# Patient Record
Sex: Male | Born: 1990 | Race: Black or African American | Hispanic: No | Marital: Single | Smoking: Current every day smoker
Health system: Southern US, Community
[De-identification: ages and names within clinical notes are randomized; demographics above are authoritative.]

## PROBLEM LIST (undated history)

## (undated) DIAGNOSIS — F32A Depression, unspecified: Secondary | ICD-10-CM

## (undated) DIAGNOSIS — F319 Bipolar disorder, unspecified: Secondary | ICD-10-CM

## (undated) DIAGNOSIS — J45909 Unspecified asthma, uncomplicated: Secondary | ICD-10-CM

## (undated) DIAGNOSIS — J189 Pneumonia, unspecified organism: Secondary | ICD-10-CM

## (undated) DIAGNOSIS — F329 Major depressive disorder, single episode, unspecified: Secondary | ICD-10-CM

## (undated) HISTORY — PX: NO PAST SURGERIES: SHX2092

---
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
## 2010-08-07 LAB — URINALYSIS, ROUTINE W REFLEX MICROSCOPIC
Bilirubin Urine: NEGATIVE
Hgb urine dipstick: NEGATIVE
Ketones, ur: NEGATIVE mg/dL
Protein, ur: 100 mg/dL — AB
Urobilinogen, UA: 1 mg/dL (ref 0.0–1.0)

## 2010-08-07 LAB — RPR: RPR Ser Ql: NONREACTIVE

## 2011-02-02 LAB — RAPID STREP SCREEN (MED CTR MEBANE ONLY): Streptococcus, Group A Screen (Direct): NEGATIVE

## 2014-01-12 ENCOUNTER — Emergency Department (HOSPITAL_COMMUNITY): Payer: Self-pay

## 2014-01-12 ENCOUNTER — Emergency Department (HOSPITAL_COMMUNITY)
Admission: EM | Admit: 2014-01-12 | Discharge: 2014-01-12 | Disposition: A | Discharge status: Other Acute Inpatient Hospital | Payer: Self-pay

## 2014-01-12 ENCOUNTER — Emergency Department (HOSPITAL_COMMUNITY)
Admission: EM | Admit: 2014-01-12 | Discharge: 2014-01-14 | Disposition: A | Discharge status: Other Acute Inpatient Hospital | Payer: Federal, State, Local not specified - Other | Attending: Emergency Medicine | Admitting: Emergency Medicine

## 2014-01-12 ENCOUNTER — Encounter (HOSPITAL_COMMUNITY): Payer: Self-pay | Admitting: Emergency Medicine

## 2014-01-12 DIAGNOSIS — Z880 Allergy status to penicillin: Secondary | ICD-10-CM | POA: Insufficient documentation

## 2014-01-12 DIAGNOSIS — F1910 Other psychoactive substance abuse, uncomplicated: Secondary | ICD-10-CM | POA: Insufficient documentation

## 2014-01-12 DIAGNOSIS — F17200 Nicotine dependence, unspecified, uncomplicated: Secondary | ICD-10-CM | POA: Insufficient documentation

## 2014-01-12 DIAGNOSIS — Z008 Encounter for other general examination: Secondary | ICD-10-CM | POA: Insufficient documentation

## 2014-01-12 DIAGNOSIS — IMO0002 Reserved for concepts with insufficient information to code with codable children: Secondary | ICD-10-CM | POA: Insufficient documentation

## 2014-01-12 DIAGNOSIS — T07XXXA Unspecified multiple injuries, initial encounter: Secondary | ICD-10-CM

## 2014-01-12 DIAGNOSIS — R45851 Suicidal ideations: Secondary | ICD-10-CM | POA: Insufficient documentation

## 2014-01-12 DIAGNOSIS — J45909 Unspecified asthma, uncomplicated: Secondary | ICD-10-CM | POA: Insufficient documentation

## 2014-01-12 HISTORY — DX: Unspecified asthma, uncomplicated: J45.909

## 2014-01-12 LAB — COMPREHENSIVE METABOLIC PANEL
ALBUMIN: 4.4 g/dL (ref 3.5–5.2)
ALK PHOS: 44 U/L (ref 39–117)
ALT: 24 U/L (ref 0–53)
ANION GAP: 16 — AB (ref 5–15)
AST: 36 U/L (ref 0–37)
BILIRUBIN TOTAL: 1.2 mg/dL (ref 0.3–1.2)
BUN: 13 mg/dL (ref 6–23)
CO2: 22 mEq/L (ref 19–32)
CREATININE: 1 mg/dL (ref 0.50–1.35)
Calcium: 9 mg/dL (ref 8.4–10.5)
Chloride: 99 mEq/L (ref 96–112)
GFR calc non Af Amer: >90 mL/min (ref >90)
GLUCOSE: 92 mg/dL (ref 70–99)
POTASSIUM: 3.7 meq/L (ref 3.7–5.3)
Sodium: 137 mEq/L (ref 137–147)
TOTAL PROTEIN: 7.8 g/dL (ref 6.0–8.3)

## 2014-01-12 LAB — CBC
HEMATOCRIT: 42 % (ref 39.0–52.0)
HEMOGLOBIN: 14.9 g/dL (ref 13.0–17.0)
MCH: 32.3 pg (ref 26.0–34.0)
MCHC: 35.5 g/dL (ref 30.0–36.0)
MCV: 90.9 fL (ref 78.0–100.0)
Platelets: 272 10*3/uL (ref 150–400)
RBC: 4.62 MIL/uL (ref 4.22–5.81)
RDW: 13.8 % (ref 11.5–15.5)
WBC: 14.5 10*3/uL — ABNORMAL HIGH (ref 4.0–10.5)

## 2014-01-12 LAB — ETHANOL

## 2014-01-12 LAB — RAPID URINE DRUG SCREEN, HOSP PERFORMED
Amphetamines: NOT DETECTED
BARBITURATES: NOT DETECTED
Benzodiazepines: NOT DETECTED
COCAINE: POSITIVE — AB
Opiates: NOT DETECTED
TETRAHYDROCANNABINOL: POSITIVE — AB

## 2014-01-12 LAB — ACETAMINOPHEN LEVEL: Acetaminophen (Tylenol), Serum: <15 ug/mL (ref 10–30)

## 2014-01-12 LAB — SALICYLATE LEVEL: Salicylate Lvl: <2 mg/dL — ABNORMAL LOW (ref 2.8–20.0)

## 2014-01-12 MED ORDER — HYDROCODONE-ACETAMINOPHEN 5-325 MG PO TABS
1.0000 | ORAL_TABLET | Freq: Once | ORAL | Status: AC
  Administered 2014-01-12: 1 via ORAL
  Filled 2014-01-12: qty 1

## 2014-01-12 MED ORDER — LORAZEPAM 1 MG PO TABS
1.0000 mg | ORAL_TABLET | Freq: Three times a day (TID) | ORAL | Status: DC | PRN
Start: 2014-01-12 — End: 2014-01-14

## 2014-01-12 MED ORDER — TETANUS-DIPHTH-ACELL PERTUSSIS 5-2.5-18.5 LF-MCG/0.5 IM SUSP
0.5000 mL | Freq: Once | INTRAMUSCULAR | Status: AC
  Administered 2014-01-12: 0.5 mL via INTRAMUSCULAR
  Filled 2014-01-12: qty 0.5

## 2014-01-12 MED ORDER — ACETAMINOPHEN 325 MG PO TABS
650.0000 mg | ORAL_TABLET | ORAL | Status: DC | PRN
  Administered 2014-01-12: 650 mg via ORAL
  Filled 2014-01-12: qty 2

## 2014-01-12 MED ORDER — NICOTINE 21 MG/24HR TD PT24
21.0000 mg | MEDICATED_PATCH | Freq: Every day | TRANSDERMAL | Status: DC
  Administered 2014-01-12 – 2014-01-14 (×3): 21 mg via TRANSDERMAL
  Filled 2014-01-12 (×3): qty 1

## 2014-01-12 MED ORDER — TETRACAINE HCL 0.5 % OP SOLN
2.0000 [drp] | Freq: Once | OPHTHALMIC | Status: AC
Start: 2014-01-12 — End: 2014-01-12
  Administered 2014-01-12: 2 [drp] via OPHTHALMIC
  Filled 2014-01-12: qty 2

## 2014-01-12 MED ORDER — ALUM & MAG HYDROXIDE-SIMETH 200-200-20 MG/5ML PO SUSP: 30.0000 mL | ORAL | Status: DC | PRN

## 2014-01-12 MED ORDER — ONDANSETRON HCL 4 MG PO TABS: 4.0000 mg | ORAL_TABLET | Freq: Three times a day (TID) | ORAL | Status: DC | PRN

## 2014-01-12 MED ORDER — ZOLPIDEM TARTRATE 5 MG PO TABS
5.0000 mg | ORAL_TABLET | Freq: Every evening | ORAL | Status: DC | PRN
  Administered 2014-01-12: 5 mg via ORAL
  Filled 2014-01-12: qty 1

## 2014-01-12 MED ORDER — IBUPROFEN 400 MG PO TABS
600.0000 mg | ORAL_TABLET | Freq: Three times a day (TID) | ORAL | Status: DC | PRN
  Administered 2014-01-13 – 2014-01-14 (×3): 600 mg via ORAL
  Filled 2014-01-12 (×6): qty 1

## 2014-01-12 MED ORDER — FLUORESCEIN SODIUM 1 MG OP STRP
1.0000 | ORAL_STRIP | Freq: Once | OPHTHALMIC | Status: AC
  Administered 2014-01-12: 1 via OPHTHALMIC
  Filled 2014-01-12: qty 1

## 2014-01-12 NOTE — ED Notes (Signed)
Pt in paper scrubs, wanded by security at triage and called for a sitter.
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
## 2014-01-12 NOTE — ED Provider Notes (Signed)
Medical screening examination/treatment/procedure(s) were performed by non-physician practitioner and as supervising physician I was immediately available for consultation/collaboration.   EKG Interpretation None        Brit Carbonell, MD 01/12/14 2334 

## 2014-01-13 NOTE — BH Assessment (Signed)
Assessment completed. Inpatient treatment is recommended. Fayrene Helper, PA-C has been notified of the recommendation.

## 2014-01-13 NOTE — BH Assessment (Signed)
Referrals faxed over for possible admission [facility]   The Following facilities were at Capacity:   [facility]- Per Tretha Sciara- Left Message   [facility]- Per Selena Batten   [facility]- Per Marlou [facility]- Per [facility]- Per Missy   Sharyne Richters- Per [facility]- Per [facility]- Per [facility]- Per Fredric Dine- Per Mary[facility]   [facility]- Per [facility]- Per Camie Patience-  Per [facility]- Per Sherron Monday- Per [facility]- Per Erasmo Score- Per Sherald Barge- Per Amy   [place]- [facility]- No Answer   [place]- Per [employer]
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
## 2014-01-13 NOTE — BH Assessment (Addendum)
This writer attempted F/U at the following facilities:   [facility]-left message on crisis line for someone to call TTS back regarding the status of referral faxed prior.   [facility]-Per Joni Reining pt does not meet criteria as pt is currently endorsing SI,HI, and AVH as noted in TTS assessment. [facility] is a SA treatment facility and do not accept patients whom are SI,HI, or endorsing AVH.  Patient is being reviewed by [facility] at [facility] for possible admission.  Currently no inpatient bed placement available.  LCSW will follow up.     Glorious Peach, MS, LCASA  Assessment Counselor   Mordecai Rasmussen, [place], MSW

## 2014-01-14 ENCOUNTER — Inpatient Hospital Stay (HOSPITAL_COMMUNITY)
Admission: EM | Admit: 2014-01-14 | Discharge: 2014-01-21 | DRG: 885 | Disposition: A | Discharge status: Home / Self Care | Payer: Federal, State, Local not specified - Other | Source: Intra-hospital | Attending: Psychiatry | Admitting: Psychiatry

## 2014-01-14 DIAGNOSIS — F329 Major depressive disorder, single episode, unspecified: Secondary | ICD-10-CM | POA: Diagnosis present

## 2014-01-14 DIAGNOSIS — Z23 Encounter for immunization: Secondary | ICD-10-CM

## 2014-01-14 DIAGNOSIS — R45851 Suicidal ideations: Secondary | ICD-10-CM

## 2014-01-14 DIAGNOSIS — F411 Generalized anxiety disorder: Secondary | ICD-10-CM | POA: Diagnosis present

## 2014-01-14 DIAGNOSIS — G4700 Insomnia, unspecified: Secondary | ICD-10-CM | POA: Diagnosis present

## 2014-01-14 DIAGNOSIS — IMO0002 Reserved for concepts with insufficient information to code with codable children: Secondary | ICD-10-CM | POA: Diagnosis present

## 2014-01-14 DIAGNOSIS — F1410 Cocaine abuse, uncomplicated: Secondary | ICD-10-CM | POA: Diagnosis present

## 2014-01-14 DIAGNOSIS — J45909 Unspecified asthma, uncomplicated: Secondary | ICD-10-CM | POA: Diagnosis present

## 2014-01-14 DIAGNOSIS — F3162 Bipolar disorder, current episode mixed, moderate: Principal | ICD-10-CM | POA: Diagnosis present

## 2014-01-14 DIAGNOSIS — R45850 Homicidal ideations: Secondary | ICD-10-CM

## 2014-01-14 DIAGNOSIS — F1210 Cannabis abuse, uncomplicated: Secondary | ICD-10-CM | POA: Diagnosis present

## 2014-01-14 DIAGNOSIS — F1910 Other psychoactive substance abuse, uncomplicated: Secondary | ICD-10-CM

## 2014-01-14 DIAGNOSIS — F1994 Other psychoactive substance use, unspecified with psychoactive substance-induced mood disorder: Secondary | ICD-10-CM

## 2014-01-14 DIAGNOSIS — F17200 Nicotine dependence, unspecified, uncomplicated: Secondary | ICD-10-CM | POA: Diagnosis present

## 2014-01-14 DIAGNOSIS — F332 Major depressive disorder, recurrent severe without psychotic features: Secondary | ICD-10-CM

## 2014-01-14 NOTE — ED Provider Notes (Signed)
Accepted by Dr. Elna Breslow at [facility]  Vida Roller, MD 01/14/14 2012
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
## 2014-01-14 NOTE — ED Notes (Signed)
PSYCH EVAL IN PROGRESS

## 2014-01-15 ENCOUNTER — Encounter (HOSPITAL_COMMUNITY): Payer: Self-pay

## 2014-01-15 DIAGNOSIS — R45850 Homicidal ideations: Secondary | ICD-10-CM

## 2014-01-15 DIAGNOSIS — F329 Major depressive disorder, single episode, unspecified: Secondary | ICD-10-CM | POA: Diagnosis present

## 2014-01-15 DIAGNOSIS — R45851 Suicidal ideations: Secondary | ICD-10-CM

## 2014-01-15 MED ORDER — BENZTROPINE MESYLATE 0.5 MG PO TABS
0.5000 mg | ORAL_TABLET | Freq: Two times a day (BID) | ORAL | Status: DC
  Administered 2014-01-15 – 2014-01-17 (×4): 0.5 mg via ORAL
  Filled 2014-01-15 (×8): qty 1

## 2014-01-15 MED ORDER — ALBUTEROL SULFATE HFA 108 (90 BASE) MCG/ACT IN AERS: 2.0000 | INHALATION_SPRAY | RESPIRATORY_TRACT | Status: DC | PRN

## 2014-01-15 MED ORDER — DIVALPROEX SODIUM ER 250 MG PO TB24
750.0000 mg | ORAL_TABLET | Freq: Every evening | ORAL | Status: DC
  Administered 2014-01-15: 750 mg via ORAL
  Filled 2014-01-15 (×2): qty 3

## 2014-01-15 MED ORDER — PNEUMOCOCCAL VAC POLYVALENT 25 MCG/0.5ML IJ INJ
0.5000 mL | INJECTION | INTRAMUSCULAR | Status: AC
  Administered 2014-01-16: 0.5 mL via INTRAMUSCULAR

## 2014-01-15 MED ORDER — PRAZOSIN HCL 1 MG PO CAPS
1.0000 mg | ORAL_CAPSULE | Freq: Every day | ORAL | Status: DC
  Administered 2014-01-16 – 2014-01-20 (×5): 1 mg via ORAL
  Filled 2014-01-15 (×7): qty 1

## 2014-01-15 MED ORDER — HALOPERIDOL 5 MG PO TABS
5.0000 mg | ORAL_TABLET | Freq: Two times a day (BID) | ORAL | Status: DC
  Administered 2014-01-15 – 2014-01-16 (×2): 5 mg via ORAL
  Filled 2014-01-15 (×4): qty 1

## 2014-01-15 MED ORDER — MAGNESIUM HYDROXIDE 400 MG/5ML PO SUSP: 30.0000 mL | Freq: Every day | ORAL | Status: DC | PRN

## 2014-01-15 MED ORDER — ALUM & MAG HYDROXIDE-SIMETH 200-200-20 MG/5ML PO SUSP: 30.0000 mL | ORAL | Status: DC | PRN

## 2014-01-15 MED ORDER — BACITRACIN-NEOMYCIN-POLYMYXIN 400-5-5000 EX OINT
TOPICAL_OINTMENT | Freq: Two times a day (BID) | CUTANEOUS | Status: DC
  Administered 2014-01-15: 15 via TOPICAL
  Administered 2014-01-15: 1 via TOPICAL
  Administered 2014-01-16: 15 via TOPICAL
  Administered 2014-01-17 – 2014-01-20 (×4): via TOPICAL
  Filled 2014-01-15 (×15): qty 1

## 2014-01-15 MED ORDER — IBUPROFEN 200 MG PO TABS
600.0000 mg | ORAL_TABLET | Freq: Four times a day (QID) | ORAL | Status: DC | PRN
  Administered 2014-01-16 – 2014-01-20 (×4): 600 mg via ORAL
  Filled 2014-01-15 (×4): qty 3

## 2014-01-15 MED ORDER — TRAZODONE HCL 50 MG PO TABS
50.0000 mg | ORAL_TABLET | Freq: Every evening | ORAL | Status: DC | PRN
  Administered 2014-01-15 – 2014-01-20 (×10): 50 mg via ORAL
  Filled 2014-01-15 (×16): qty 1

## 2014-01-15 MED ORDER — TRIAMCINOLONE ACETONIDE 0.1 % MT PSTE
PASTE | Freq: Three times a day (TID) | OROMUCOSAL | Status: DC
  Administered 2014-01-15 – 2014-01-18 (×6): via OROMUCOSAL
  Filled 2014-01-15: qty 5

## 2014-01-15 MED ORDER — ACETAMINOPHEN 325 MG PO TABS
650.0000 mg | ORAL_TABLET | Freq: Four times a day (QID) | ORAL | Status: DC | PRN
  Administered 2014-01-15 – 2014-01-20 (×4): 650 mg via ORAL
  Filled 2014-01-15 (×4): qty 2

## 2014-01-15 MED ORDER — TRAZODONE HCL 50 MG PO TABS
50.0000 mg | ORAL_TABLET | Freq: Every evening | ORAL | Status: DC | PRN
Start: 2014-01-15 — End: 2014-01-15

## 2014-01-15 MED ORDER — HYDROXYZINE HCL 25 MG PO TABS
25.0000 mg | ORAL_TABLET | Freq: Four times a day (QID) | ORAL | Status: DC | PRN
Start: 2014-01-15 — End: 2014-01-21
  Administered 2014-01-15 – 2014-01-20 (×5): 25 mg via ORAL
  Filled 2014-01-15 (×5): qty 1

## 2014-01-15 NOTE — Progress Notes (Signed)
Did not attend group 
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
## 2014-01-15 NOTE — [facility] Group Notes (Signed)
[facility] LCSW Group Therapy  01/15/2014 1:56 PM  Type of Therapy:  Group Therapy  Participation Level:  Active  Participation Quality:  Appropriate  Affect:  Depressed  Cognitive:  Alert  Insight:  Limited  Engagement in Therapy: Limited  Modes of Intervention:  Socialization, Support, Education  Summary of Progress/Problems:Mental Health Association (MHA) speaker came to talk about his personal journey with substance abuse and mental illness. Group members were challenged to process ways by which to relate to the speaker. MHA speaker provided handouts and educational information pertaining to groups and services offered by the [facility]. Pt appeared to be engaged during the presentation. He discussed musical instruments with the presenter.     Hyatt,Candace 01/15/2014, 1:56 PM

## 2014-01-15 NOTE — Tx Team (Addendum)
Initial Interdisciplinary Treatment Plan   PATIENT STRESSORS: Marital or family conflict Substance abuse Financial Problems, legal issues   PROBLEM LIST: Problem List/Patient Goals Date to be addressed Date deferred Reason deferred Estimated date of resolution  Risk for suicide 01/15/14     [facility] 01/15/14                                                DISCHARGE CRITERIA:  Medical problems require only outpatient monitoring  PRELIMINARY DISCHARGE PLAN: Attend aftercare/continuing care group  PATIENT/FAMIILY INVOLVEMENT: This treatment plan has been presented to and reviewed with the patient, Raymond Church.  The patient and family have been given the opportunity to ask questions and make suggestions.  Jacques Navy A 01/15/2014, 5:10 AM
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
## 2014-01-15 NOTE — Progress Notes (Signed)
D:  Per pt self inventory pt reports sleeping fair, appetite good, energy level high, ability to pay attention good, rates depression at a 4 out of 10 and hopelessness at an 8 out of 10 denies SI, endorses HI and AH--MD aware and started pt on Risperdal/Cogentin, Pt set a goal for himself today to "tell myself to keep pushing"     A:  Emotional support provided, Encouraged pt to continue with treatment plan and attend all group activities, q15 min checks maintained for safety.  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

## 2014-01-15 NOTE — BHH Suicide Risk Assessment (Signed)
   Nursing information obtained from:    Demographic factors:    Current Mental Status:    Loss Factors:    Historical Factors:    Risk Reduction Factors:    Total Time spent with patient: 20 minutes  CLINICAL FACTORS:   Depression:   Comorbid alcohol abuse/dependence Hopelessness Impulsivity Insomnia Alcohol/Substance Abuse/Dependencies Currently Psychotic  Psychiatric Specialty Exam: Physical Exam  Nursing note and vitals reviewed. Constitutional: He is oriented to person, place, and time. He appears well-developed and well-nourished.  HENT:  Head: Normocephalic.  Eyes: Conjunctivae are normal. Pupils are equal, round, and reactive to light.  Neck: Normal range of motion.  Cardiovascular: Normal rate.   Respiratory: Effort normal.  GI: Soft.  Musculoskeletal: Normal range of motion.  Neurological: He is alert and oriented to person, place, and time.  Skin: Skin is warm.  Psychiatric: His speech is normal. His mood appears anxious. His affect is angry and labile. He is actively hallucinating. Thought content is paranoid. Cognition and memory are normal. He expresses impulsivity. He exhibits a depressed mood. He expresses homicidal and suicidal ideation.    Review of Systems  Constitutional: Negative.   HENT: Negative.   Eyes: Negative.   Respiratory: Negative.   Cardiovascular: Negative.   Gastrointestinal: Negative.   Genitourinary: Negative.   Musculoskeletal: Negative.   Skin: Negative.   Neurological: Negative.   Psychiatric/Behavioral: Positive for depression, suicidal ideas, hallucinations and substance abuse. The patient is nervous/anxious and has insomnia.     Blood pressure 110/68, pulse 95, temperature 97.6 F (36.4 C), temperature source Oral, resp. rate 20, height 5' 6.75" (1.695 m), weight 61.689 kg (136 lb).Body mass index is 21.47 kg/(m^2).   For mental status examination -please see H&P    COGNITIVE FEATURES THAT CONTRIBUTE TO RISK:   Closed-mindedness Polarized thinking Thought constriction (tunnel vision)    SUICIDE RISK:   Moderate:  Frequent suicidal ideation with limited intensity, and duration, some specificity in terms of plans, no associated intent, good self-control, limited dysphoria/symptomatology, some risk factors present, and identifiable protective factors, including available and accessible social support.  PLAN OF CARE:  I certify that inpatient services furnished can reasonably be expected to improve the patient's condition.  Elyan Vanwieren 01/15/2014, 1:10 PM

## 2014-01-15 NOTE — BHH Group Notes (Signed)
[facility] LCSW Aftercare Discharge Planning Group Note   01/15/2014 12:48 PM  Participation Quality:  Attended, but no interaction as he was called out to see Dr prior to sharing.  Was not disruptive.    Daryel Gerald B
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
## 2014-01-16 DIAGNOSIS — F3160 Bipolar disorder, current episode mixed, unspecified: Secondary | ICD-10-CM

## 2014-01-16 MED ORDER — HALOPERIDOL 5 MG PO TABS
10.0000 mg | ORAL_TABLET | Freq: Two times a day (BID) | ORAL | Status: DC
  Administered 2014-01-16 – 2014-01-21 (×9): 10 mg via ORAL
  Filled 2014-01-16 (×12): qty 2

## 2014-01-16 MED ORDER — DIVALPROEX SODIUM ER 500 MG PO TB24
1000.0000 mg | ORAL_TABLET | Freq: Every evening | ORAL | Status: DC
  Administered 2014-01-16 – 2014-01-20 (×5): 1000 mg via ORAL
  Filled 2014-01-16 (×6): qty 2

## 2014-01-16 MED ORDER — NICOTINE 21 MG/24HR TD PT24
21.0000 mg | MEDICATED_PATCH | Freq: Every day | TRANSDERMAL | Status: DC
  Administered 2014-01-16 – 2014-01-20 (×5): 21 mg via TRANSDERMAL
  Filled 2014-01-16 (×7): qty 1

## 2014-01-16 NOTE — Plan of Care (Signed)
Problem: Alteration in mood; excessive anxiety as evidenced by: Goal: STG-Pt will report an absence of self-harm thoughts/actions (Patient will report an absence of self-harm thoughts or actions)  Outcome: Progressing Pt slept well through the night and denies thoughts of self harm.

## 2014-01-16 NOTE — Progress Notes (Signed)
Patient ID: Raymond Church, male   DOB: [birth year], 23 y.o.   MRN: [id number] D: Patient sleeping since beginning of shift. Pt did not come for medication. Writer try to wake pt but refuse to wake up. Respiration regular and unlabored A: 15 mins checks for safety. R: Patient  is safe.

## 2014-01-16 NOTE — Consult Note (Signed)
Case discussed, and agree with plan 

## 2014-01-16 NOTE — BHH Group Notes (Signed)
Adult Psychoeducational Group Note  Date:  01/16/2014 Time:  11:03 AM  Group Topic/Focus:  Self Care:   The focus of this group is to help patients understand the importance of self-care in order to improve or restore emotional, physical, spiritual, interpersonal, and financial health.  Participation Level:  Did Not Attend  Alfonse Spruce 01/16/2014, 11:03 AM

## 2014-01-16 NOTE — Progress Notes (Signed)
D: Patient endorses passive SI and AH but contracts for safety.  He appears anxious but is flat and minimally interactive this afternoon.  Pt. Is pleasant during med pass but forwards little.  He is otherwise pleasant and cooperative with staff and others on the unit.  A: Patient given emotional support from RN. Patient encouraged to come to staff with concerns and/or questions. Patient's medication routine continued. Patient's orders and plan of care reviewed.   R: Patient remains appropriate and cooperative. Will continue to monitor patient q15 minutes for safety.

## 2014-01-16 NOTE — Progress Notes (Signed)
Adult Psychoeducational Group Note  Date:  01/16/2014 Time:  10:33 PM  Group Topic/Focus:  Wrap-Up Group:   The focus of this group is to help patients review their daily goal of treatment and discuss progress on daily workbooks.  Participation Level:  Did Not Attend  Participation Quality:  Drowsy  Affect:  Flat  Cognitive:  Confused  Insight: None  Engagement in Group:  None  Modes of Intervention:  None  Additional Comments:  Pt was not a group  Lakelyn Straus R 01/16/2014, 10:33 PM
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
## 2014-01-17 MED ORDER — DIPHENHYDRAMINE HCL 50 MG/ML IJ SOLN
50.0000 mg | Freq: Once | INTRAMUSCULAR | Status: AC
  Administered 2014-01-17: 50 mg via INTRAMUSCULAR
  Filled 2014-01-17: qty 1

## 2014-01-17 MED ORDER — BENZTROPINE MESYLATE 2 MG PO TABS
2.0000 mg | ORAL_TABLET | Freq: Two times a day (BID) | ORAL | Status: DC
  Administered 2014-01-17 – 2014-01-21 (×7): 2 mg via ORAL
  Filled 2014-01-17 (×9): qty 1

## 2014-01-17 MED ORDER — BENZTROPINE MESYLATE 1 MG PO TABS
ORAL_TABLET | ORAL | Status: AC
  Filled 2014-01-17: qty 2

## 2014-01-17 MED ORDER — DIPHENHYDRAMINE HCL 50 MG/ML IJ SOLN
INTRAMUSCULAR | Status: AC
Start: 2014-01-17 — End: 2014-01-17
  Administered 2014-01-17: 50 mg via INTRAMUSCULAR
  Filled 2014-01-17: qty 1
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
## 2014-01-17 NOTE — [facility] Group Notes (Signed)
[facility] LCSW Group Therapy  01/17/2014 2:14 PM  Type of Therapy:  Group Therapy  Participation Level: Invited.  Did Not Attend :  Raymond Church,Raymond Church 01/17/2014, 2:14 PM

## 2014-01-17 NOTE — [facility] Group Notes (Signed)
[facility] LCSW Aftercare Discharge Planning Group Note   01/17/2014 10:15 AM  Participation Quality:  Invited  Chose to not attend "I feel like I am going to fall whenever I get up."    [place], Raymond Church

## 2014-01-17 NOTE — Progress Notes (Signed)
Patient ID: Raymond Church, male   DOB: 1990-12-17, 23 y.o.   MRN: [id number] D. Patient presents with depressed mood, affect blunted. Raymond Church reports that he ''feels better. '' He denies any current AH/VH but remains depressed, isolative and withdrawn on the unit. He has been in his bed throughout most of shift thus far, only leaving room to come to medication window. He did report feeling dizzy with low bp this am - he was encouraged to drink fluids and gatorade provided. He refused to complete self inventory today. A. Medications given as ordered. Support and encouragement provided. Discussed above information with Dr. Elna Breslow. R. Patient is safe on the unit. He denies any acute concerns at this time and appears in no acute distress. Will continue to monitor q 15 minutes for safety at this time.

## 2014-01-17 NOTE — Progress Notes (Signed)
Patient ID: Raymond Church, male   DOB: [birth year], 23 y.o.   MRN: [id number] Pt presented to medication window with very stiff jaw - unable to open mouth with altered speech stating '' my jaw is so tight in my muscles, I can't open my mouth. whats wrong with me.'' Pt with suspected EPS reaction . Notified Dr. Jannifer Franklin of above information and RN assessment/patient reports. Orders received for benadryl  IM once, and increase cogentin to 2 mg BID. Orders also received to delay scheduled dose of haldol and depakote at this time to move to bedtime due to suspected EPS. Patient accepted injection, educated to notify staff if no relief. Will continue to monitor q 15 minutes for safety.

## 2014-01-17 NOTE — Progress Notes (Signed)
Patient ID: Raymond Church, male   DOB: June 09, 1990, 23 y.o.   MRN: [id number] D: Pt denies SI/HI/AVH. Pt stated he feels tired and does not want to be bothered. Pt mood and affect appeared depressed and flat. Pt sleeping most of the evening but came out for snack and medication.  Cooperative with assessment. No acute distressed noted at this time.   A: Met with pt 1:1. Medications administered as prescribed. Writer encouraged pt to discuss feelings. Pt encouraged to come to staff with any question or concerns.   R: Patient remains safe. He is complaint with medications and denies any adverse reaction. Continue current POC.

## 2014-01-17 NOTE — Plan of Care (Signed)
Problem: Diagnosis: Increased Risk For Suicide Attempt Goal: STG-Patient Will Report Suicidal Feelings to Staff Outcome: Progressing Pt denies suicidal ideation
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
## 2014-01-17 NOTE — Progress Notes (Signed)
[facility] Group Notes:  (Nursing/MHT/Case Management/Adjunct)  Date:  01/17/2014  Time:  9:34 PM  Type of Therapy:  Group Therapy  Participation Level:  Active  Participation Quality:  Appropriate and Sharing  Affect:  Appropriate  Cognitive:  Appropriate  Insight:  Appropriate  Engagement in Group:  Engaged  Modes of Intervention:  Socialization and Support  Summary of Progress/Problems: Pt. Stated play musical instruments (durms) and interacting with pets.  Pt. Stated his support system is his family children for relapse prevention.  Sondra Come 01/17/2014, 9:34 PM

## 2014-01-18 DIAGNOSIS — J45909 Unspecified asthma, uncomplicated: Secondary | ICD-10-CM

## 2014-01-18 DIAGNOSIS — F1410 Cocaine abuse, uncomplicated: Secondary | ICD-10-CM

## 2014-01-18 DIAGNOSIS — F17200 Nicotine dependence, unspecified, uncomplicated: Secondary | ICD-10-CM

## 2014-01-18 DIAGNOSIS — F3162 Bipolar disorder, current episode mixed, moderate: Principal | ICD-10-CM

## 2014-01-18 DIAGNOSIS — F1210 Cannabis abuse, uncomplicated: Secondary | ICD-10-CM

## 2014-01-18 NOTE — Progress Notes (Signed)
Patient ID: Raymond Church, male   DOB: Jul 10, 1990, 23 y.o.   MRN: [id number] [facility] MD Progress Note  01/18/2014 3:38 PM KOICHI PLATTE  MRN:  [id number] Subjective: Patient states  Raymond Church reports, "I'm feeling fine. When can I get out of here"  Objective: Patient seen and chart reviewed. Raymond Church says he is feeling well. Says his discharge plan is to maintain mood stability by staying focus. He denies SI/HI today. He is compliant on medications. Denies side effects. Plays the piano.  Per staff patient has been attending groups.   Diagnosis:   DSM5: Primary psychiatric diagnosis:  Bipolar disorder ,Type I , current episode ,with  Mixed features,moderate   Secondary psychiatric diagnosis:  Stimulant (cocaine) use disorder  Marijuana use disorder  Tobacco use disorder   Non psychiatric diagnosis:  Asthma  Total Time spent with patient: 30 minutes   ADL's:  Impaired  Sleep: Fair  Appetite:  Fair  Psychiatric Specialty Exam: Physical Exam  Constitutional: He is oriented to person, place, and time. He appears well-developed and well-nourished.  HENT:  Head: Normocephalic and atraumatic.  Eyes: Conjunctivae are normal. Pupils are equal, round, and reactive to light.  Neck: Normal range of motion. Neck supple.  Cardiovascular: Normal rate and regular rhythm.   Respiratory: Effort normal.  GI: Soft.  Musculoskeletal: Normal range of motion.  Neurological: He is alert and oriented to person, place, and time.  Skin: Skin is warm.  Psychiatric: His speech is normal. His mood appears anxious. Cognition and memory are normal. He expresses impulsivity. He exhibits a depressed mood. He expresses no homicidal and no suicidal ideation.    Review of Systems  Constitutional: Negative.   HENT: Negative.   Eyes: Negative.   Respiratory: Negative.   Cardiovascular: Negative.   Gastrointestinal: Negative.   Genitourinary: Negative.   Musculoskeletal: Positive for myalgias (improving).   Skin: Negative.   Neurological: Negative.   Psychiatric/Behavioral: Positive for depression and substance abuse. Negative for suicidal ideas. The patient is nervous/anxious.     Blood pressure 103/54, pulse 110, temperature 97.6 F (36.4 C), temperature source Oral, resp. rate 18, height 5' 6.75" (1.695 m), weight 61.689 kg (136 lb), SpO2 100.00%.Body mass index is 21.47 kg/(m^2).  General Appearance: Fairly Groomed  Patent attorney::  Fair  Speech:  Normal Rate  Volume:  Normal  Mood:  Anxious, Depressed and Hopeless  Affect:  Labile  Thought Process:  Goal Directed  Orientation:  Full (Time, Place, and Person)  Thought Content:  Hallucinations: Auditory  Suicidal Thoughts:  No  Homicidal Thoughts:  No  Memory:  Immediate;   Good Recent;   Good Remote;   Good  Judgement:  Impaired  Insight:  Lacking  Psychomotor Activity:  Normal  Concentration:  Good  Recall:  Fair  Fund of Knowledge:Fair  Language: Fair  Akathisia:  No    AIMS (if indicated):   0  Assets:  Communication Skills Desire for Improvement  Sleep:  Number of Hours: 6.75   Musculoskeletal: Strength & Muscle Tone: within normal limits Gait & Station: normal Patient leans: [name]  Current Medications: Current Facility-Administered Medications  Medication Dose Route Frequency Provider Last Rate Last Dose  . acetaminophen (TYLENOL) tablet 650 mg  650 mg Oral Q6H PRN Kerry Hough, PA-C   650 mg at 01/15/14 0127  . albuterol (PROVENTIL HFA;VENTOLIN HFA) 108 (90 BASE) MCG/ACT inhaler 2 puff  2 puff Inhalation Q4H PRN Jomarie Longs, MD      . alum & mag  hydroxide-simeth (MAALOX/MYLANTA) 200-200-20 MG/5ML suspension 30 mL  30 mL Oral Q4H PRN Kerry Hough, PA-C      . benztropine (COGENTIN) tablet 2 mg  2 mg Oral BID Mojeed Akintayo   2 mg at 01/18/14 1332  . divalproex (DEPAKOTE ER) 24 hr tablet 1,000 mg  1,000 mg Oral QPM Saramma Eappen, MD   1,000 mg at 01/17/14 2216  . haloperidol (HALDOL) tablet 10 mg  10 mg  Oral BID Jomarie Longs, MD   10 mg at 01/18/14 1332  . hydrOXYzine (ATARAX/VISTARIL) tablet 25 mg  25 mg Oral Q6H PRN Kerry Hough, PA-C   25 mg at 01/15/14 1653  . ibuprofen (ADVIL,MOTRIN) tablet 600 mg  600 mg Oral Q6H PRN Kerry Hough, PA-C   600 mg at 01/18/14 0029  . magnesium hydroxide (MILK OF MAGNESIA) suspension 30 mL  30 mL Oral Daily PRN Kerry Hough, PA-C      . neomycin-bacitracin-polymyxin (NEOSPORIN) ointment   Topical BID Kerry Hough, PA-C      . nicotine (NICODERM CQ - dosed in mg/24 hours) patch 21 mg  21 mg Transdermal Daily Saramma Eappen, MD   21 mg at 01/18/14 1331  . prazosin (MINIPRESS) capsule 1 mg  1 mg Oral QHS Jomarie Longs, MD   1 mg at 01/17/14 2216  . traZODone (DESYREL) tablet 50 mg  50 mg Oral QHS,MR X 1 Spencer E Simon, PA-C   50 mg at 01/18/14 0030  . triamcinolone (KENALOG) 0.1 % paste   Mouth/Throat TID Sanjuana Kava, NP        Lab Results: No results found for this or any previous visit (from the past 48 hour(s)).  Physical Findings: AIMS: Facial and Oral Movements Muscles of Facial Expression: None, normal Lips and Perioral Area: None, normal Jaw: None, normal Tongue: None, normal,Extremity Movements Upper (arms, wrists, hands, fingers): None, normal Lower (legs, knees, ankles, toes): None, normal, Trunk Movements Neck, shoulders, hips: None, normal, Overall Severity Severity of abnormal movements (highest score from questions above): None, normal Incapacitation due to abnormal movements: None, normal Patient's awareness of abnormal movements (rate only patient's report): No Awareness, Dental Status Current problems with teeth and/or dentures?: Yes (cavities) Does patient usually wear dentures?: No  CIWA:  CIWA-Ar Total: 0 COWS:  COWS Total Score: 1  Treatment Plan Summary: Daily contact with patient to assess and evaluate symptoms and progress in treatment Medication management  Assessment and Plan: Continue inpatient treatment  and stabilization.  Continue current treatment pan in place. Discharge plan in progress.  Medical Decision Making Problem Points:  Established problem, stable/improving (1) and Review of last therapy session (1) Data Points:  Order Aims Assessment (2) Review of medication regiment & side effects (2)  I certify that inpatient services furnished can reasonably be expected to improve the patient's condition.   Sanjuana Kava, PMHNP 01/18/2014, 3:38 PM  I agreed with the findings, treatment and disposition plan of this patient. Kathryne Sharper, MD

## 2014-01-18 NOTE — [facility] Group Notes (Signed)
[facility] Group Notes:  (Nursing/MHT/Case Management/Adjunct)  Date:  01/18/2014  Time:  11:54 AM  Type of Therapy:  Psychoeducational Skills-Patient Self Inventory  Participation Level:  Did Not Attend  Buford Dresser 01/18/2014, 11:54 AM

## 2014-01-18 NOTE — [facility] Group Notes (Signed)
[facility] Group Notes: (Clinical Social Work)   01/18/2014      Type of Therapy:  Group Therapy   Participation Level:  Did Not Attend - refused MHT request   Ambrose Mantle, LCSW 01/18/2014, 1:21 PM

## 2014-01-18 NOTE — Progress Notes (Signed)
Patient ID: Raymond Church, male   DOB: 09-24-1990, 23 y.o.   MRN: [id number]   D: Pt has been very flat and depressed on the unit today. Pt was in the bed most of the morning, and refused to take morning medications. Pt did wake up at lunch and requested his medications, all medications were given without any problems. Pt also has a abrasion to right elbow, new dressing applied. Pt reported that he felt much better, however appeared to have some involuntary movements. Pt reported being negative SI/HI, no AH/VH noted. A: 15 min checks continued for patient safety. R: Pt safety maintained.

## 2014-01-18 NOTE — Progress Notes (Signed)
Patient ID: Raymond Church, male   DOB: 1991-04-08, 23 y.o.   MRN: [id number]  D: Pt informed the writer that he's ready to see his children. Stated he has 3 kids and the oldest is 8.  Stated he feels better today than previous days, due to his medication. However, pt stated his meds are making him feel "ansy".   A:  Support and encouragement was offered. 15 min checks continued for safety.  R: Pt remains safe.

## 2014-01-18 NOTE — [facility] Group Notes (Signed)
[facility] Group Notes:  (Nursing/MHT/Case Management/Adjunct)  Date:  01/18/2014  Time:  11:54 AM  Type of Therapy:  Psychoeducational Skills- Healthy Coping Skills  Participation Level:  Did Not Attend  Raymond Church 01/18/2014, 11:54 AM

## 2014-01-18 NOTE — Progress Notes (Signed)
Patient ID: Raymond Church, male   DOB: January 22, 1991, 23 y.o.   MRN: [id number] D: Pt denies SI/HI/AVH. Pt in dayroom playing the piano. Pt mood and affect is brighter and patient is interacting well with peers. Pt reports relieve from benadryl given earlier for suspected EPS.  Cooperative with assessment. No acute distressed noted at this time.   A: Met with pt 1:1. Medications administered as prescribed. Writer encouraged pt to discuss feelings. Pt encouraged to come to staff with any question or concerns.   R: Patient remains safe. He is complaint with medications and denies any adverse reaction. Continue current POC.

## 2014-01-18 NOTE — Progress Notes (Signed)
[facility] Group Notes:  (Nursing/MHT/Case Management/Adjunct)  Date:  01/18/2014  Time:  9:07 PM  Type of Therapy:  Psychoeducational Skills  Participation Level:  Active  Participation Quality:  Appropriate  Affect:  Appropriate  Cognitive:  Appropriate  Insight:  Good  Engagement in Group:  Engaged  Modes of Intervention:  Education  Summary of Progress/Problems: The patient described his day as having been "up and down". The patient verbalized that his medication is causing him to fidget quite a bit. He states that he is unable to sit still. He also mentioned that he enjoys talking to others and that it helps to get through his days. As a theme for the day, his coping skills are as follows: think positively, and talk to his peers.   Hazle Coca S 01/18/2014, 9:07 PM

## 2014-01-19 NOTE — Progress Notes (Signed)
Patient ID: Raymond Church, male   DOB: [birth year], 23 y.o.   MRN: [id number] D)  Was awake with c/o coughing, requested something for cough and to help with sleep.  Was given vistaril and cepacol losenge.  Also requested dsg to be changed on rt elbow after shower.  Old dsg removed, neosporin and telfa dsg to rt elbow for abrasion.  No other c/o's voiced, went back to bed. A)  Will continue to monitor for safety, continue POC R)  Safety maintained.

## 2014-01-19 NOTE — [facility] Group Notes (Signed)
[facility] Group Notes:  (Nursing/MHT/Case Management/Adjunct)  Date:  01/19/2014  Time:  11:30 AM  Type of Therapy:  Psychoeducational Skills- Patient Self Inventory Group  Participation Level:  Did Not Attend   Type of Therapy:  Psychoeducational Skills-Healthy Support Systems  Participation Level:  Did Not Attend   Raymond Church 01/19/2014, 11:30 AM 

## 2014-01-19 NOTE — [facility] Group Notes (Signed)
[facility] Group Notes: (Clinical Social Work)   01/19/2014      Type of Therapy:  Group Therapy   Participation Level:  Did Not Attend - declined MHT request to attend group   Ambrose Mantle, LCSW 01/19/2014, 1:11 PM

## 2014-01-19 NOTE — Progress Notes (Signed)
Patient ID: Raymond Church, male   DOB: [birth year], 23 y.o.   MRN: [id number] Patient ID: Raymond Church, male   DOB: [birth year], 23 y.o.   MRN: [id number] [facility] MD Progress Note  01/19/2014 3:51 PM Raymond Church  MRN:  [id number] Subjective: Patient states  Orva reports, "I'm okay for the most part. I'm just ready to go home"  Objective: Patient seen and chart reviewed. Trayson says he is feeling well. Says his discharge plan is to maintain mood stability by staying focus. He denies SI/HI today. He is compliant with his medications. Denies side effects. Plays the piano.  Per staff patient has been attending groups.   Diagnosis:   DSM5: Primary psychiatric diagnosis:  Bipolar disorder ,Type I , current episode ,with  Mixed features,moderate   Secondary psychiatric diagnosis:  Stimulant (cocaine) use disorder  Marijuana use disorder  Tobacco use disorder   Non psychiatric diagnosis:  Asthma  Total Time spent with patient: 30 minutes   ADL's:  Impaired  Sleep: Fair  Appetite:  Fair  Psychiatric Specialty Exam: Physical Exam  Constitutional: He is oriented to person, place, and time. He appears well-developed and well-nourished.  HENT:  Head: Normocephalic and atraumatic.  Eyes: Conjunctivae are normal. Pupils are equal, round, and reactive to light.  Neck: Normal range of motion. Neck supple.  Cardiovascular: Normal rate and regular rhythm.   Respiratory: Effort normal.  GI: Soft.  Musculoskeletal: Normal range of motion.  Neurological: He is alert and oriented to person, place, and time.  Skin: Skin is warm.  Psychiatric: His speech is normal. His mood appears anxious. Cognition and memory are normal. He expresses impulsivity. He exhibits a depressed mood. He expresses no homicidal and no suicidal ideation.    Review of Systems  Constitutional: Negative.   HENT: Negative.   Eyes: Negative.   Respiratory: Negative.   Cardiovascular: Negative.   Gastrointestinal:  Negative.   Genitourinary: Negative.   Musculoskeletal: Positive for myalgias (improving).  Skin: Negative.   Neurological: Negative.   Psychiatric/Behavioral: Positive for depression and substance abuse. Negative for suicidal ideas. The patient is nervous/anxious.     Blood pressure 112/70, pulse 63, temperature 98.1 F (36.7 C), temperature source Oral, resp. rate 16, height 5' 6.75" (1.695 m), weight 61.689 kg (136 lb), SpO2 100.00%.Body mass index is 21.47 kg/(m^2).  General Appearance: Fairly Groomed  Patent attorney::  Fair  Speech:  Normal Rate  Volume:  Normal  Mood:  Stable  Affect:  Flat  Thought Process:  Goal Directed  Orientation:  Full (Time, Place, and Person)  Thought Content:  Rumination  Suicidal Thoughts:  No  Homicidal Thoughts:  No  Memory:  Immediate;   Good Recent;   Good Remote;   Good  Judgement:  Impaired  Insight:  Lacking  Psychomotor Activity:  Normal  Concentration:  Good  Recall:  Fair  Fund of Knowledge:Fair  Language: Fair  Akathisia:  No    AIMS (if indicated):   0  Assets:  Communication Skills Desire for Improvement  Sleep:  Number of Hours: 5.25   Musculoskeletal: Strength & Muscle Tone: within normal limits Gait & Station: normal Patient leans: [name]  Current Medications: Current Facility-Administered Medications  Medication Dose Route Frequency Provider Last Rate Last Dose  . acetaminophen (TYLENOL) tablet 650 mg  650 mg Oral Q6H PRN Kerry Hough, PA-C   650 mg at 01/15/14 0127  . albuterol (PROVENTIL HFA;VENTOLIN HFA) 108 (90 BASE) MCG/ACT inhaler 2 puff  2 puff  Inhalation Q4H PRN Jomarie Longs, MD      . alum & mag hydroxide-simeth (MAALOX/MYLANTA) 200-200-20 MG/5ML suspension 30 mL  30 mL Oral Q4H PRN Kerry Hough, PA-C      . benztropine (COGENTIN) tablet 2 mg  2 mg Oral BID Mojeed Akintayo   2 mg at 01/19/14 0807  . divalproex (DEPAKOTE ER) 24 hr tablet 1,000 mg  1,000 mg Oral QPM Saramma Eappen, MD   1,000 mg at 01/18/14  1811  . haloperidol (HALDOL) tablet 10 mg  10 mg Oral BID Jomarie Longs, MD   10 mg at 01/19/14 0807  . hydrOXYzine (ATARAX/VISTARIL) tablet 25 mg  25 mg Oral Q6H PRN Kerry Hough, PA-C   25 mg at 01/19/14 0002  . ibuprofen (ADVIL,MOTRIN) tablet 600 mg  600 mg Oral Q6H PRN Kerry Hough, PA-C   600 mg at 01/18/14 0029  . magnesium hydroxide (MILK OF MAGNESIA) suspension 30 mL  30 mL Oral Daily PRN Kerry Hough, PA-C      . neomycin-bacitracin-polymyxin (NEOSPORIN) ointment   Topical BID Kerry Hough, PA-C      . nicotine (NICODERM CQ - dosed in mg/24 hours) patch 21 mg  21 mg Transdermal Daily Saramma Eappen, MD   21 mg at 01/19/14 0806  . prazosin (MINIPRESS) capsule 1 mg  1 mg Oral QHS Jomarie Longs, MD   1 mg at 01/18/14 2120  . traZODone (DESYREL) tablet 50 mg  50 mg Oral QHS,MR X 1 Kerry Hough, PA-C   50 mg at 01/18/14 2201  . triamcinolone (KENALOG) 0.1 % paste   Mouth/Throat TID Sanjuana Kava, NP        Lab Results: No results found for this or any previous visit (from the past 48 hour(s)).  Physical Findings: AIMS: Facial and Oral Movements Muscles of Facial Expression: None, normal Lips and Perioral Area: None, normal Jaw: None, normal Tongue: None, normal,Extremity Movements Upper (arms, wrists, hands, fingers): None, normal Lower (legs, knees, ankles, toes): None, normal, Trunk Movements Neck, shoulders, hips: None, normal, Overall Severity Severity of abnormal movements (highest score from questions above): None, normal Incapacitation due to abnormal movements: None, normal Patient's awareness of abnormal movements (rate only patient's report): No Awareness, Dental Status Current problems with teeth and/or dentures?: Yes (cavities) Does patient usually wear dentures?: No  CIWA:  CIWA-Ar Total: 0 COWS:  COWS Total Score: 1  Treatment Plan Summary: Daily contact with patient to assess and evaluate symptoms and progress in treatment Medication  management  Assessment and Plan: Continue inpatient treatment and stabilization.  Continue current treatment plan in place. Discharge plan in progress.  Medical Decision Making Problem Points:  Established problem, stable/improving (1) and Review of last therapy session (1) Data Points:  Order Aims Assessment (2) Review of medication regiment & side effects (2)  I certify that inpatient services furnished can reasonably be expected to improve the patient's condition.   Armandina Stammer I, PMHNP 01/19/2014, 3:51 PM.   I agreed with the findings, treatment and disposition plan of this patient. Kathryne Sharper, MD

## 2014-01-19 NOTE — Progress Notes (Signed)
[facility] Group Notes:  (Nursing/MHT/Case Management/Adjunct)  Date:  01/19/2014  Time:  10:29 PM  Type of Therapy:  Psychoeducational Skills  Participation Level:  Active  Participation Quality:  Attentive  Affect:  Depressed  Cognitive:  Lacking  Insight:  Lacking  Engagement in Group:  Developing/Improving  Modes of Intervention:  Education  Summary of Progress/Problems: The patient shared in group that he had a great day and is looking forward to getting discharged. Very few additional details were provided. The patient did mention that his sister is very supportive of him (theme of the day) and that he would not be in the hospital seeking treatment had it not been for her. As a theme for the day, his support system will be made up of his girl friends, sister, god sister, and grandmother.   Hazle Coca S 01/19/2014, 10:29 PM

## 2014-01-19 NOTE — Progress Notes (Signed)
Patient ID: Raymond Church, male   DOB: 1991-04-08, 23 y.o.   MRN: [id number]   D: Pt continues to be very flat and depressed on the unit, however patient was more alert today. Pt did not attend any groups, and did not engage in treatment much. Pt also did not interact much with staff or peers, would only talk to one male patient on the unit. Pt and male patient had to be redirected several times because they were hanging out around each others room doors. Pt reported being negative SI/HI, no AH/VH noted. A: 15 min checks continued for patient safety. R: Pt safety maintained.

## 2014-01-20 NOTE — [facility] Group Notes (Signed)
[facility] LCSW Group Therapy  01/20/2014 1:15 pm  Type of Therapy: Process Group Therapy  Participation Level:  Active  Participation Quality:  Appropriate  Affect:  Flat  Cognitive:  Oriented  Insight:  Improving  Engagement in Group:  Limited  Engagement in Therapy:  Limited  Modes of Intervention:  Activity, Clarification, Education, Problem-solving and Support  Summary of Progress/Problems: Today's group addressed the issue of overcoming obstacles.  Patients were asked to identify their biggest obstacle post d/c that stands in the way of their on-going success, and then problem solve as to how to manage this.  Raymond Church was engaged throughout, with the exception of one brief break.  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  He identified no current obstacles for himself, but gave a lot of advice to others that involved practicing patience and turning problems over to a higher power, along with other philosophical phrases such as "if you love something let it go, and if it comes back you were meant to be together."  Raymond Church 01/20/2014   3:58 PM

## 2014-01-20 NOTE — Progress Notes (Signed)
Patient ID: Raymond Church, male   DOB: 1990/07/31, 23 y.o.   MRN: [id number] D)  Has been out and about on the unit this evening, bright, pleasant, interacting appropriately with staff and peers.  Is focused on discharge, states misses his 3 kids,  Hoping to leave tomorrow.  Attended group this evening, participated, stated his day was great, again talked about leaving.  Stated he needs to get back to work to earn some money to support his family, states he gets a lot of support from his sister,  His grandmother, his kids and their mother. Was compliant with hs meds, played the piano after group and has been in good spirits.   \A)  Will continue to monitor for safety, continue POC, support R)  Safety maintained.

## 2014-01-20 NOTE — Progress Notes (Signed)
Patient ID: Raymond Church, male   DOB: 21-Dec-1990, 23 y.o.   MRN: [id number] Patient ID: Raymond Church, male   DOB: [birth year], 23 y.o.   MRN: [id number] [facility] MD Progress Note  01/20/2014 12:39 PM Raymond Church  MRN:  [id number] Subjective: Patient states  Raymond Church reports, "I'm okay "  Objective: Patient seen and chart reviewed. Raymond Church says he is feeling well. His depression is improving and his moods are quiet stable. He reports sleep as improved and denies any nightmares or flashbacks. Denies SI/HI today. He is compliant with his medications. Denies side effects. Patient seen in day room playing the piano and doing a good job with it.  Per staff patient has been attending groups.   Diagnosis:   DSM5: Primary psychiatric diagnosis:  Bipolar disorder ,Type I , current episode ,with  Mixed features,moderate   Secondary psychiatric diagnosis:  Stimulant (cocaine) use disorder  Marijuana use disorder  Tobacco use disorder   Non psychiatric diagnosis:  Asthma  Total Time spent with patient: 30 minutes   ADL's:  Impaired  Sleep: Fair  Appetite:  Fair  Psychiatric Specialty Exam: Physical Exam  Constitutional: He is oriented to person, place, and time. He appears well-developed and well-nourished.  HENT:  Head: Normocephalic and atraumatic.  Eyes: Conjunctivae are normal. Pupils are equal, round, and reactive to light.  Neck: Normal range of motion. Neck supple.  Cardiovascular: Normal rate and regular rhythm.   Respiratory: Effort normal.  GI: Soft.  Musculoskeletal: Normal range of motion.  Neurological: He is alert and oriented to person, place, and time.  Skin: Skin is warm.  Psychiatric: His speech is normal. His mood appears anxious. Cognition and memory are normal. He expresses impulsivity. He exhibits a depressed mood (improving). He expresses no homicidal and no suicidal ideation.    Review of Systems  Constitutional: Negative.   HENT: Negative.   Eyes: Negative.    Respiratory: Negative.   Cardiovascular: Negative.   Gastrointestinal: Negative.   Genitourinary: Negative.   Musculoskeletal: Negative for myalgias (improved).  Skin: Negative.   Neurological: Negative.   Psychiatric/Behavioral: Positive for depression (improving) and substance abuse. Negative for suicidal ideas. The patient is nervous/anxious.     Blood pressure 98/58, pulse 91, temperature 97.7 F (36.5 C), temperature source Oral, resp. rate 16, height 5' 6.75" (1.695 m), weight 61.689 kg (136 lb), SpO2 100.00%.Body mass index is 21.47 kg/(m^2).  General Appearance: Fairly Groomed  Patent attorney::  Fair  Speech:  Normal Rate  Volume:  Normal  Mood:  Stable  Affect:  Flat  Thought Process:  Goal Directed  Orientation:  Full (Time, Place, and Person)  Thought Content:  Rumination  Suicidal Thoughts:  No  Homicidal Thoughts:  No  Memory:  Immediate;   Good Recent;   Good Remote;   Good  Judgement:  Impaired  Insight:  Lacking  Psychomotor Activity:  Normal  Concentration:  Good  Recall:  Fair  Fund of Knowledge:Fair  Language: Fair  Akathisia:  No    AIMS (if indicated):   0  Assets:  Communication Skills Desire for Improvement  Sleep:  Number of Hours: 6.5   Musculoskeletal: Strength & Muscle Tone: within normal limits Gait & Station: normal Patient leans: [name]  Current Medications: Current Facility-Administered Medications  Medication Dose Route Frequency Provider Last Rate Last Dose  . acetaminophen (TYLENOL) tablet 650 mg  650 mg Oral Q6H PRN Raymond Hough, PA-C   650 mg at 01/20/14 4696  . albuterol (  PROVENTIL HFA;VENTOLIN HFA) 108 (90 BASE) MCG/ACT inhaler 2 puff  2 puff Inhalation Q4H PRN Jomarie Longs, MD      . alum & mag hydroxide-simeth (MAALOX/MYLANTA) 200-200-20 MG/5ML suspension 30 mL  30 mL Oral Q4H PRN Raymond Hough, PA-C      . benztropine (COGENTIN) tablet 2 mg  2 mg Oral BID Mojeed Akintayo   2 mg at 01/20/14 0756  . divalproex (DEPAKOTE ER)  24 hr tablet 1,000 mg  1,000 mg Oral QPM Brendan Gadson, MD   1,000 mg at 01/19/14 1717  . haloperidol (HALDOL) tablet 10 mg  10 mg Oral BID Jomarie Longs, MD   10 mg at 01/20/14 0756  . hydrOXYzine (ATARAX/VISTARIL) tablet 25 mg  25 mg Oral Q6H PRN Raymond Hough, PA-C   25 mg at 01/19/14 0002  . ibuprofen (ADVIL,MOTRIN) tablet 600 mg  600 mg Oral Q6H PRN Raymond Hough, PA-C   600 mg at 01/20/14 1100  . magnesium hydroxide (MILK OF MAGNESIA) suspension 30 mL  30 mL Oral Daily PRN Raymond Hough, PA-C      . neomycin-bacitracin-polymyxin (NEOSPORIN) ointment   Topical BID Raymond Hough, PA-C      . nicotine (NICODERM CQ - dosed in mg/24 hours) patch 21 mg  21 mg Transdermal Daily Jomarie Longs, MD   21 mg at 01/20/14 0824  . prazosin (MINIPRESS) capsule 1 mg  1 mg Oral QHS Jomarie Longs, MD   1 mg at 01/19/14 2109  . traZODone (DESYREL) tablet 50 mg  50 mg Oral QHS,MR X 1 Raymond Hough, PA-C   50 mg at 01/19/14 2147  . triamcinolone (KENALOG) 0.1 % paste   Mouth/Throat TID Sanjuana Kava, NP        Lab Results: No results found for this or any previous visit (from the past 48 hour(s)).  Physical Findings: AIMS: Facial and Oral Movements Muscles of Facial Expression: None, normal Lips and Perioral Area: None, normal Jaw: None, normal Tongue: None, normal,Extremity Movements Upper (arms, wrists, hands, fingers): None, normal Lower (legs, knees, ankles, toes): None, normal, Trunk Movements Neck, shoulders, hips: None, normal, Overall Severity Severity of abnormal movements (highest score from questions above): None, normal Incapacitation due to abnormal movements: None, normal Patient's awareness of abnormal movements (rate only patient's report): No Awareness, Dental Status Current problems with teeth and/or dentures?: Yes (cavities) Does patient usually wear dentures?: No  CIWA:  CIWA-Ar Total: 0 COWS:  COWS Total Score: 1  Treatment Plan Summary: Daily contact with patient  to assess and evaluate symptoms and progress in treatment Medication management  Assessment and Plan: Continue inpatient treatment and stabilization.  Will continue Haldol 10 mg po bid for psychosis. Will continue Cogentin 2 mg po bid for EPS. Will continue Depakote ER 1000 mg po daily for mood lability. Will order Depakote level for tomorrow AM. Will continue Prazosin 1 mg po qhs for nightmares and continue Trazodone 50 mg po qhs for sleep. Discharge plan in progress.  Medical Decision Making Problem Points:  Established problem, stable/improving (1) and Review of last therapy session (1) Data Points:  Order Aims Assessment (2) Review of medication regiment & side effects (2)  I certify that inpatient services furnished can reasonably be expected to improve the patient's condition.   Trygg Mantz MD 01/20/2014, 12:39 PM.

## 2014-01-20 NOTE — Progress Notes (Signed)
D: Pt presents with flat affect and depressed mood. Pt rates depression 3/10. Per pt, he is ready to go home and visit with his three children. Pt compliant with taking meds and attending groups. No adverse reaction to meds verbalized by pt. No SI/HI/AVH verbalized by pt. Pt plans to go live with his "baby mama" once he is discharged and f/u with [facility].  A: Medications administered as ordered per MD. Verbal support given. Pt encouraged to attend groups. 15 minute checks performed for safety.  R: Pt receptive to treatment. Pt safety maintained at this time.

## 2014-01-20 NOTE — Tx Team (Signed)
  Interdisciplinary Treatment Plan Update   Date Reviewed:  01/20/2014  Time Reviewed:  8:19 AM  Progress in Treatment:   Attending groups: Yes Participating in groups: Yes Taking medication as prescribed: Yes  Tolerating medication: Yes Family/Significant other contact made: Yes  Patient understands diagnosis: Yes  Discussing patient identified problems/goals with staff: Yes  See initial care plan Medical problems stabilized or resolved: Yes Denies suicidal/homicidal ideation: Yes  In tx team Patient has not harmed self or others: Yes  For review of initial/current patient goals, please see plan of care.  Estimated Length of Stay:  Likely d/c tomorrow  Reason for Continuation of Hospitalization:   New Problems/Goals identified:  [name]  Discharge Plan or Barriers:   return home, follow up outpt  Additional Comments:  Attendees:  Signature: Ivin Booty, MD 01/20/2014 8:19 AM   Signature: Richelle Ito, LCSW 01/20/2014 8:19 AM  Signature: Fransisca Kaufmann, NP 01/20/2014 8:19 AM  Signature: Joslyn Devon, RN 01/20/2014 8:19 AM  Signature: Liborio Nixon, RN 01/20/2014 8:19 AM  Signature:  01/20/2014 8:19 AM  Signature:   01/20/2014 8:19 AM  Signature:    Signature:    Signature:    Signature:    Signature:    Signature:      Scribe for Treatment Team:   Richelle Ito, LCSW  01/20/2014 8:19 AM

## 2014-01-20 NOTE — [facility] Group Notes (Signed)
[facility] LCSW Aftercare Discharge Planning Group Note   01/20/2014 10:31 AM  Participation Quality:  Appropriate   Mood/Affect:  Appropriate  Depression Rating:  5  Anxiety Rating:  0  Thoughts of Suicide:  No Will you contract for safety?   NA  Current AVH:  No  Plan for Discharge/Comments:  Pt reports that he plans to d/c Tuesday and live with the mother of his son. He plans to follow-up at [facility] for med management. Merril shared that he does not feel anxious but is working through grief and guilt due to his mother's death when he was a teenager. Pt reports that he feels like other patients are his family and stated that he had a good weekend playing basketball.   Transportation Means: bus pass-"I'm going straight to [place] when I leave tomorrow." Pt's sister will pick him up from [place].   Supports: Pensions consultant, Oncologist

## 2014-01-20 NOTE — Progress Notes (Signed)
Patient ID: ANTWYNE PINGREE, male   DOB: March 25, 1991, 23 y.o.   MRN: [id number] D: client visible on the hall, reports "a little anxious, about me being here ready to go home" client denies AVH. Reports pain in left jaw at "9" of 10. A: Writer introduced self to client, administered Acetaminophen 650 mg for pain. Staff will monitor q29min for safety. R: client is safe on the unit, attended group.

## 2014-01-21 LAB — VALPROIC ACID LEVEL: Valproic Acid Lvl: 58.8 ug/mL (ref 50.0–100.0)

## 2014-01-21 MED ORDER — HALOPERIDOL 10 MG PO TABS: 10.0000 mg | ORAL_TABLET | Freq: Two times a day (BID) | ORAL | Status: DC

## 2014-01-21 MED ORDER — DIVALPROEX SODIUM ER 500 MG PO TB24: 1000.0000 mg | ORAL_TABLET | Freq: Every evening | ORAL | Status: DC

## 2014-01-21 MED ORDER — PRAZOSIN HCL 1 MG PO CAPS: 1.0000 mg | ORAL_CAPSULE | Freq: Every day | ORAL | Status: DC

## 2014-01-21 MED ORDER — TRAZODONE HCL 50 MG PO TABS: 50.0000 mg | ORAL_TABLET | Freq: Every evening | ORAL | Status: DC | PRN

## 2014-01-21 MED ORDER — BENZTROPINE MESYLATE 2 MG PO TABS: 2.0000 mg | ORAL_TABLET | Freq: Two times a day (BID) | ORAL | Status: DC
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
## 2014-01-21 NOTE — Discharge Summary (Signed)
Patient was seen face to face for psychiatric evaluation, suicide risk assessment and case discussed with treatment team and NP and made appropriate disposition plans. Reviewed the information documented and agree with the treatment plan.   Olubunmi Rothenberger ,MD Attending Psychiatrist  [facility]    

## 2014-01-21 NOTE — Psychosocial Assessment (Signed)
Writer reviewed pt discharge instructions with pt including medications, follow up care and crisis intervention. Pt acknowledged understanding of instructions and states that he has no reservations about leaving [facility] at this time. Pt denies SI/HI and AVH. Pt mood and affect are appropriate to the situation. Writer returned pt belongings from locker, and the pt is released into his own care.

## 2014-01-21 NOTE — Progress Notes (Signed)
[facility] Adult Case Management Discharge Plan :  Will you be returning to the same living situation after discharge: Yes,  home At discharge, do you have transportation home?:Yes,  family Do you have the ability to pay for your medications:Yes,  mental health  Release of information consent forms completed and in the chart;  Patient's signature needed at discharge.  Patient to Follow up at: Follow-up Information   Follow up with [facility]. (Go to the walk-in clinic M-F between 8 and 10 AM for your hospital follow up appointment)    Contact information:   [address]  [place]  [336] [phone]      Patient denies SI/HI:   Yes,  yes    Safety Planning and Suicide Prevention discussed:  Yes,  yes  Ida Rogue 01/21/2014, 10:17 AM
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
## 2014-01-21 NOTE — Progress Notes (Signed)
Adult Psychoeducational Group Note  Date:  01/21/2014 Time:  4:24 AM  Group Topic/Focus:  Wrap-Up Group:   The focus of this group is to help patients review their daily goal of treatment and discuss progress on daily workbooks.  Participation Level:  Active  Participation Quality:  Appropriate  Affect:  Irritable  Cognitive:  Appropriate  Insight: Good  Engagement in Group:  Engaged  Modes of Intervention:  Socialization and Support  Additional Comments:  Patient attended and participated in group tonight. He reports that his day today was interactive. He woke up today with the intension to go home, but he has to wait until tomorrow. He had to exercise patience. The patient advised that his experience here has changed his life. He now understand his illness. He now know why he become anxious, why he is hyper and why he become depress.  He also learn what to look for when these symptoms starts it onset and what to do in order to control himself. He described his day as being powerful.  Lita Mains [facility] 01/21/2014, 4:24 AM

## 2014-01-23 NOTE — Progress Notes (Signed)
Patient Discharge Instructions:  After Visit Summary (AVS):   Faxed to:  01/23/14 Discharge Summary Note:   Faxed to:  01/23/14 Psychiatric Admission Assessment Note:   Faxed to:  01/23/14 Suicide Risk Assessment - Discharge Assessment:   Faxed to:  01/23/14 Faxed/Sent to the Next Level Care provider:  01/23/14 Faxed to [facility] @ [phone]  Jerelene Redden, 01/23/2014, 3:53 PM

## 2014-01-26 ENCOUNTER — Emergency Department (HOSPITAL_COMMUNITY)
Admission: EM | Admit: 2014-01-26 | Discharge: 2014-01-26 | Disposition: A | Discharge status: Home / Self Care | Payer: Self-pay | Attending: Emergency Medicine | Admitting: Emergency Medicine

## 2014-01-26 ENCOUNTER — Encounter (HOSPITAL_COMMUNITY): Payer: Self-pay | Admitting: Emergency Medicine

## 2014-01-26 DIAGNOSIS — Z792 Long term (current) use of antibiotics: Secondary | ICD-10-CM | POA: Insufficient documentation

## 2014-01-26 DIAGNOSIS — F17200 Nicotine dependence, unspecified, uncomplicated: Secondary | ICD-10-CM | POA: Insufficient documentation

## 2014-01-26 DIAGNOSIS — K0889 Other specified disorders of teeth and supporting structures: Secondary | ICD-10-CM

## 2014-01-26 DIAGNOSIS — Z880 Allergy status to penicillin: Secondary | ICD-10-CM | POA: Insufficient documentation

## 2014-01-26 DIAGNOSIS — Z79899 Other long term (current) drug therapy: Secondary | ICD-10-CM | POA: Insufficient documentation

## 2014-01-26 DIAGNOSIS — K089 Disorder of teeth and supporting structures, unspecified: Secondary | ICD-10-CM | POA: Insufficient documentation

## 2014-01-26 DIAGNOSIS — J45909 Unspecified asthma, uncomplicated: Secondary | ICD-10-CM | POA: Insufficient documentation

## 2014-01-26 MED ORDER — CLINDAMYCIN HCL 150 MG PO CAPS: 300.0000 mg | ORAL_CAPSULE | Freq: Three times a day (TID) | ORAL | Status: DC

## 2014-01-26 MED ORDER — OXYCODONE-ACETAMINOPHEN 5-325 MG PO TABS
1.0000 | ORAL_TABLET | Freq: Once | ORAL | Status: AC
  Administered 2014-01-26: 1 via ORAL
  Filled 2014-01-26: qty 1

## 2014-01-26 MED ORDER — NAPROXEN 500 MG PO TABS: 500.0000 mg | ORAL_TABLET | Freq: Two times a day (BID) | ORAL | Status: DC

## 2014-01-26 MED ORDER — HYDROCODONE-ACETAMINOPHEN 5-325 MG PO TABS: 1.0000 | ORAL_TABLET | Freq: Four times a day (QID) | ORAL | Status: DC | PRN

## 2014-01-26 NOTE — ED Notes (Signed)
The pt is c/o a toothache  For 2 days.

## 2014-01-26 NOTE — ED Provider Notes (Signed)
Medical screening examination/treatment/procedure(s) were performed by non-physician practitioner and as supervising physician I was immediately available for consultation/collaboration.   EKG Interpretation None        Layla Maw Shravan Salahuddin, DO 01/26/14 [phone]

## 2014-01-26 NOTE — ED Provider Notes (Signed)
CSN: [id number]     Arrival date & time 01/26/14  0549 History   First MD Initiated Contact with Patient 01/26/14 0559     Chief Complaint  Patient presents with  . Dental Pain    (Consider location/radiation/quality/duration/timing/severity/associated sxs/prior Treatment) Patient is a 23 y.o. male presenting with tooth pain. The history is provided by the patient. No language interpreter was used.  Dental Pain Location:  Lower Lower teeth location:  28/RL 1st bicuspid, 29/RL 2nd bicuspid and 31/RL 2nd molar Quality:  Aching and throbbing Severity:  Mild Onset quality:  Gradual Timing:  Constant Progression:  Worsening Chronicity:  New Context: dental caries, dental fracture and poor dentition   Context: not trauma   Relieved by:  Acetaminophen Worsened by:  Touching and jaw movement Associated symptoms: no difficulty swallowing, no drooling, no facial swelling, no fever, no oral bleeding, no oral lesions and no trismus   Risk factors: lack of dental care and smoking     Past Medical History  Diagnosis Date  . Asthma    History reviewed. No pertinent past surgical history. Family History  Problem Relation Age of Onset  . Bipolar disorder Mother   . Drug abuse Mother    History  Substance Use Topics  . Smoking status: Current Every Day Smoker -- 0.25 packs/day for 10 years    Types: Cigarettes  . Smokeless tobacco: Not on file  . Alcohol Use: Yes    Review of Systems  Constitutional: Negative for fever.  HENT: Positive for dental problem. Negative for drooling, facial swelling and mouth sores.   All other systems reviewed and are negative.   Allergies  Shellfish allergy; Amoxicillin; Mushroom extract complex; and Penicillins  Home Medications   Prior to Admission medications   Medication Sig Start Date End Date Taking? Authorizing Provider  benztropine (COGENTIN) 2 MG tablet Take 1 tablet (2 mg total) by mouth 2 (two) times daily. 01/21/14   Fransisca Kaufmann, NP    clindamycin (CLEOCIN) 150 MG capsule Take 2 capsules (300 mg total) by mouth 3 (three) times daily. May dispense as  capsules 01/26/14   Antony Madura, PA-C  divalproex (DEPAKOTE ER) 500 MG 24 hr tablet Take 2 tablets (1,000 mg total) by mouth every evening. 01/21/14   Fransisca Kaufmann, NP  haloperidol (HALDOL) 10 MG tablet Take 1 tablet (10 mg total) by mouth 2 (two) times daily. 01/21/14   Fransisca Kaufmann, NP  HYDROcodone-acetaminophen (NORCO/VICODIN) 5-325 MG per tablet Take 1 tablet by mouth every 6 (six) hours as needed for moderate pain or severe pain. 01/26/14   Antony Madura, PA-C  naproxen (NAPROSYN) 500 MG tablet Take 1 tablet (500 mg total) by mouth 2 (two) times daily. 01/26/14   Antony Madura, PA-C  prazosin (MINIPRESS) 1 MG capsule Take 1 capsule (1 mg total) by mouth at bedtime. 01/21/14   Fransisca Kaufmann, NP  traZODone (DESYREL) 50 MG tablet Take 1 tablet (50 mg total) by mouth at bedtime and may repeat dose one time if needed. 01/21/14   Fransisca Kaufmann, NP   BP 109/60  Pulse 73  Temp(Src) 98 F (36.7 C) (Oral)  Resp 18  Ht  (1.626 m)  Wt 147 lb (66.679 kg)  BMI 25.22 kg/m2  SpO2 100%  Physical Exam  Nursing note and vitals reviewed. Constitutional: He is oriented to person, place, and time. He appears well-developed and well-nourished. No distress.  Nontoxic/nonseptic appearing  HENT:  Head: Normocephalic and atraumatic.  Right Ear: External ear normal.  Left Ear: External ear normal.  Mouth/Throat: Uvula is midline, oropharynx is clear and moist and mucous membranes are normal. No oral lesions. No trismus in the jaw. Abnormal dentition.    No trismus. Uvula midline. Patient tolerating secretions without difficulty.  Eyes: Conjunctivae and EOM are normal. No scleral icterus.  Neck: Normal range of motion. Neck supple.  Pulmonary/Chest: Effort normal. No respiratory distress.  Musculoskeletal: Normal range of motion.  Neurological: He is alert and oriented to person, place, and time.   Skin: Skin is warm and dry. No rash noted. He is not diaphoretic. No erythema. No pallor.  Psychiatric: He has a normal mood and affect. His behavior is normal.    ED Course  Procedures (including critical care time) Labs Review Labs Reviewed - No data to display  Imaging Review No results found.   EKG Interpretation None      MDM   Final diagnoses:  Dentalgia    Patient with toothache. No gross abscess. Exam unconcerning for Ludwig's angina or spread of infection. Will treat with clindamycin and pain medicine. Urged patient to follow-up with dentist. Resource guide and referral provided. Patient agreeable to plan with no unaddressed concerns.   Filed Vitals:   01/26/14 0553 01/26/14 0630 01/26/14 0638  BP: 124/80 109/60   Pulse: 76 73   Temp: 97.4 F (36.3 C)  98 F (36.7 C)  TempSrc: Oral  Oral  Resp: 18    Height:  (1.626 m)    Weight: 147 lb (66.679 kg)    SpO2: 100% 100%        Antony Madura, PA-C 01/26/14 0730

## 2014-01-26 NOTE — Discharge Instructions (Signed)
Take Clindamycin as prescribed. Take Naproxen for pain. Take Norco for severe pain as needed. Follow up with a dentist for further evaluation of symptoms.  Dental Pain A tooth ache may be caused by cavities (tooth decay). Cavities expose the nerve of the tooth to air and hot or cold temperatures. It may come from an infection or abscess (also called a boil or furuncle) around your tooth. It is also often caused by dental caries (tooth decay). This causes the pain you are having. DIAGNOSIS  Your caregiver can diagnose this problem by exam. TREATMENT   If caused by an infection, it may be treated with medications which kill germs (antibiotics) and pain medications as prescribed by your caregiver. Take medications as directed.  Only take over-the-counter or prescription medicines for pain, discomfort, or fever as directed by your caregiver.  Whether the tooth ache today is caused by infection or dental disease, you should see your dentist as soon as possible for further care. SEEK MEDICAL CARE IF: The exam and treatment you received today has been provided on an emergency basis only. This is not a substitute for complete medical or dental care. If your problem worsens or new problems (symptoms) appear, and you are unable to meet with your dentist, call or return to this location. SEEK IMMEDIATE MEDICAL CARE IF:   You have a fever.  You develop redness and swelling of your face, jaw, or neck.  You are unable to open your mouth.  You have severe pain uncontrolled by pain medicine. MAKE SURE YOU:   Understand these instructions.  Will watch your condition.  Will get help right away if you are not doing well or get worse. Document Released: 04/18/2005 Document Revised: 07/11/2011 Document Reviewed: 12/05/2007 [facility] Patient Information 2015 [place], [place]. This information is not intended to replace advice given to you by your health care provider. Make sure you discuss any questions you  have with your health care provider.  Emergency Department Resource Guide 1) Find a Doctor and Pay Out of Pocket Although you won't have to find out who is covered by your insurance plan, it is a good idea to ask around and get recommendations. You will then need to call the office and see if the doctor you have chosen will accept you as a new patient and what types of options they offer for patients who are self-pay. Some doctors offer discounts or will set up payment plans for their patients who do not have insurance, but you will need to ask so you aren't surprised when you get to your appointment.  2) Contact Your Local Health Department Not all health departments have doctors that can see patients for sick visits, but many do, so it is worth a call to see if yours does. If you don't know where your local health department is, you can check in your phone book. The CDC also has a tool to help you locate your state's health department, and many state websites also have listings of all of their local health departments.  3) Find a Walk-in Clinic If your illness is not likely to be very severe or complicated, you may want to try a walk in clinic. These are popping up all over the country in pharmacies, drugstores, and shopping centers. They're usually staffed by nurse practitioners or physician assistants that have been trained to treat common illnesses and complaints. They're usually fairly quick and inexpensive. However, if you have serious medical issues or chronic medical problems, these  are probably not your best option.  No Primary Care Doctor: - Call Health Connect at  [phone] - they can help you locate a primary care doctor that  accepts your insurance, provides certain services, etc. - Physician Referral Service- [phone]  Chronic Pain Problems: Organization         Address  Phone   Notes  [facility]  [phone] Patients need to be referred by their primary  care doctor.   Medication Assistance: Organization         Address  Phone   Notes  [facility] Medication [facility] [address] [place] [place], [place] [address] [phone] --Must be a resident of [facility] -- Must have NO insurance coverage whatsoever (no Medicaid/ Medicare, etc.) -- The pt. MUST have a primary care doctor that directs their care regularly and follows them in the community   [facility]ist  [phone]   [employer]  [phone]    Agencies that provide inexpensive medical care: Organization         Address  Phone   Notes  [facility]  [phone]   [facility]    [phone]   [facility] [address] [place], [place] [address] [phone]   [facility] [address] [place] [phone]   Planned Parenthood    [phone]   [facility]    [phone]   [facility]  [address], [place] Phone:  [phone], Fax:  [phone] Hours of Operation:  9 am - 6 pm, M-F.  Also accepts Medicaid/Medicare and self-pay.  [facility]  [address], [place] Phone: [phone], Fax: [phone]. Hours of Operation:  8:30 am - 5:30 pm, M-F.  Also accepts Medicaid and self-pay.  [facility] [place] [address], [place] Phone: [phone]   [facility] [address] Natasha Bence [place], [place] [phone], Ext. [phone] Mondays & Thursdays: 7-9 AM.  First 15 patients are seen on a first come, first serve basis.    Medicaid-accepting [facility] Providers:  Organization         Address  Phone   Notes  [facility] [address], [place] [phone] Also accepts self-pay patients.  [facility] [address] Laurell Josephs [place], [place]  [phone]   [facility] [address], [place] [phone]   [facility] [address] [place] [phone]   Renaye Rakers [address], [place]   [phone] Only accepts [place] Access [place] patients after they have their name applied to their card.   Self-Pay (no insurance) in [facility]:  Organization         Address  Phone   Notes  Sickle Cell Patients, [facility] [address] [place], [place] [phone]   [facility] [address] [place], [place] [phone]   [facility] [place]  [address], [place] [phone]   [facility]/Dr. Osei-Bonsu  [address], [place] or [address], [place] [phone] Phone number for both [place] and [place] locations is the same.  [facility] [address], [place] [phone]   [facility] [address] [place] [address], [place] or [address] [phone]   [facility] [address], [place] [phone], phone; [phone], fax Sees patients 1st and 3rd Saturday of every month.  Must not qualify for public or private insurance (i.e. Medicaid, Medicare, [place] Health Choice, Veterans' Benefits)  Household income should be no more than 200% of the poverty level The clinic cannot treat you if you are pregnant or think you are pregnant  Sexually transmitted diseases are not treated at the clinic.    Dental Care: Organization         Address  Phone  Notes  [facility] [address], [place] [phone] Accepts children up to age 24 who are enrolled in [place] or [place] Health Choice; pregnant women with a Medicaid card; and children who have applied for Medicaid or [place] Health Choice, but were declined, whose parents can pay a reduced fee at  time of service.  [facility] [place]  [address], [place] [phone] Accepts children up to age 24 who are enrolled in [place] or [place] Health Choice; pregnant women with a Medicaid card; and children who have applied for Medicaid or [place] Health Choice, but were declined, whose parents can pay a reduced fee at time of service.  [facility]  [address], [place] [phone] Patients are seen by appointment only. Walk-ins are not accepted. [facility] will see patients 24 years of age and older. Monday - Tuesday (8am-5pm) Most Wednesdays (8:30-5pm) $30 per visit, cash only  [facility]  [address], [facility] [place] [phone] Patients are seen by appointment only. Walk-ins are not accepted. [facility] will see patients 24 years of age and older. One Wednesday Evening (Monthly: Volunteer Based).  $30 per visit, cash only  [employer]  [phone] for adults; Children under age 364, call Graduate Pediatric Dentistry at [phone]. Children aged 544-14, please call [phone] to request a pediatric application.  Dental services are provided in all areas of dental care including fillings, crowns and bridges, complete and partial dentures, implants, gum treatment, root canals, and extractions. Preventive care is also provided. Treatment is provided to both adults and children. Patients are selected via a lottery and there is often a waiting list.   [facility] [address], [place]  [phone] [email or web address]   [facility] [address], [place], [place] [phone], Ext. [phone] Second and Fourth Thursday of each month, opens at 6:30 AM; Clinic ends at 9 AM.  Patients are seen on a first-come first-served basis, and a limited number are seen during each clinic.   [facility]  [address], [place], [place] [phone]   Eligibility Requirements You must have lived in [place], [place], or [place] counties for at least the last three months.   You cannot be eligible for state or federal sponsored [place] Cityhealthcare insurance, including CIGNAVeterans Administration, [place]icaid, or [employer]icare.   You generally cannot be eligible for healthcare insurance through your employer.    How to apply: Eligibility screenings are held every Tuesday and Wednesday afternoon from 1:00 pm until 4:00 pm. You do not need an appointment for the interview!  [facility] [address], [place], [place] [phone]   °[facility]  [phone]   °[facility]  [phone]   °[facility]  336-570-6415   ° °Behavioral Health Resources in the Community: °Intensive Outpatient Programs °Organization         Address  Phone  Notes  °[place] [facility] [address], [place], [place] [phone]   °[facility] [address], [place], [place] [phone]   °[facility] [address], [place], [place] ° 336-882-2125   °[facility] [address],  °[place], [place] 1-800-853-5163 or 336-641-4981   °Substance Abuse Resources °Organization         Address  Phone  Notes  °[facility]  336-882-2125   °[facility]  336-784-9470   °[facility]  336-285-9073   °[facility]  336-845-3988   °Residential & Outpatient Substance Abuse Program  1-800-659-3381   °Psychological Services °Organization         Address  Phone  Notes  ° Health  336- 832-9600   °[facility]  336- 378-7881   °[facility] [address], [place] 1-800-853-5163 or 336-641-4981   ° °Mobile Crisis Teams °Organization         Address  Phone  Notes  °[facility], Mobile Crisis Care Unit  1-877-626-1772   °Assertive °Psychotherapeutic  Services ° [address] [place], [place] 336-834-9664   °Sharon DeEsch [address] °[place] 336-554-5454   ° °Self-Help/Support Groups °Organization         Address  Phone             Notes  °[facility] [place] - variety of support groups  336- 373-1402 Call for more information  °Narcotics Anonymous (NA), [facility] [address], °[place]  2 meetings at this location  ° °Residential Treatment Programs °Organization         Address  Phone  Notes  °[facility] [address],    °[place]  1-866-801-8205   °[facility] ° [address], [place], [place] [phone]   °[facility] [address], [place] 336-845-3988 Admissions: 8am-3pm M-F  °[facility] [address]    °[place], [place] 336-841-1104   °[facility] [address], [place], [place] 336-379-7146   °[facility] [name]  °[place], [place] 336-285-9073   °[facility] Intensive Outpatient [address], [place], [place] 336-852-3033   °[facility] [address]  °[place], [place] 1-877-615-2722 or 336-784-9470   °[facility] ([facility]) [address] [place], [place] 336-227-7417 Accepts Medicaid  °[facility] [address]  ° [place] 1-800-659-3381 Substance Abuse/Addiction Treatment  ° °[place] [facility] °Organization         Address  Phone  Notes  °[facility]  (888) 581-9988   °Julie Brannon, PhD [address] [place], [place]   (336) 349-5553 or (336) 951-0000   °[facility]   [address] °[place], [place] (336) 349-4454   °[facility] [address], [place],  (336) 342-8316 Insurance/Medicaid/sponsorship through [facility]  °[facility] [address]                                      [place], [place] [phone] Therapy/tele-psych/case  [facility] [address]   [place], [place] [phone]    Dr. Lolly Mustache  [phone]   [facility]  [employer] [facility] 1) [address] [place] 2) [address] [place] 3)  [address], [place] [phone]  7263742301   [facility] Child Abuse Hotline 501-368-4462 or 6054601306 (After Hours)

## 2014-03-08 ENCOUNTER — Emergency Department (HOSPITAL_COMMUNITY)
Admission: EM | Admit: 2014-03-08 | Discharge: 2014-03-08 | Disposition: A | Discharge status: Home / Self Care | Payer: Self-pay | Attending: Emergency Medicine | Admitting: Emergency Medicine

## 2014-03-08 ENCOUNTER — Emergency Department (HOSPITAL_COMMUNITY): Payer: Self-pay

## 2014-03-08 ENCOUNTER — Encounter (HOSPITAL_COMMUNITY): Payer: Self-pay | Admitting: *Deleted

## 2014-03-08 DIAGNOSIS — R112 Nausea with vomiting, unspecified: Secondary | ICD-10-CM | POA: Insufficient documentation

## 2014-03-08 DIAGNOSIS — Z720 Tobacco use: Secondary | ICD-10-CM | POA: Insufficient documentation

## 2014-03-08 DIAGNOSIS — Z792 Long term (current) use of antibiotics: Secondary | ICD-10-CM | POA: Insufficient documentation

## 2014-03-08 DIAGNOSIS — N50819 Testicular pain, unspecified: Secondary | ICD-10-CM

## 2014-03-08 DIAGNOSIS — R1031 Right lower quadrant pain: Secondary | ICD-10-CM

## 2014-03-08 DIAGNOSIS — S3991XA Unspecified injury of abdomen, initial encounter: Secondary | ICD-10-CM | POA: Insufficient documentation

## 2014-03-08 DIAGNOSIS — Z79899 Other long term (current) drug therapy: Secondary | ICD-10-CM | POA: Insufficient documentation

## 2014-03-08 DIAGNOSIS — Z880 Allergy status to penicillin: Secondary | ICD-10-CM | POA: Insufficient documentation

## 2014-03-08 DIAGNOSIS — J45909 Unspecified asthma, uncomplicated: Secondary | ICD-10-CM | POA: Insufficient documentation

## 2014-03-08 LAB — COMPREHENSIVE METABOLIC PANEL
ALBUMIN: 3.8 g/dL (ref 3.5–5.2)
ALK PHOS: 36 U/L — AB (ref 39–117)
ALT: 24 U/L (ref 0–53)
AST: 26 U/L (ref 0–37)
Anion gap: 14 (ref 5–15)
BUN: 12 mg/dL (ref 6–23)
CO2: 23 meq/L (ref 19–32)
Calcium: 8.8 mg/dL (ref 8.4–10.5)
Chloride: 103 mEq/L (ref 96–112)
Creatinine, Ser: 0.91 mg/dL (ref 0.50–1.35)
GFR calc Af Amer: >90 mL/min (ref >90)
GFR calc non Af Amer: >90 mL/min (ref >90)
GLUCOSE: 92 mg/dL (ref 70–99)
Potassium: 4.1 mEq/L (ref 3.7–5.3)
Sodium: 140 mEq/L (ref 137–147)
TOTAL PROTEIN: 7 g/dL (ref 6.0–8.3)
Total Bilirubin: 0.5 mg/dL (ref 0.3–1.2)

## 2014-03-08 LAB — URINALYSIS, ROUTINE W REFLEX MICROSCOPIC
Bilirubin Urine: NEGATIVE
Glucose, UA: NEGATIVE mg/dL
Hgb urine dipstick: NEGATIVE
Ketones, ur: NEGATIVE mg/dL
Leukocytes, UA: NEGATIVE
Nitrite: NEGATIVE
Protein, ur: NEGATIVE mg/dL
Specific Gravity, Urine: 1.02 (ref 1.005–1.030)
Urobilinogen, UA: 0.2 mg/dL (ref 0.0–1.0)
pH: 5 (ref 5.0–8.0)

## 2014-03-08 LAB — CBC WITH DIFFERENTIAL/PLATELET
Basophils Absolute: 0 K/uL (ref 0.0–0.1)
Basophils Relative: 0 % (ref 0–1)
Eosinophils Absolute: 0.2 K/uL (ref 0.0–0.7)
Eosinophils Relative: 2 % (ref 0–5)
HCT: 37.9 % — ABNORMAL LOW (ref 39.0–52.0)
Hemoglobin: 12.9 g/dL — ABNORMAL LOW (ref 13.0–17.0)
Lymphocytes Relative: 35 % (ref 12–46)
Lymphs Abs: 3.6 K/uL (ref 0.7–4.0)
MCH: 31.2 pg (ref 26.0–34.0)
MCHC: 34 g/dL (ref 30.0–36.0)
MCV: 91.8 fL (ref 78.0–100.0)
Monocytes Absolute: 1 K/uL (ref 0.1–1.0)
Monocytes Relative: 10 % (ref 3–12)
Neutro Abs: 5.6 K/uL (ref 1.7–7.7)
Neutrophils Relative %: 53 % (ref 43–77)
Platelets: 289 K/uL (ref 150–400)
RBC: 4.13 MIL/uL — ABNORMAL LOW (ref 4.22–5.81)
RDW: 13.8 % (ref 11.5–15.5)
WBC: 10.4 K/uL (ref 4.0–10.5)

## 2014-03-08 LAB — LIPASE, BLOOD: Lipase: 19 U/L (ref 11–59)

## 2014-03-08 MED ORDER — IOHEXOL 300 MG/ML  SOLN
25.0000 mL | Freq: Once | INTRAMUSCULAR | Status: AC | PRN
  Administered 2014-03-08: 25 mL via ORAL

## 2014-03-08 MED ORDER — NAPROXEN 500 MG PO TABS: 500.0000 mg | ORAL_TABLET | Freq: Two times a day (BID) | ORAL | Status: DC

## 2014-03-08 MED ORDER — HYDROCODONE-ACETAMINOPHEN 5-325 MG PO TABS
2.0000 | ORAL_TABLET | Freq: Once | ORAL | Status: AC
  Administered 2014-03-08: 2 via ORAL
  Filled 2014-03-08: qty 2
  Filled 2014-03-08: qty 1

## 2014-03-08 MED ORDER — IOHEXOL 300 MG/ML  SOLN
100.0000 mL | Freq: Once | INTRAMUSCULAR | Status: AC | PRN
  Administered 2014-03-08: 100 mL via INTRAVENOUS

## 2014-03-08 NOTE — ED Notes (Signed)
  Pt transported to ct 

## 2014-03-08 NOTE — Discharge Instructions (Signed)
Your evaluated in the ED today for your testicular groin pain. There is no evidence of an emergent cause for your discomfort. You may follow-up with primary care within the next 3-5 days for further evaluation of your discomfort. Please return to ED for further evaluation if you begin to experienced increased pain, penile discharge, fevers

## 2014-03-08 NOTE — ED Notes (Signed)
Patient transported to CT 
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
## 2014-03-08 NOTE — ED Notes (Signed)
Patient transported to Ultrasound 

## 2014-09-07 ENCOUNTER — Emergency Department (HOSPITAL_COMMUNITY): Payer: Self-pay

## 2014-09-07 ENCOUNTER — Encounter (HOSPITAL_COMMUNITY): Payer: Self-pay

## 2014-09-07 ENCOUNTER — Emergency Department (HOSPITAL_COMMUNITY)
Admission: EM | Admit: 2014-09-07 | Discharge: 2014-09-08 | Disposition: A | Discharge status: Other Acute Inpatient Hospital | Payer: Self-pay | Attending: Emergency Medicine | Admitting: Emergency Medicine

## 2014-09-07 DIAGNOSIS — J45901 Unspecified asthma with (acute) exacerbation: Secondary | ICD-10-CM | POA: Insufficient documentation

## 2014-09-07 DIAGNOSIS — Z880 Allergy status to penicillin: Secondary | ICD-10-CM | POA: Insufficient documentation

## 2014-09-07 DIAGNOSIS — F419 Anxiety disorder, unspecified: Secondary | ICD-10-CM

## 2014-09-07 DIAGNOSIS — R079 Chest pain, unspecified: Secondary | ICD-10-CM | POA: Insufficient documentation

## 2014-09-07 DIAGNOSIS — R45850 Homicidal ideations: Secondary | ICD-10-CM | POA: Insufficient documentation

## 2014-09-07 DIAGNOSIS — Z720 Tobacco use: Secondary | ICD-10-CM | POA: Insufficient documentation

## 2014-09-07 LAB — CBC WITH DIFFERENTIAL/PLATELET
BASOS ABS: 0 10*3/uL (ref 0.0–0.1)
Basophils Relative: 0 % (ref 0–1)
Eosinophils Absolute: 0.1 10*3/uL (ref 0.0–0.7)
Eosinophils Relative: 0 % (ref 0–5)
HCT: 43.2 % (ref 39.0–52.0)
HEMOGLOBIN: 15.1 g/dL (ref 13.0–17.0)
LYMPHS PCT: 6 % — AB (ref 12–46)
Lymphs Abs: 1.2 10*3/uL (ref 0.7–4.0)
MCH: 32.4 pg (ref 26.0–34.0)
MCHC: 35 g/dL (ref 30.0–36.0)
MCV: 92.7 fL (ref 78.0–100.0)
MONO ABS: 1 10*3/uL (ref 0.1–1.0)
Monocytes Relative: 5 % (ref 3–12)
NEUTROS ABS: 16.9 10*3/uL — AB (ref 1.7–7.7)
Neutrophils Relative %: 89 % — ABNORMAL HIGH (ref 43–77)
PLATELETS: 313 10*3/uL (ref 150–400)
RBC: 4.66 MIL/uL (ref 4.22–5.81)
RDW: 14.2 % (ref 11.5–15.5)
WBC: 19.2 10*3/uL — ABNORMAL HIGH (ref 4.0–10.5)

## 2014-09-07 LAB — COMPREHENSIVE METABOLIC PANEL
ALT: 32 U/L (ref 17–63)
AST: 54 U/L — AB (ref 15–41)
Albumin: 3.8 g/dL (ref 3.5–5.0)
Alkaline Phosphatase: 44 U/L (ref 38–126)
Anion gap: 14 (ref 5–15)
BILIRUBIN TOTAL: 1 mg/dL (ref 0.3–1.2)
BUN: 11 mg/dL (ref 6–20)
CHLORIDE: 101 mmol/L (ref 101–111)
CO2: 20 mmol/L — AB (ref 22–32)
CREATININE: 1.61 mg/dL — AB (ref 0.61–1.24)
Calcium: 9.3 mg/dL (ref 8.9–10.3)
GFR calc Af Amer: >60 mL/min (ref >60)
GFR calc non Af Amer: 58 mL/min — ABNORMAL LOW (ref >60)
GLUCOSE: 136 mg/dL — AB (ref 70–99)
Potassium: 3.8 mmol/L (ref 3.5–5.1)
Sodium: 135 mmol/L (ref 135–145)
Total Protein: 7.2 g/dL (ref 6.5–8.1)

## 2014-09-07 LAB — RAPID URINE DRUG SCREEN, HOSP PERFORMED
AMPHETAMINES: NOT DETECTED
BARBITURATES: NOT DETECTED
Benzodiazepines: POSITIVE — AB
COCAINE: POSITIVE — AB
Opiates: NOT DETECTED
TETRAHYDROCANNABINOL: POSITIVE — AB

## 2014-09-07 LAB — ETHANOL

## 2014-09-07 LAB — URINALYSIS, ROUTINE W REFLEX MICROSCOPIC
Bilirubin Urine: NEGATIVE
Glucose, UA: NEGATIVE mg/dL
HGB URINE DIPSTICK: NEGATIVE
KETONES UR: 15 mg/dL — AB
Leukocytes, UA: NEGATIVE
NITRITE: NEGATIVE
PROTEIN: 30 mg/dL — AB
SPECIFIC GRAVITY, URINE: 1.023 (ref 1.005–1.030)
Urobilinogen, UA: 1 mg/dL (ref 0.0–1.0)
pH: 6.5 (ref 5.0–8.0)

## 2014-09-07 LAB — URINE MICROSCOPIC-ADD ON

## 2014-09-07 LAB — D-DIMER, QUANTITATIVE: D-Dimer, Quant: 0.34 ug/mL-FEU (ref 0.00–0.48)

## 2014-09-07 LAB — TROPONIN I: Troponin I: <0.03 ng/mL (ref <0.031)

## 2014-09-07 LAB — ACETAMINOPHEN LEVEL: Acetaminophen (Tylenol), Serum: <10 ug/mL — ABNORMAL LOW (ref 10–30)

## 2014-09-07 LAB — SALICYLATE LEVEL

## 2014-09-07 MED ORDER — IPRATROPIUM-ALBUTEROL 0.5-2.5 (3) MG/3ML IN SOLN
3.0000 mL | Freq: Once | RESPIRATORY_TRACT | Status: DC
  Filled 2014-09-07: qty 3

## 2014-09-07 MED ORDER — SODIUM CHLORIDE 0.9 % IV BOLUS (SEPSIS)
1000.0000 mL | Freq: Once | INTRAVENOUS | Status: AC
  Administered 2014-09-07: 1000 mL via INTRAVENOUS
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
## 2014-09-07 NOTE — ED Notes (Signed)
MD at bedside. 
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
## 2014-09-07 NOTE — BH Assessment (Signed)
Received TTS consult request. Spoke to Dr. Glynn OctaveStephen Rancour who said who has history of bipolar disorder and is off medications. He reports homicidal ideation towards a particular person. Tele-assessment will be initiated. Spoke to Charlett BlakeEmilie Olsen, RN who said she will call TTS when Pt is back from x-ray.  Harlin RainFord Ellis Patsy BaltimoreWarrick Jr, LPC, [facility], [facility] Triage Specialist [phone]

## 2014-09-07 NOTE — ED Notes (Signed)
Security at the bedside

## 2014-09-07 NOTE — Progress Notes (Signed)
Per Dr Manus Gunningancour, pt BS clear bilaterally with no SOB or increased WOB. Ok to d/c HHN tx and not give to pt. Duoneb returned to pyxis.
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
## 2014-09-07 NOTE — ED Notes (Signed)
Provided visitation packet to wife and the bedside, explained policy to change into disposable scrubs and remove jewelry to send home or lock up in security. Patient hesitant to cooperate. callled security to wand and review policy for visitation.

## 2014-09-08 ENCOUNTER — Encounter (HOSPITAL_COMMUNITY): Payer: Self-pay

## 2014-09-08 ENCOUNTER — Inpatient Hospital Stay (HOSPITAL_COMMUNITY)
Admission: AD | Admit: 2014-09-08 | Discharge: 2014-09-10 | DRG: 885 | Disposition: A | Discharge status: Home / Self Care | Payer: Federal, State, Local not specified - Other | Attending: Psychiatry | Admitting: Psychiatry

## 2014-09-08 DIAGNOSIS — F1320 Sedative, hypnotic or anxiolytic dependence, uncomplicated: Secondary | ICD-10-CM | POA: Diagnosis not present

## 2014-09-08 DIAGNOSIS — R45850 Homicidal ideations: Secondary | ICD-10-CM | POA: Diagnosis present

## 2014-09-08 DIAGNOSIS — F419 Anxiety disorder, unspecified: Secondary | ICD-10-CM | POA: Diagnosis present

## 2014-09-08 DIAGNOSIS — F1420 Cocaine dependence, uncomplicated: Secondary | ICD-10-CM | POA: Diagnosis not present

## 2014-09-08 DIAGNOSIS — F1220 Cannabis dependence, uncomplicated: Secondary | ICD-10-CM

## 2014-09-08 DIAGNOSIS — F319 Bipolar disorder, unspecified: Secondary | ICD-10-CM | POA: Diagnosis present

## 2014-09-08 DIAGNOSIS — F17210 Nicotine dependence, cigarettes, uncomplicated: Secondary | ICD-10-CM | POA: Diagnosis present

## 2014-09-08 DIAGNOSIS — Z62810 Personal history of physical and sexual abuse in childhood: Secondary | ICD-10-CM | POA: Diagnosis present

## 2014-09-08 DIAGNOSIS — G4700 Insomnia, unspecified: Secondary | ICD-10-CM | POA: Diagnosis present

## 2014-09-08 DIAGNOSIS — F3162 Bipolar disorder, current episode mixed, moderate: Principal | ICD-10-CM

## 2014-09-08 DIAGNOSIS — Z818 Family history of other mental and behavioral disorders: Secondary | ICD-10-CM | POA: Diagnosis not present

## 2014-09-08 DIAGNOSIS — J45909 Unspecified asthma, uncomplicated: Secondary | ICD-10-CM | POA: Diagnosis present

## 2014-09-08 HISTORY — DX: Depression, unspecified: F32.A

## 2014-09-08 HISTORY — DX: Major depressive disorder, single episode, unspecified: F32.9

## 2014-09-08 LAB — TROPONIN I: Troponin I: <0.03 ng/mL (ref <0.031)

## 2014-09-08 LAB — I-STAT VENOUS BLOOD GAS, ED
Acid-base deficit: 3 mmol/L — ABNORMAL HIGH (ref 0.0–2.0)
Bicarbonate: 21.9 mEq/L (ref 20.0–24.0)
O2 Saturation: 91 %
PH VEN: 7.382 — AB (ref 7.250–7.300)
PO2 VEN: 62 mmHg — AB (ref 30.0–45.0)
TCO2: 23 mmol/L (ref 0–100)
pCO2, Ven: 36.8 mmHg — ABNORMAL LOW (ref 45.0–50.0)

## 2014-09-08 LAB — BASIC METABOLIC PANEL
ANION GAP: 14 (ref 5–15)
BUN: 10 mg/dL (ref 6–20)
CHLORIDE: 105 mmol/L (ref 101–111)
CO2: 18 mmol/L — ABNORMAL LOW (ref 22–32)
Calcium: 8.2 mg/dL — ABNORMAL LOW (ref 8.9–10.3)
Creatinine, Ser: 1.61 mg/dL — ABNORMAL HIGH (ref 0.61–1.24)
GFR calc Af Amer: >60 mL/min (ref >60)
GFR calc non Af Amer: 58 mL/min — ABNORMAL LOW (ref >60)
GLUCOSE: 83 mg/dL (ref 70–99)
Potassium: 3.7 mmol/L (ref 3.5–5.1)
Sodium: 137 mmol/L (ref 135–145)

## 2014-09-08 LAB — I-STAT CHEM 8, ED
BUN: 14 mg/dL (ref 6–20)
Calcium, Ion: 1.1 mmol/L — ABNORMAL LOW (ref 1.12–1.23)
Chloride: 105 mmol/L (ref 101–111)
Creatinine, Ser: 1.6 mg/dL — ABNORMAL HIGH (ref 0.61–1.24)
GLUCOSE: 83 mg/dL (ref 70–99)
HEMATOCRIT: 43 % (ref 39.0–52.0)
HEMOGLOBIN: 14.6 g/dL (ref 13.0–17.0)
Potassium: 4.1 mmol/L (ref 3.5–5.1)
Sodium: 140 mmol/L (ref 135–145)
TCO2: 20 mmol/L (ref 0–100)

## 2014-09-08 MED ORDER — NICOTINE 21 MG/24HR TD PT24
21.0000 mg | MEDICATED_PATCH | Freq: Every day | TRANSDERMAL | Status: DC
  Administered 2014-09-08 – 2014-09-10 (×3): 21 mg via TRANSDERMAL
  Filled 2014-09-08 (×4): qty 1

## 2014-09-08 MED ORDER — ACETAMINOPHEN 325 MG PO TABS: 650.0000 mg | ORAL_TABLET | Freq: Four times a day (QID) | ORAL | Status: DC | PRN

## 2014-09-08 MED ORDER — TRAZODONE HCL 50 MG PO TABS
50.0000 mg | ORAL_TABLET | Freq: Every evening | ORAL | Status: DC | PRN
  Administered 2014-09-09: 50 mg via ORAL
  Filled 2014-09-08: qty 1
  Filled 2014-09-08: qty 28

## 2014-09-08 MED ORDER — IBUPROFEN 600 MG PO TABS
600.0000 mg | ORAL_TABLET | ORAL | Status: DC | PRN
  Administered 2014-09-08 – 2014-09-09 (×2): 600 mg via ORAL
  Filled 2014-09-08 (×2): qty 1

## 2014-09-08 MED ORDER — LORAZEPAM 1 MG PO TABS
1.0000 mg | ORAL_TABLET | Freq: Every day | ORAL | Status: DC
Start: 2014-09-11 — End: 2014-09-09

## 2014-09-08 MED ORDER — ADULT MULTIVITAMIN W/MINERALS CH
1.0000 | ORAL_TABLET | Freq: Every day | ORAL | Status: DC
  Administered 2014-09-09 – 2014-09-10 (×2): 1 via ORAL
  Filled 2014-09-08 (×5): qty 1

## 2014-09-08 MED ORDER — LORAZEPAM 1 MG PO TABS: 1.0000 mg | ORAL_TABLET | Freq: Four times a day (QID) | ORAL | Status: DC | PRN

## 2014-09-08 MED ORDER — DIVALPROEX SODIUM ER 500 MG PO TB24
1000.0000 mg | ORAL_TABLET | Freq: Every evening | ORAL | Status: DC
  Filled 2014-09-08 (×2): qty 2

## 2014-09-08 MED ORDER — THIAMINE HCL 100 MG/ML IJ SOLN: 100.0000 mg | Freq: Once | INTRAMUSCULAR | Status: DC

## 2014-09-08 MED ORDER — PRAZOSIN HCL 1 MG PO CAPS
1.0000 mg | ORAL_CAPSULE | Freq: Every day | ORAL | Status: DC
  Administered 2014-09-08 – 2014-09-09 (×2): 1 mg via ORAL
  Filled 2014-09-08 (×3): qty 1

## 2014-09-08 MED ORDER — HYDROXYZINE HCL 25 MG PO TABS
25.0000 mg | ORAL_TABLET | Freq: Four times a day (QID) | ORAL | Status: DC | PRN
  Filled 2014-09-08: qty 30

## 2014-09-08 MED ORDER — LOPERAMIDE HCL 2 MG PO CAPS: 2.0000 mg | ORAL_CAPSULE | ORAL | Status: DC | PRN

## 2014-09-08 MED ORDER — ONDANSETRON 4 MG PO TBDP
4.0000 mg | ORAL_TABLET | Freq: Four times a day (QID) | ORAL | Status: DC | PRN
Start: 2014-09-08 — End: 2014-09-10

## 2014-09-08 MED ORDER — ALUM & MAG HYDROXIDE-SIMETH 200-200-20 MG/5ML PO SUSP: 30.0000 mL | ORAL | Status: DC | PRN

## 2014-09-08 MED ORDER — SODIUM CHLORIDE 0.9 % IV BOLUS (SEPSIS)
1000.0000 mL | Freq: Once | INTRAVENOUS | Status: AC
  Administered 2014-09-08: 1000 mL via INTRAVENOUS

## 2014-09-08 MED ORDER — HALOPERIDOL 5 MG PO TABS
10.0000 mg | ORAL_TABLET | Freq: Two times a day (BID) | ORAL | Status: DC
  Administered 2014-09-08 – 2014-09-09 (×3): 10 mg via ORAL
  Filled 2014-09-08 (×5): qty 2

## 2014-09-08 MED ORDER — MAGNESIUM HYDROXIDE 400 MG/5ML PO SUSP: 30.0000 mL | Freq: Every day | ORAL | Status: DC | PRN

## 2014-09-08 MED ORDER — LORAZEPAM 1 MG PO TABS
1.0000 mg | ORAL_TABLET | Freq: Four times a day (QID) | ORAL | Status: AC
  Administered 2014-09-08 (×2): 1 mg via ORAL
  Filled 2014-09-08 (×3): qty 1

## 2014-09-08 MED ORDER — VITAMIN B-1 100 MG PO TABS
100.0000 mg | ORAL_TABLET | Freq: Every day | ORAL | Status: DC
  Administered 2014-09-09 – 2014-09-10 (×2): 100 mg via ORAL
  Filled 2014-09-08 (×3): qty 1

## 2014-09-08 MED ORDER — LORAZEPAM 1 MG PO TABS
1.0000 mg | ORAL_TABLET | Freq: Three times a day (TID) | ORAL | Status: DC
  Administered 2014-09-09: 1 mg via ORAL
  Filled 2014-09-08: qty 1

## 2014-09-08 MED ORDER — BENZTROPINE MESYLATE 2 MG PO TABS
2.0000 mg | ORAL_TABLET | Freq: Two times a day (BID) | ORAL | Status: DC
  Administered 2014-09-08 – 2014-09-09 (×3): 2 mg via ORAL
  Filled 2014-09-08 (×5): qty 1

## 2014-09-08 MED ORDER — LORAZEPAM 1 MG PO TABS
1.0000 mg | ORAL_TABLET | Freq: Two times a day (BID) | ORAL | Status: DC
Start: 2014-09-10 — End: 2014-09-09

## 2014-09-08 NOTE — ED Provider Notes (Signed)
Assumed care of pt from Dr. Manus Gunningancour, briefly, pt is a 24 y.o. male presenting with dyspnea, ams.  On assumption of care awaiting repeat labs. These were improved compared to previous.  VBG with respiratory alkalosis, likely combination of contraction alkalosis.  [facility]  Mirian MoMatthew Gentry, MD 09/08/14 [id number]

## 2014-09-08 NOTE — [facility] Suicide Risk Assessment (Addendum)
[facility] Ad[facility]   Nursing information obtained from:  Patient Demographic factors:  Male Current Mental Status:  Thoughts of violence towards others Loss Factors:  NA Historical Factors:  Prior suicide attempts, Family history of mental illness or substance abuse Risk Reduction Factors:  Responsible for children under 25 years of age, Sense of responsibility to family Total Time spent with patient: 30 minutes Principal Problem: Bipolar 1 disorder, mixed, moderate Diagnosis:   Patient Active Problem List   Diagnosis Date Noted  . Bipolar 1 disorder, mixed, moderate [F31.62] 09/08/2014  . Cocaine use disorder, severe, dependence [F14.20] 09/08/2014  . 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 Cannabis use disorder, severe, dependence [F12.20] 09/08/2014     Continued Clinical Symptoms:    The "Alcohol Use Disorders Identification Test", Guidelines for Use in Primary Care, Second Edition.  [employer] [facility](WHO). Score between 0-7:  no or low risk or alcohol related problems. Score between 8-15:  moderate risk of alcohol related problems. Score between 16-19:  high risk of alcohol related problems. Score 20 or above:  warrants further diagnostic evaluation for alcohol dependence and treatment.   CLINICAL FACTORS:   Bipolar Disorder:   Mixed State   Musculoskeletal: Strength & Muscle Tone: within normal limits Gait & Station: normal Patient leans: [name]  Psychiatric Specialty Exam: Physical Exam  ROS  Blood pressure 111/87, pulse 96, temperature 99.1 F (37.3 C), temperature source Oral, resp. rate 16, height 5\' 8"  (1.727 m), weight 58.06 kg (128 lb).Body mass index is 19.47 kg/(m^2).  Please see H&P.                                                        COGNITIVE FEATURES THAT CONTRIBUTE TO RISK:  Closed-mindedness, Polarized thinking and Thought constriction (tunnel vision)    SUICIDE  RISK:   Moderate:  Frequent suicidal ideation with limited intensity, and duration, some specificity in terms of plans, no associated intent, good self-control, limited dysphoria/symptomatology, some risk factors present, and identifiable protective factors, including available and accessible social support.  PLAN OF CARE:Please see H&P.   Medical Decision Making:  Review of Psycho-Social Stressors (1), Review or order clinical lab tests (1), Established Problem, Worsening (2), Review of Last Therapy Session (1), Review of Medication Regimen & Side Effects (2) and Review of New Medication or Change in Dosage (2)  I certify that inpatient services furnished can reasonably be expected to improve the patient's condition.   Sunjai Levandoski MD 09/08/2014, 4:34 PM
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
## 2014-09-08 NOTE — Progress Notes (Signed)
D: Patient denies SI/HI and A/V hallucinations; patient denies withdrawal symptoms; patient refused Depakote at 1800 hrs and reports that it causing him lock jaw; patient denies all pain and did not use wheelchair and ambulated with the walker  A: Monitored q 15 minutes; patient encouraged to attend groups; patient educated about medications; patient given medications per physician orders; patient encouraged to express feelings and/or concerns  R: Patient has been in the bed sleeping most of the day except during meals; patient refused to get out of the bed around 1400 hrs to get medications; patient's interaction with staff and peers is isolative; patient conversation is pleasant;  patient was able to set goal to talk with staff 1:1 when having feelings of SI; patient is taking medications as prescribed and tolerating medications; patient is attending all groups
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
## 2014-09-08 NOTE — [facility] Group Notes (Signed)
[facility] LCSW Group Therapy  09/08/2014 1:15 pm  Type of Therapy: Process Group Therapy  Participation Level:  Active  Participation Quality:  Appropriate  Affect:  Flat  Cognitive:  Oriented  Insight:  Improving  Engagement in Group:  Limited  Engagement in Therapy:  Limited  Modes of Intervention:  Activity, Clarification, Education, Problem-solving and Support  Summary of Progress/Problems: Today's group addressed the issue of overcoming obstacles.  Patients were asked to identify their biggest obstacle post d/c that stands in the way of their on-going success, and then problem solve as to how to manage this.  Invited.  Chose to not attend  Raymond Church, Raymond Church 09/08/2014   3:07 PM

## 2014-09-08 NOTE — Progress Notes (Signed)
D: Pt denies SI/HI/AVH and no pain. Pt is  cooperative. Pt forwards little, isolates to room, pt not forthcoming with information.   A: Pt was offered support and encouragement. Pt was given scheduled medications. Pt was encourage to attend groups. Q 15 minute checks were done for safety.    R:Pt attends groups and interacts well with peers and staff. Pt is taking medication. Pt has no complaints at this time .Pt receptive to treatment and safety maintained on unit.

## 2014-09-08 NOTE — Plan of Care (Signed)
Problem: Alteration in mood & ability to function due to Goal: LTG-Pt reports reduction in suicidal thoughts (Patient reports reduction in suicidal thoughts and is able to verbalize a safety plan for whenever patient is feeling suicidal)  Outcome: Progressing Pt denies SI at this time  Goal: STG-Patient will comply with prescribed medication regimen (Patient will comply with prescribed medication regimen)  Outcome: Progressing Pt taking medications as prescribed

## 2014-09-08 NOTE — [facility] Suicide Risk Assessment (Signed)
[facility] INPATIENT:  Family/Significant Other Suicide Prevention Education  Suicide Prevention Education:  Patient Refusal for Family/Significant Other Suicide Prevention Education: The patient Raymond Church has refused to provide written consent for family/significant other to be provided Family/Significant Other Suicide Prevention Education during admission and/or prior to discharge.  Physician notified. SPE reviewed with patient and brochure provided. Patient encouraged to return to hospital if having suicidal thoughts, patient verbalized his/her understanding and has no further questions at this time.   Barrett Holthaus, West CarboKristin L 09/08/2014, 4:29 PM

## 2014-09-08 NOTE — Tx Team (Addendum)
Initial Interdisciplinary Treatment Plan   PATIENT STRESSORS: Medication change or noncompliance Substance abuse   PATIENT STRENGTHS: Average or above average intelligence Supportive family/friends   PROBLEM LIST: Problem List/Patient Goals Date to be addressed Date deferred Reason deferred Estimated date of resolution  HI      Substance abuse            Pt reports I have no goal, "I just want to see my son."            Risk for suicide                         DISCHARGE CRITERIA:  Adequate post-discharge living arrangements  PRELIMINARY DISCHARGE PLAN: Return to previous living arrangement  PATIENT/FAMIILY INVOLVEMENT: This treatment plan has been presented to and reviewed with the patient, Edman CircleDanyel W Tinkham, and/or family member.  The patient and family have been given the opportunity to ask questions and make suggestions.  Floyce StakesGarrison, Jacqueline 09/08/2014, 5:41 AM
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
## 2014-09-08 NOTE — [facility] Group Notes (Signed)
[facility]   09/08/2014 10:42 AM  Participation Quality:  Invited.  Chose to not attend    [name], Raymond Church

## 2014-09-08 NOTE — Progress Notes (Signed)
Patient at this time lacks capacity to participate in disposition planning. Will re-evaluate tomorrow.  Jomarie LongsSaramma Kelsay Haggard ,MD Attending Psychiatrist  [facility]

## 2014-09-09 MED ORDER — DIVALPROEX SODIUM ER 500 MG PO TB24
750.0000 mg | ORAL_TABLET | Freq: Every evening | ORAL | Status: DC
  Filled 2014-09-09 (×2): qty 1

## 2014-09-09 MED ORDER — HALOPERIDOL 5 MG PO TABS
10.0000 mg | ORAL_TABLET | Freq: Every evening | ORAL | Status: DC
  Administered 2014-09-09: 10 mg via ORAL
  Filled 2014-09-09 (×2): qty 2

## 2014-09-09 MED ORDER — BENZTROPINE MESYLATE 1 MG PO TABS
1.0000 mg | ORAL_TABLET | Freq: Two times a day (BID) | ORAL | Status: DC
  Administered 2014-09-09 – 2014-09-10 (×2): 1 mg via ORAL
  Filled 2014-09-09 (×4): qty 1

## 2014-09-09 MED ORDER — HALOPERIDOL 5 MG PO TABS
5.0000 mg | ORAL_TABLET | Freq: Every morning | ORAL | Status: DC
  Administered 2014-09-10: 5 mg via ORAL
  Filled 2014-09-09 (×2): qty 1
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
## 2014-09-09 NOTE — Progress Notes (Signed)
Patient lacks capacity to participate in disposition planning.  Blade Scheff ,MD Attending Psychiatrist  [facility]  

## 2014-09-09 NOTE — Plan of Care (Signed)
Problem: Diagnosis: Increased Risk For Suicide Attempt Goal: STG-Patient Will Attend All Groups On The Unit Outcome: Not Progressing Pt remains isolative to his room. Refused to attend scheduled unit groups despite multiple verbals prompts. Safety maintained on Q 15 minutes checks as per order.
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
## 2014-09-09 NOTE — [facility] Group Notes (Signed)
[facility] Group Note: Recovery  Date:  09/09/2014  Time:  10:26 AM  Type of Therapy:  Nurse Education  Participation Level:  Did Not Attend  Participation Quality:  Inattentive  Affect:  Blunted  Cognitive:  Lacking  Insight:  None  Engagement in Group:  Lacking  Modes of Intervention:  Discussion  Summary of Progress/Problems:Pt did not attend  Rodman KeyWebb, Safiatou Islam [facility] 09/09/2014, 10:26 AM

## 2014-09-09 NOTE — [facility] Group Notes (Signed)
[facility] LCSW Group Therapy  09/09/2014 , 1:22 PM   Type of Therapy:  Group Therapy  Participation Level:  Invited.  Chose to not attend  Summary of Progress/Problems: Today's group focused on the term Diagnosis.  Participants were asked to define the term, and then pronounce whether it is a negative, positive or neutral term.  Daryel Geraldorth, Manie Bealer B 09/09/2014 , 1:22 PM

## 2014-09-09 NOTE — Progress Notes (Signed)
D: Pt stayed in bed majority of this morning. Pt refused to attend schduled groups when prompted by staff. Pt was sedated this afternoon, was unable to stand still or stay awake for noon vitals. Went off ward to courtyard with peers and staff for recreation time and to the cafeteria for meals. A: Shift assessment done with pt and needs assessed. Encouragement offered to pt to voiced concerns. Support and availability provided to this pt. Medication education done with pt prior to medication administration.  Noon dose of Ativan was held due to sedation and Dr. Abelina BachelorEppen made aware. Q 15 minutes checks remains effective as per order and pt monitored as such without incident thus far.  R: Pt denied SI, HI, AVH and pain at time of assessment. Pt refused his scheduled 1800 dose of Depakote 750 mg PO. Pt stated to writer " I don't want it, it gives me lock jaw, I took it before". Verbal education done ordered medication to increase insight and promote compliance but to no avail. Safety maintained on / off unit. Continue POC.

## 2014-09-10 ENCOUNTER — Encounter (HOSPITAL_COMMUNITY): Payer: Self-pay | Admitting: Registered Nurse

## 2014-09-10 MED ORDER — TRAZODONE HCL 50 MG PO TABS: 50.0000 mg | ORAL_TABLET | Freq: Every evening | ORAL | Status: DC | PRN

## 2014-09-10 MED ORDER — DIVALPROEX SODIUM ER 250 MG PO TB24: 750.0000 mg | ORAL_TABLET | Freq: Every evening | ORAL | Status: DC

## 2014-09-10 MED ORDER — PRAZOSIN HCL 1 MG PO CAPS: 1.0000 mg | ORAL_CAPSULE | Freq: Every day | ORAL | Status: DC

## 2014-09-10 MED ORDER — DIVALPROEX SODIUM ER 250 MG PO TB24
750.0000 mg | ORAL_TABLET | Freq: Every day | ORAL | Status: DC
  Filled 2014-09-10: qty 42

## 2014-09-10 MED ORDER — NICOTINE 21 MG/24HR TD PT24: 21.0000 mg | MEDICATED_PATCH | Freq: Every day | TRANSDERMAL | Status: DC

## 2014-09-10 MED ORDER — ADULT MULTIVITAMIN W/MINERALS CH: 1.0000 | ORAL_TABLET | Freq: Every day | ORAL | Status: DC

## 2014-09-10 MED ORDER — HYDROXYZINE HCL 25 MG PO TABS: 25.0000 mg | ORAL_TABLET | Freq: Four times a day (QID) | ORAL | Status: DC | PRN

## 2014-09-10 MED ORDER — BENZTROPINE MESYLATE 1 MG PO TABS: 1.0000 mg | ORAL_TABLET | Freq: Two times a day (BID) | ORAL | Status: DC

## 2014-09-10 MED ORDER — HALOPERIDOL 5 MG PO TABS: ORAL_TABLET | ORAL | Status: DC

## 2014-09-10 NOTE — Progress Notes (Signed)
  [facility] Dis[facility] :  Will you be returning to the same living situation after discharge:  Yes,  home At discharge, do you have transportation home?: Yes,  bus pass Do you have the ability to pay for your medications: Yes,  mental health  Release of information consent forms completed and in the chart;  Patient's signature needed at discharge.  Patient to Follow up at: Follow-up Information    [facility]   Specialty:  Behavioral Health   Why:  Walk-in clinic Monday-Friday between 8 am and 3 pm for assessment for medication management services.    Contact information:   [address] [place] [address] [phone]       Follow up with [facility].   Why:  Go to the walk-in clinic next Tuesday between 9 and 1 for an assessment for services.  They provide an array of services for substance abuse treatment   Contact information:   [address]  [place]  [[phone]      Patient denies SI/HI: Yes,  yes    Safety Planning and Suicide Prevention discussed: Yes,  yes  Have you used any form of tobacco in the last 30 days? (Cigarettes, Smokeless Tobacco, Cigars, and/or Pipes): Yes  Has patient been referred to the Quitline?: Yes, faxed on 09/10/14  Ida Rogueorth, Aiyanah Kalama B 09/10/2014, 10:23 AM
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
## 2014-09-10 NOTE — Progress Notes (Signed)
D: Pt denies SI/HI/AVH. Pt is pleasant and cooperative. Pt stayed in bed much of the evening. Pt did get up and interact with staff talking and joking. Pt was observed interacting with other patients on the unit also.   A: Pt was offered support and encouragement. Pt was given scheduled medications. Pt was encourage to attend groups. Q 15 minute checks were done for safety.   R:Pt attends groups and interacts well with peers and staff. Pt is taking medication. Pt has no complaints at this time .Pt receptive to treatment and safety maintained on unit.

## 2014-09-10 NOTE — Tx Team (Signed)
Interdisciplinary Treatment Plan Update (Adult)  Date:  09/10/2014   Time Reviewed:  11:15 AM   Progress in Treatment: Attending groups: Yes. Participating in groups:  Yes. Taking medication as prescribed:  Yes. Tolerating medication:  Yes. Family/Significant othe contact made:   Patient understands diagnosis:  Yes   Discussing patient identified problems/goals with staff:  Yes, see initial care plan. Medical problems stabilized or resolved:  Yes. Denies suicidal/homicidal ideation: Yes. Issues/concerns per patient self-inventory:  No. Other:  New problem(s) identified:  Discharge Plan or Barriers:  Return home, follow up outpt  Reason for Continuation of Hospitalization:   Comments:  Estimated length of stay:  D/C today  New goal(s):  Review of initial/current patient goals per problem list:     Attendees: Patient:  09/10/2014 11:15 AM   Family:   09/10/2014 11:15 AM   Physician:  Jomarie LongsSaramma Eappen, MD 09/10/2014 11:15 AM   Nursing:   Marzetta Boardhrista Dopson, RN 09/10/2014 11:15 AM   CSW:    Daryel Geraldodney Croix Presley, LCSW   09/10/2014 11:15 AM   Other:  09/10/2014 11:15 AM   Other:   09/10/2014 11:15 AM   Other:  Onnie BoerJennifer Clark, Nurse CM 09/10/2014 11:15 AM   Other:  Leisa LenzValerie Enoch, [facility] 09/10/2014 11:15 AM   Other:  Tomasita Morrowelora Sutton, P4CC  09/10/2014 11:15 AM   Other:  09/10/2014 11:15 AM   Other:  09/10/2014 11:15 AM   Other:  09/10/2014 11:15 AM   Other:  09/10/2014 11:15 AM   Other:  09/10/2014 11:15 AM   Other:   09/10/2014 11:15 AM    Scribe for Treatment Team:   Ida RogueNorth, Braylynn Ghan B, 09/10/2014 11:15 AM

## 2014-09-10 NOTE — Progress Notes (Signed)
Patient ID: Raymond CircleDanyel W Gura, male   DOB: 22-Feb-1991, 24 y.o.   MRN: [id number]  Pt. Denies SI/HI and A/V hallucinations. Belongings returned to patient at time of discharge. Patient denies any pain or discomfort. Discharge instructions and medications were reviewed with patient. Patient verbalized understanding of both medications and discharge instructions. Patient was walked to lobby and given a bus pass. Q15 minute safety checks maintained until discharge.

## 2014-09-10 NOTE — [facility] Suicide Risk Assessment (Signed)
[facility]   Demographic Factors:  Male  Total Time spent with patient: 30 minutes  Musculoskeletal: Strength & Muscle Tone: within normal limits Gait & Station: normal Patient leans: [name]  Psychiatric Specialty Exam: Physical Exam  ROS  Blood pressure 102/74, pulse 107, temperature 99.1 F (37.3 C), temperature source Oral, resp. rate 16, height 5\' 8"  (1.727 m), weight 58.06 kg (128 lb).Body mass index is 19.47 kg/(m^2).  General Appearance: Casual  Eye Contact::  Good  Speech:  Clear and Coherent409  Volume:  Normal  Mood:  Euthymic  Affect:  Congruent  Thought Process:  Coherent  Orientation:  Full (Time, Place, and Person)  Thought Content:  WDL  Suicidal Thoughts:  No  Homicidal Thoughts:  No  Memory:  Immediate;   Fair Recent;   Fair Remote;   Fair  Judgement:  Fair  Insight:  Fair  Psychomotor Activity:  Normal  Concentration:  Fair  Recall:  FiservFair  Fund of Knowledge:Fair  Language: Fair  Akathisia:  No  Handed:  Right  AIMS (if indicated):     Assets:  Communication Skills Desire for Improvement Physical Health Social Support Talents/Skills  Sleep:  Number of Hours: 0.25  Cognition: WNL  ADL's:  Intact   Have you used any form of tobacco in the last 30 days? (Cigarettes, Smokeless Tobacco, Cigars, and/or Pipes): Yes  Has this patient used any form of tobacco in the last 30 days? (Cigarettes, Smokeless Tobacco, Cigars, and/or Pipes) Yes, A prescription for NICOTINE PATCH given.  Mental Status Per Nursing Assessment::   On Admission:  Thoughts of violence towards others  Current Mental Status by Physician: patient denies SI/HI/AH/VH  Loss Factors: Relational struggles  Historical Factors: Impulsivity  Risk Reduction Factors:   Employed and Positive social support  Continued Clinical Symptoms:  Alcohol/Substance Abuse/Dependencies Previous Psychiatric Diagnoses and Treatments  Cognitive Features That Contribute To  Risk:  None    Suicide Risk:  Minimal: No identifiable suicidal ideation.  Patients presenting with no risk factors but with morbid ruminations; may be classified as minimal risk based on the severity of the depressive symptoms  Principal Problem: Bipolar 1 disorder, mixed, moderate Discharge Diagnoses:  Patient Active Problem List   Diagnosis Date Noted  . Bipolar 1 disorder, mixed, moderate [F31.62] 09/08/2014  . Cocaine use disorder, severe, dependence [F14.20] 09/08/2014  . Cannabis use disorder, severe, dependence [F12.20] 09/08/2014    Follow-up Information    [facility]   Specialty:  Behavioral Health   Why:  Walk-in clinic Monday-Friday between 8 am and 3 pm for assessment for therapy and medication management services.    Contact information:   [address] [place] [id number] [phone]       Plan Of Care/Follow-up recommendations:  Activity:  no restrictions Diet:  regular Tests:  Depakote level in 1 week Other:  follow up with [facility] for alcohol and substance abuse issues , [facility]  Is patient on multiple antipsychotic therapies at discharge:  No   Has Patient had three or more failed trials of antipsychotic monotherapy by history:  No  Recommended Plan for Multiple Antipsychotic Therapies: NA    Courtenay Hirth MD 09/10/2014, 9:31 AM

## 2014-09-11 LAB — VALPROIC ACID LEVEL: Valproic Acid Lvl: <10 ug/mL — ABNORMAL LOW (ref 50.0–100.0)

## 2015-07-03 ENCOUNTER — Emergency Department (HOSPITAL_COMMUNITY)
Admission: EM | Admit: 2015-07-03 | Discharge: 2015-07-04 | Disposition: A | Discharge status: Home / Self Care | Payer: Self-pay | Attending: Emergency Medicine | Admitting: Emergency Medicine

## 2015-07-03 ENCOUNTER — Encounter (HOSPITAL_COMMUNITY): Payer: Self-pay | Admitting: *Deleted

## 2015-07-03 DIAGNOSIS — J45909 Unspecified asthma, uncomplicated: Secondary | ICD-10-CM | POA: Insufficient documentation

## 2015-07-03 DIAGNOSIS — F329 Major depressive disorder, single episode, unspecified: Secondary | ICD-10-CM | POA: Insufficient documentation

## 2015-07-03 DIAGNOSIS — Z880 Allergy status to penicillin: Secondary | ICD-10-CM | POA: Insufficient documentation

## 2015-07-03 DIAGNOSIS — Z79899 Other long term (current) drug therapy: Secondary | ICD-10-CM | POA: Insufficient documentation

## 2015-07-03 DIAGNOSIS — N4889 Other specified disorders of penis: Secondary | ICD-10-CM | POA: Insufficient documentation

## 2015-07-03 DIAGNOSIS — F17210 Nicotine dependence, cigarettes, uncomplicated: Secondary | ICD-10-CM | POA: Insufficient documentation

## 2015-07-03 DIAGNOSIS — Z202 Contact with and (suspected) exposure to infections with a predominantly sexual mode of transmission: Secondary | ICD-10-CM | POA: Insufficient documentation

## 2015-07-03 DIAGNOSIS — R369 Urethral discharge, unspecified: Secondary | ICD-10-CM | POA: Insufficient documentation

## 2015-07-03 MED ORDER — CEFTRIAXONE SODIUM 250 MG IJ SOLR
250.0000 mg | Freq: Once | INTRAMUSCULAR | Status: AC
  Administered 2015-07-03: 250 mg via INTRAMUSCULAR
  Filled 2015-07-03: qty 250

## 2015-07-03 MED ORDER — AZITHROMYCIN 250 MG PO TABS
1000.0000 mg | ORAL_TABLET | Freq: Once | ORAL | Status: AC
  Administered 2015-07-03: 1000 mg via ORAL
  Filled 2015-07-03: qty 4

## 2015-07-03 NOTE — ED Notes (Signed)
The pt thinks he has a std.  Drainage on his penis after having sex with his girlfriend

## 2015-07-03 NOTE — ED Provider Notes (Signed)
CSN: [id number]     Arrival date & time 07/03/15  2255 History  By signing my name below, I, [facility] Center[facility]arrissa Church, attest that this documentation has been prepared under the direction and in the presence of Costco WholesaleJaime Pilcher Ward, [employer] CorporationPA-C. Electronically Signed: Randell PatientMarrissa Church, ED Scribe. 07/03/2015. 1:37 AM.   Chief Complaint  Patient presents with  . Exposure to STD   The history is provided by the patient. No language interpreter was used.   HPI Comments: Raymond Church is a 25 y.o. male who presents to the Emergency Department for exposure to an STD. Patient reports that he has had sex with his girlfriend who tested positive for chlamydia 3 days ago. He endorses associated penile discharge. He notes similar symptoms in the past when he tested positive for chlamydia. He denies fever, dysuria, and abdominal pain.  Past Medical History  Diagnosis Date  . Asthma   . Depression    History reviewed. No pertinent past surgical history. Family History  Problem Relation Age of Onset  . Bipolar disorder Mother   . Drug abuse Mother    Social History  Substance Use Topics  . Smoking status: Current Every Day Smoker -- 0.25 packs/day for 10 years    Types: Cigarettes  . Smokeless tobacco: None  . Alcohol Use: Yes     Comment: occasional     Review of Systems  Constitutional: Negative for fever.  Genitourinary: Positive for discharge and penile pain.      Allergies  Shellfish allergy; Amoxicillin; Mushroom extract complex; and Penicillins  Home Medications   Prior to Admission medications   Medication Sig Start Date End Date Taking? Authorizing Provider  benztropine (COGENTIN) 1 MG tablet Take 1 tablet (1 mg total) by mouth 2 (two) times daily. For drug-induced extrapyramidal reaction 09/10/14   Shuvon B Rankin, NP  divalproex (DEPAKOTE ER) 250 MG 24 hr tablet Take 3 tablets (750 mg total) by mouth every evening. For mood control 09/10/14   Shuvon B Rankin, NP  haloperidol (HALDOL) 5  MG tablet Take 5 mg (one tablet) every morning and take 10 mg (two tablet) every night at bedtime for psychosis 09/10/14   Shuvon B Rankin, NP  hydrOXYzine (ATARAX/VISTARIL) 25 MG tablet Take 1 tablet (25 mg total) by mouth every 6 (six) hours as needed (anxiety/agitation). 09/10/14   Shuvon B Rankin, NP  ibuprofen (ADVIL,MOTRIN) 200 MG tablet Take 400 mg by mouth every 6 (six) hours as needed (pain).    Historical Provider, MD  Multiple Vitamin (MULTIVITAMIN WITH MINERALS) TABS tablet Take 1 tablet by mouth daily. For nutritional support 09/10/14   Shuvon B Rankin, NP  nicotine (NICODERM CQ - DOSED IN MG/24 HOURS) 21 mg/24hr patch Place 1 patch (21 mg total) onto the skin daily. 09/10/14   Shuvon B Rankin, NP  prazosin (MINIPRESS) 1 MG capsule Take 1 capsule (1 mg total) by mouth at bedtime. For PTSD syndrome 09/10/14   Shuvon B Rankin, NP  traZODone (DESYREL) 50 MG tablet Take 1 tablet (50 mg total) by mouth at bedtime and may repeat dose one time if needed. For sleep 09/10/14   Shuvon B Rankin, NP   BP 111/90 mmHg  Pulse 109  Temp(Src) 98.6 F (37 C) (Oral)  Resp 18  SpO2 100% Physical Exam  Constitutional: He is oriented to person, place, and time. He appears well-developed and well-nourished. No distress.  HENT:  Head: Normocephalic and atraumatic.  Cardiovascular: Regular rhythm.   No murmur heard. Pulmonary/Chest: Effort normal  and breath sounds normal. No respiratory distress. He has no wheezes.  Abdominal: Soft. He exhibits no distension. There is no tenderness.  Genitourinary:  Chaperone present for exam. Uncircumcised.  No signs of lesion or erythema on the penis or testicles. The penis and testicles are nontender. No testicular masses or swelling. No signs of any inguinal hernias. No signs of any discharge from the penis. Cremaster reflex present bilaterally.   Musculoskeletal: Normal range of motion.  Neurological: He is alert and oriented to person, place, and time.  Skin: Skin is  warm and dry.  Nursing note and vitals reviewed.   ED Course  Procedures   DIAGNOSTIC STUDIES: Oxygen Saturation is 100% on RA, normal by my interpretation.    COORDINATION OF CARE: 11:40 PM Will order urinalysis. Discussed treatment plan with pt at bedside and pt agreed to plan.   Labs Review Labs Reviewed  URINE CULTURE  URINALYSIS, ROUTINE W REFLEX MICROSCOPIC (NOT AT [facility])  GC/CHLAMYDIA PROBE AMP ([place]) NOT AT [facility]    Imaging Review No results found. I have personally reviewed and evaluated these images and lab results as part of my medical decision-making.   EKG Interpretation None      MDM   Final diagnoses:  Exposure to STD   Patient seen in ED for chlamydial exposure. Prophylactically treated with azithromycin and Rocephin. UA was obtained which was within limits. Urine G&C sent. RPR and HIV testing offered, patient states he recently had this done at health department therefore deferred. Patient advised to inform and treat all sexual partners.  Pt advised on safe sex practices and understands that they have GC/Chlamydia cultures pending and will result in 2-3 days. Pt encouraged to follow up at local health department for future STI checks. No concern for prostatitis or epididymitis. Discussed return precautions. Pt appears safe for discharge.   I personally performed the services described in this documentation, which was scribed in my presence. The recorded information has been reviewed and is accurate.  [facility] Ward, PA-C 07/04/15 0141  Arby Barrette, MD 07/12/15 2241

## 2015-07-04 LAB — URINALYSIS, ROUTINE W REFLEX MICROSCOPIC
Bilirubin Urine: NEGATIVE
Glucose, UA: NEGATIVE mg/dL
Hgb urine dipstick: NEGATIVE
KETONES UR: NEGATIVE mg/dL
Leukocytes, UA: NEGATIVE
NITRITE: NEGATIVE
PH: 5.5 (ref 5.0–8.0)
Protein, ur: NEGATIVE mg/dL
Specific Gravity, Urine: 1.008 (ref 1.005–1.030)

## 2015-07-04 NOTE — ED Notes (Signed)
Pt A&OX4, ambulatory at d/c with steady gait, NAD and pt given graham crackers, four packs of peanut butter, multiple sodas and a carton a milk

## 2015-07-04 NOTE — ED Notes (Signed)
See PAs notes for secondary assessment.  

## 2015-07-04 NOTE — ED Notes (Signed)
Pt states he does not want to have the blood STD tests done because he was just tested two weeks ago. When asked if he had intercourse with anyone else since the last time he was tested he was very vague and stated, "Well.Josefa Half. Im a man." He did not say yes nor did he say no. The PA stated, "Well, if that's a yes then you should be re-tested." Pt initially agreed to be tested but when this RN went in to draw his blood he stated he does not want to be re-tested. PA informed.

## 2015-07-04 NOTE — Discharge Instructions (Signed)
1. Medications: continue usual home medications 2. Treatment: rest, drink plenty of fluids, use a condom with every sexual encounter 3. Follow Up: Please follow up with your primary doctor in 3 days for discussion of your diagnoses and further evaluation after today's visit; Please return to the ER for worsening symptoms, high fevers or persistent vomiting.  You have been tested for HIV, syphilis, chlamydia and gonorrhea. These results will be available in approximately 3 days. You will be notified if they are positive.   It is very important to practice safe sex and use condoms when sexually active. If your results are positive you need to notify all sexual partners so they can be treated as well. The website https://garcia.net/http://www.dontspreadit.com/ can be used to send anonymous text messages or emails to alert sexual contacts.   Follow up with your doctor in regards to today's visit.    SEEK IMMEDIATE MEDICAL CARE IF:  You develop an oral temperature above 102 F (38.9 C), not controlled by medications or lasting more than 2 days.  You develop an increase in pain.  You develop any type of abnormal discharge.  You develop painful intercourse.

## 2015-07-05 LAB — URINE CULTURE: Culture: NO GROWTH

## 2015-07-06 ENCOUNTER — Encounter (HOSPITAL_COMMUNITY): Payer: Self-pay | Admitting: Emergency Medicine

## 2015-07-06 ENCOUNTER — Emergency Department (HOSPITAL_COMMUNITY)
Admission: EM | Admit: 2015-07-06 | Discharge: 2015-07-07 | Payer: Self-pay | Attending: Emergency Medicine | Admitting: Emergency Medicine

## 2015-07-06 DIAGNOSIS — R4689 Other symptoms and signs involving appearance and behavior: Secondary | ICD-10-CM

## 2015-07-06 DIAGNOSIS — F39 Unspecified mood [affective] disorder: Secondary | ICD-10-CM | POA: Insufficient documentation

## 2015-07-06 DIAGNOSIS — F17210 Nicotine dependence, cigarettes, uncomplicated: Secondary | ICD-10-CM | POA: Insufficient documentation

## 2015-07-06 DIAGNOSIS — F1210 Cannabis abuse, uncomplicated: Secondary | ICD-10-CM | POA: Insufficient documentation

## 2015-07-06 DIAGNOSIS — J45909 Unspecified asthma, uncomplicated: Secondary | ICD-10-CM | POA: Insufficient documentation

## 2015-07-06 DIAGNOSIS — F3162 Bipolar disorder, current episode mixed, moderate: Secondary | ICD-10-CM | POA: Diagnosis present

## 2015-07-06 DIAGNOSIS — Z880 Allergy status to penicillin: Secondary | ICD-10-CM | POA: Insufficient documentation

## 2015-07-06 LAB — COMPREHENSIVE METABOLIC PANEL
ALT: 35 U/L (ref 17–63)
AST: 54 U/L — ABNORMAL HIGH (ref 15–41)
Albumin: 4.5 g/dL (ref 3.5–5.0)
Alkaline Phosphatase: 40 U/L (ref 38–126)
Anion gap: 8 (ref 5–15)
BUN: 15 mg/dL (ref 6–20)
CHLORIDE: 107 mmol/L (ref 101–111)
CO2: 24 mmol/L (ref 22–32)
CREATININE: 1.03 mg/dL (ref 0.61–1.24)
Calcium: 9 mg/dL (ref 8.9–10.3)
GFR calc non Af Amer: >60 mL/min (ref >60)
Glucose, Bld: 135 mg/dL — ABNORMAL HIGH (ref 65–99)
POTASSIUM: 3.5 mmol/L (ref 3.5–5.1)
SODIUM: 139 mmol/L (ref 135–145)
Total Bilirubin: 1 mg/dL (ref 0.3–1.2)
Total Protein: 7.4 g/dL (ref 6.5–8.1)

## 2015-07-06 LAB — CBC WITH DIFFERENTIAL/PLATELET
Basophils Absolute: 0 10*3/uL (ref 0.0–0.1)
Basophils Relative: 0 %
EOS ABS: 0.1 10*3/uL (ref 0.0–0.7)
EOS PCT: 1 %
HCT: 38.6 % — ABNORMAL LOW (ref 39.0–52.0)
Hemoglobin: 13.3 g/dL (ref 13.0–17.0)
LYMPHS ABS: 1.7 10*3/uL (ref 0.7–4.0)
Lymphocytes Relative: 17 %
MCH: 31.5 pg (ref 26.0–34.0)
MCHC: 34.5 g/dL (ref 30.0–36.0)
MCV: 91.5 fL (ref 78.0–100.0)
Monocytes Absolute: 0.7 10*3/uL (ref 0.1–1.0)
Monocytes Relative: 6 %
Neutro Abs: 7.7 10*3/uL (ref 1.7–7.7)
Neutrophils Relative %: 76 %
PLATELETS: 301 10*3/uL (ref 150–400)
RBC: 4.22 MIL/uL (ref 4.22–5.81)
RDW: 13.7 % (ref 11.5–15.5)
WBC: 10.1 10*3/uL (ref 4.0–10.5)

## 2015-07-06 LAB — GC/CHLAMYDIA PROBE AMP (~~LOC~~) NOT AT ARMC
Chlamydia: NEGATIVE
NEISSERIA GONORRHEA: NEGATIVE

## 2015-07-06 MED ORDER — LORAZEPAM 1 MG PO TABS
1.0000 mg | ORAL_TABLET | Freq: Three times a day (TID) | ORAL | Status: DC | PRN
  Filled 2015-07-06: qty 1

## 2015-07-06 MED ORDER — ZOLPIDEM TARTRATE 5 MG PO TABS
5.0000 mg | ORAL_TABLET | Freq: Every evening | ORAL | Status: DC | PRN
  Administered 2015-07-06: 5 mg via ORAL
  Filled 2015-07-06: qty 1

## 2015-07-06 MED ORDER — ONDANSETRON HCL 4 MG PO TABS: 4.0000 mg | ORAL_TABLET | Freq: Three times a day (TID) | ORAL | Status: DC | PRN

## 2015-07-06 MED ORDER — ACETAMINOPHEN 325 MG PO TABS: 650.0000 mg | ORAL_TABLET | ORAL | Status: DC | PRN

## 2015-07-06 MED ORDER — ZIPRASIDONE MESYLATE 20 MG IM SOLR
20.0000 mg | Freq: Once | INTRAMUSCULAR | Status: AC | PRN
  Administered 2015-07-06: 20 mg via INTRAMUSCULAR
  Filled 2015-07-06: qty 20
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
## 2015-07-06 NOTE — ED Notes (Signed)
Pt brought to room 38. Pt is sleeping soundly at present.  15 minute checks and video monitoring continue.

## 2015-07-06 NOTE — ED Notes (Signed)
Patient denies SI, HI, AVH. Denies any feelings of anxiety or depression. States that sleep has been decreased recently r/t getting up earlier than usual. Reports a mild headache which patient feels is due to wanting to go home. Answers delayed, pressured at times.   Encouragement offered. Declined Tylenol, requests Ambien to get back to sleep. Snacks provided.   Q 15 safety checks continue.

## 2015-07-06 NOTE — ED Notes (Addendum)
Pt's fiancee (Raymond Church) reported Nanavati MD that the Pt is acting "normal."  Sts when the Pt is speaking to "God," he is referring to a child that she miscarried several months ago.  Raymond Church was allowed in the Pt's room under the agreement that she try to calm the Pt down.  Pt overheard repeatedly telling fiancee that she better "back up."  Due to safety reasons, fiancee asked to leave the room.  Pt informed that if he can calm down for an extended amount of time, then we would consider him having another visitor.  Verbalized understanding and started yelling "look, I'm calm."    Pt's father, Raymond Church [phone], called to speak w/ a provider about the Pt's condition.  Pt refused to give permission to this or any provider to speak w/ his father.  Father informed that the Pt is here and the refusal.  Father verbalized understanding.
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
## 2015-07-06 NOTE — Progress Notes (Signed)
Pt not a good historian at this time to assess for pcp needs Cm intervention pended

## 2015-07-06 NOTE — ED Notes (Signed)
TTS consult has been canceled until pt arousal. Pt is sedated due to Geodon.

## 2015-07-06 NOTE — BH Assessment (Signed)
[facility]   Clinician went to patient's room to try to do assessment.  Patient asleep at this time.

## 2015-07-07 ENCOUNTER — Inpatient Hospital Stay
Admission: EM | Admit: 2015-07-07 | Payer: Federal, State, Local not specified - Other | Source: Intra-hospital | Admitting: Psychiatry

## 2015-07-07 LAB — URINALYSIS, ROUTINE W REFLEX MICROSCOPIC
BILIRUBIN URINE: NEGATIVE
GLUCOSE, UA: NEGATIVE mg/dL
HGB URINE DIPSTICK: NEGATIVE
KETONES UR: NEGATIVE mg/dL
Leukocytes, UA: NEGATIVE
Nitrite: NEGATIVE
PH: 6 (ref 5.0–8.0)
PROTEIN: NEGATIVE mg/dL
Specific Gravity, Urine: 1.031 — ABNORMAL HIGH (ref 1.005–1.030)

## 2015-07-07 LAB — RAPID URINE DRUG SCREEN, HOSP PERFORMED
Amphetamines: NOT DETECTED
BARBITURATES: NOT DETECTED
Benzodiazepines: NOT DETECTED
Cocaine: NOT DETECTED
Opiates: NOT DETECTED
Tetrahydrocannabinol: POSITIVE — AB

## 2015-07-07 MED ORDER — PRAZOSIN HCL 1 MG PO CAPS
1.0000 mg | ORAL_CAPSULE | Freq: Every day | ORAL | Status: DC
  Filled 2015-07-07: qty 1

## 2015-07-07 MED ORDER — BENZTROPINE MESYLATE 1 MG PO TABS
1.0000 mg | ORAL_TABLET | Freq: Two times a day (BID) | ORAL | Status: DC
  Administered 2015-07-07: 1 mg via ORAL
  Filled 2015-07-07: qty 1

## 2015-07-07 MED ORDER — HYDROXYZINE HCL 25 MG PO TABS: 25.0000 mg | ORAL_TABLET | Freq: Four times a day (QID) | ORAL | Status: DC | PRN

## 2015-07-07 MED ORDER — TRAZODONE HCL 50 MG PO TABS: 50.0000 mg | ORAL_TABLET | Freq: Every evening | ORAL | Status: DC | PRN

## 2015-07-07 MED ORDER — DIVALPROEX SODIUM ER 500 MG PO TB24
750.0000 mg | ORAL_TABLET | Freq: Every evening | ORAL | Status: DC
  Administered 2015-07-07: 750 mg via ORAL
  Filled 2015-07-07: qty 1

## 2015-07-07 MED ORDER — HALOPERIDOL 5 MG PO TABS
5.0000 mg | ORAL_TABLET | Freq: Two times a day (BID) | ORAL | Status: DC
  Administered 2015-07-07: 5 mg via ORAL
  Filled 2015-07-07: qty 1
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
## 2015-07-07 NOTE — ED Notes (Signed)
Pt is calm and cooperative today.  He denies SI, HI, and AVH.  He is alert and oriented.  15 minute checks and video monitoring continue.

## 2015-07-07 NOTE — ED Notes (Signed)
Previous ciwa charted on wrong person

## 2015-07-07 NOTE — BH Assessment (Signed)
[facility] Assessment Progress Note  Per Thedore MinsMojeed Akintayo, MD, this pt requires psychiatric hospitalization at this time. Berneice Heinrichina Tate, RN, [facility] that [facility] been [facility] to [facility] by Dr Jennet MaduroPucilowska to Rm 320. Dahlia ByesJosephine Onuoha, NP, concurs with this decision. Pt is under IVC, and IVC documents have been faxed to [phone]. Pt's nurse, Kendal Hymendie, has been notified, and agrees to call report to [phone]. [place] [facility] [facility]    Doylene Canninghomas Amal Saiki, MA  Triage Specialist  [phone]

## 2015-07-07 NOTE — Procedures (Signed)
PRN Ativan pulled for patient but not given. When attempting to return medication, pyxis said medication did not match, actual pill is 0.5mg  Ativan and not 1mg  that is in pyxis. 0.5 mg to be given to pharmacy.

## 2015-07-07 NOTE — ED Notes (Signed)
Pt was instructed the importance of giving a urine sample.  Pt. Agreed to give sample.

## 2015-07-07 NOTE — Progress Notes (Signed)
Entered in d/c instructions  Please use the resources provided to you in emergency room by case manager to assist with doctor for follow up These [place] uninsured resources provide possible primary care providers, resources for discounted medications, housing, dental resources, affordable care act information, plus other resources for [place]   

## 2015-07-07 NOTE — ED Notes (Signed)
Occasionally today patient has mentioned things that appear very delusional.  He believes his name is something other than what is on his paperwork.  He believes he works for the [employer] for 5000 dollars a day.  He is compliant with his meds but very irritable about having to take them.

## 2015-07-07 NOTE — ED Notes (Signed)
Sheriffs at bedside to transport [facility].

## 2015-07-07 NOTE — Progress Notes (Signed)
CM spoke with pt who confirms uninsured [place] resident with no pcp.  CM discussed and provided written information for uninsured accepting pcps, discussed the importance of pcp vs EDP services for f/u care, www.needymeds.org, www.goodrx.com, discounted pharmacies and other [place] resources such as [facility] , [facility], affordable care act, financial assistance, uninsured dental services, [facility] med assist, DSS and  health department  Reviewed resources for [place] uninsured accepting pcps like [facility], family medicine [facility] [address], [facility], [facility], local urgent care centers, [facility], [facility], general medical clinics, [facility], [facility] plus others, medication resources, [facility] out patient pharmacies and housing Pt voiced understanding and appreciation of resources provided   Provided [facility] contact information  

## 2015-12-13 ENCOUNTER — Emergency Department (HOSPITAL_COMMUNITY)
Admission: EM | Admit: 2015-12-13 | Discharge: 2015-12-13 | Disposition: A | Discharge status: Other Acute Inpatient Hospital | Payer: Self-pay | Attending: Emergency Medicine | Admitting: Emergency Medicine

## 2015-12-13 ENCOUNTER — Encounter (HOSPITAL_COMMUNITY): Payer: Self-pay

## 2015-12-13 ENCOUNTER — Observation Stay (HOSPITAL_COMMUNITY)
Admission: AD | Admit: 2015-12-13 | Discharge: 2015-12-15 | Disposition: A | Discharge status: Home / Self Care | Payer: Federal, State, Local not specified - Other | Source: Intra-hospital | Attending: Psychiatry | Admitting: Psychiatry

## 2015-12-13 ENCOUNTER — Encounter (HOSPITAL_COMMUNITY): Payer: Self-pay | Admitting: *Deleted

## 2015-12-13 ENCOUNTER — Emergency Department (HOSPITAL_COMMUNITY): Payer: Self-pay

## 2015-12-13 DIAGNOSIS — F3130 Bipolar disorder, current episode depressed, mild or moderate severity, unspecified: Principal | ICD-10-CM | POA: Insufficient documentation

## 2015-12-13 DIAGNOSIS — F32A Depression, unspecified: Secondary | ICD-10-CM

## 2015-12-13 DIAGNOSIS — F329 Major depressive disorder, single episode, unspecified: Secondary | ICD-10-CM | POA: Insufficient documentation

## 2015-12-13 DIAGNOSIS — R51 Headache: Secondary | ICD-10-CM | POA: Insufficient documentation

## 2015-12-13 DIAGNOSIS — F1910 Other psychoactive substance abuse, uncomplicated: Secondary | ICD-10-CM | POA: Insufficient documentation

## 2015-12-13 DIAGNOSIS — F319 Bipolar disorder, unspecified: Secondary | ICD-10-CM | POA: Diagnosis present

## 2015-12-13 DIAGNOSIS — Z79899 Other long term (current) drug therapy: Secondary | ICD-10-CM | POA: Insufficient documentation

## 2015-12-13 DIAGNOSIS — F4310 Post-traumatic stress disorder, unspecified: Secondary | ICD-10-CM | POA: Insufficient documentation

## 2015-12-13 DIAGNOSIS — J45909 Unspecified asthma, uncomplicated: Secondary | ICD-10-CM | POA: Insufficient documentation

## 2015-12-13 DIAGNOSIS — F17210 Nicotine dependence, cigarettes, uncomplicated: Secondary | ICD-10-CM | POA: Insufficient documentation

## 2015-12-13 DIAGNOSIS — R45851 Suicidal ideations: Secondary | ICD-10-CM

## 2015-12-13 HISTORY — DX: Bipolar disorder, unspecified: F31.9

## 2015-12-13 LAB — COMPREHENSIVE METABOLIC PANEL
ALBUMIN: 3.9 g/dL (ref 3.5–5.0)
ALT: 15 U/L — ABNORMAL LOW (ref 17–63)
ANION GAP: 4 — AB (ref 5–15)
AST: 21 U/L (ref 15–41)
Alkaline Phosphatase: 33 U/L — ABNORMAL LOW (ref 38–126)
BILIRUBIN TOTAL: 0.4 mg/dL (ref 0.3–1.2)
BUN: 6 mg/dL (ref 6–20)
CHLORIDE: 104 mmol/L (ref 101–111)
CO2: 29 mmol/L (ref 22–32)
Calcium: 9 mg/dL (ref 8.9–10.3)
Creatinine, Ser: 0.89 mg/dL (ref 0.61–1.24)
GFR calc Af Amer: >60 mL/min (ref >60)
GLUCOSE: 91 mg/dL (ref 65–99)
POTASSIUM: 4.1 mmol/L (ref 3.5–5.1)
Sodium: 137 mmol/L (ref 135–145)
TOTAL PROTEIN: 6.5 g/dL (ref 6.5–8.1)

## 2015-12-13 LAB — CBC
HEMATOCRIT: 39.6 % (ref 39.0–52.0)
HEMOGLOBIN: 12.9 g/dL — AB (ref 13.0–17.0)
MCH: 31 pg (ref 26.0–34.0)
MCHC: 32.6 g/dL (ref 30.0–36.0)
MCV: 95.2 fL (ref 78.0–100.0)
Platelets: 302 10*3/uL (ref 150–400)
RBC: 4.16 MIL/uL — AB (ref 4.22–5.81)
RDW: 14.5 % (ref 11.5–15.5)
WBC: 8.4 10*3/uL (ref 4.0–10.5)

## 2015-12-13 LAB — RAPID URINE DRUG SCREEN, HOSP PERFORMED
Amphetamines: NOT DETECTED
BARBITURATES: NOT DETECTED
BENZODIAZEPINES: NOT DETECTED
COCAINE: POSITIVE — AB
OPIATES: NOT DETECTED
TETRAHYDROCANNABINOL: NOT DETECTED

## 2015-12-13 LAB — ACETAMINOPHEN LEVEL

## 2015-12-13 LAB — ETHANOL: Alcohol, Ethyl (B): <5 mg/dL (ref <5)

## 2015-12-13 LAB — SALICYLATE LEVEL

## 2015-12-13 MED ORDER — ONDANSETRON HCL 4 MG PO TABS: 4.0000 mg | ORAL_TABLET | Freq: Three times a day (TID) | ORAL | Status: DC | PRN

## 2015-12-13 MED ORDER — ALUM & MAG HYDROXIDE-SIMETH 200-200-20 MG/5ML PO SUSP: 30.0000 mL | ORAL | Status: DC | PRN

## 2015-12-13 MED ORDER — IBUPROFEN 600 MG PO TABS
600.0000 mg | ORAL_TABLET | Freq: Three times a day (TID) | ORAL | Status: DC | PRN
  Administered 2015-12-13 – 2015-12-14 (×2): 600 mg via ORAL
  Filled 2015-12-13 (×2): qty 1
  Filled 2015-12-13: qty 10

## 2015-12-13 MED ORDER — BENZTROPINE MESYLATE 0.5 MG PO TABS: 0.5000 mg | ORAL_TABLET | Freq: Every day | ORAL | Status: DC

## 2015-12-13 MED ORDER — LORAZEPAM 1 MG PO TABS: 1.0000 mg | ORAL_TABLET | Freq: Three times a day (TID) | ORAL | Status: DC | PRN

## 2015-12-13 MED ORDER — LORAZEPAM 1 MG PO TABS
1.0000 mg | ORAL_TABLET | Freq: Three times a day (TID) | ORAL | Status: DC | PRN
  Administered 2015-12-14: 1 mg via ORAL
  Filled 2015-12-13: qty 1

## 2015-12-13 MED ORDER — NICOTINE 21 MG/24HR TD PT24
21.0000 mg | MEDICATED_PATCH | Freq: Every day | TRANSDERMAL | Status: DC
  Administered 2015-12-13: 21 mg via TRANSDERMAL
  Filled 2015-12-13: qty 1

## 2015-12-13 MED ORDER — ACETAMINOPHEN 325 MG PO TABS: 650.0000 mg | ORAL_TABLET | ORAL | Status: DC | PRN

## 2015-12-13 MED ORDER — MAGNESIUM HYDROXIDE 400 MG/5ML PO SUSP: 30.0000 mL | Freq: Every day | ORAL | Status: DC | PRN

## 2015-12-13 MED ORDER — BENZTROPINE MESYLATE 1 MG PO TABS
1.0000 mg | ORAL_TABLET | Freq: Every day | ORAL | Status: DC
  Administered 2015-12-13: 1 mg via ORAL
  Filled 2015-12-13: qty 1

## 2015-12-13 MED ORDER — ZOLPIDEM TARTRATE 5 MG PO TABS
5.0000 mg | ORAL_TABLET | Freq: Every evening | ORAL | Status: DC | PRN
  Administered 2015-12-13: 5 mg via ORAL
  Filled 2015-12-13: qty 1

## 2015-12-13 MED ORDER — ACETAMINOPHEN 325 MG PO TABS
650.0000 mg | ORAL_TABLET | Freq: Four times a day (QID) | ORAL | Status: DC | PRN
  Administered 2015-12-15: 650 mg via ORAL
  Filled 2015-12-13: qty 2

## 2015-12-13 MED ORDER — IBUPROFEN 400 MG PO TABS
600.0000 mg | ORAL_TABLET | Freq: Three times a day (TID) | ORAL | Status: DC | PRN
  Administered 2015-12-13: 600 mg via ORAL
  Filled 2015-12-13: qty 1

## 2015-12-13 MED ORDER — ZOLPIDEM TARTRATE 5 MG PO TABS: 5.0000 mg | ORAL_TABLET | Freq: Every evening | ORAL | Status: DC | PRN

## 2015-12-13 MED ORDER — NICOTINE 21 MG/24HR TD PT24
21.0000 mg | MEDICATED_PATCH | Freq: Every day | TRANSDERMAL | Status: DC
  Administered 2015-12-14 – 2015-12-15 (×2): 21 mg via TRANSDERMAL
  Filled 2015-12-13: qty 1

## 2015-12-13 MED ORDER — HALOPERIDOL 5 MG PO TABS
5.0000 mg | ORAL_TABLET | Freq: Every day | ORAL | Status: DC
  Administered 2015-12-13: 5 mg via ORAL
  Filled 2015-12-13: qty 1

## 2015-12-13 NOTE — ED Notes (Signed)
Lunch ordered 

## 2015-12-13 NOTE — ED Notes (Signed)
All belongings sent with [facility].

## 2015-12-13 NOTE — ED Notes (Signed)
Ordered house tray 

## 2015-12-13 NOTE — ED Notes (Signed)
Pt on TTS  

## 2015-12-13 NOTE — ED Notes (Signed)
Called Security to have PT wanded.Raymond Church..Raymond Church

## 2015-12-13 NOTE — [facility] Counselor (Signed)
Placement -- Pt accepted [facility] Bed [facility]; may be admitted after 1500.

## 2015-12-13 NOTE — ED Triage Notes (Signed)
Patient here complaining of not feeling right, not thinking right and states I want to hurt myself. Has hx of depression and states that he has not had any meds for same in a long time. Reports that he used cocaine and marijuana 2 days ago. Not making eye contact but cooperative

## 2015-12-13 NOTE — ED Notes (Signed)
Pt stating headache.

## 2015-12-13 NOTE — ED Provider Notes (Signed)
Patient alert, content, nad.    [facility]  Vitals:   12/13/15 1117 12/13/15 1800  BP: (!) 100/47 109/71  Pulse: 97 98  Resp: 18 18  Temp: 98.8 F (37.1 C) 98.3 F (36.8 C)      Cathren LaineKevin Alessandro Griep, MD 12/13/15 1827

## 2015-12-13 NOTE — ED Notes (Signed)
Dinner tray ordered; regular diet; no sharps

## 2015-12-13 NOTE — ED Provider Notes (Signed)
[facility]-EMERGENCY DEPT Provider Note   CSN: [id number] Arrival date & time: 12/13/15  1478  First Provider Contact:  First MD Initiated Contact with Patient 12/13/15 [phone]        History   Chief Complaint Chief Complaint  Patient presents with  . Suicidal    HPI   Raymond Church is a 25 y.o. male complaining of "not thinking right." He states that he has been struggling, he feels alone, he was on psychiatric medication 2 years ago stopped taking it because he ran out, he hasn't followed with any cancer psychiatrist in the meantime. He has had difficulty sleeping, to me he denies any suicidal ideation or homicidal ideation, he endorses drug use without alcohol use. He uses cocaine and smokes marijuana. He states he feels that his mind is right and he says that he has a drinking in his head and he asks if I can see the ringing on his temples. He describes it as a pulsation as well. He states it's been there for 2 weeks. He states that it is alleviated with ibuprofen. States he doesn't typically have headaches. No change in his vision nausea vomiting, dysarthria ataxia.  HPI  Past Medical History:  Diagnosis Date  . Asthma   . Depression     Patient Active Problem List   Diagnosis Date Noted  . Bipolar 1 disorder, mixed, moderate (HCC) 09/08/2014  . Cocaine use disorder, severe, dependence (HCC) 09/08/2014  . Cannabis use disorder, severe, dependence (HCC) 09/08/2014    History reviewed. No pertinent surgical history.     Home Medications    Prior to Admission medications   Medication Sig Start Date End Date Taking? Authorizing Provider  benztropine (COGENTIN) 1 MG tablet Take 1 tablet (1 mg total) by mouth 2 (two) times daily. For drug-induced extrapyramidal reaction Patient not taking: Reported on 07/06/2015 09/10/14   Shuvon B Rankin, NP  divalproex (DEPAKOTE ER) 250 MG 24 hr tablet Take 3 tablets (750 mg total) by mouth every evening. For mood control Patient not taking:  Reported on 07/06/2015 09/10/14   Shuvon B Rankin, NP  haloperidol (HALDOL) 5 MG tablet Take 5 mg (one tablet) every morning and take 10 mg (two tablet) every night at bedtime for psychosis Patient not taking: Reported on 07/06/2015 09/10/14   Shuvon B Rankin, NP  hydrOXYzine (ATARAX/VISTARIL) 25 MG tablet Take 1 tablet (25 mg total) by mouth every 6 (six) hours as needed (anxiety/agitation). Patient not taking: Reported on 07/06/2015 09/10/14   Shuvon B Rankin, NP  ibuprofen (ADVIL,MOTRIN) 200 MG tablet Take 400 mg by mouth every 6 (six) hours as needed (pain). Reported on 07/06/2015    Historical Provider, MD  Multiple Vitamin (MULTIVITAMIN WITH MINERALS) TABS tablet Take 1 tablet by mouth daily. For nutritional support Patient not taking: Reported on 07/06/2015 09/10/14   Shuvon B Rankin, NP  nicotine (NICODERM CQ - DOSED IN MG/24 HOURS) 21 mg/24hr patch Place 1 patch (21 mg total) onto the skin daily. Patient not taking: Reported on 07/06/2015 09/10/14   Shuvon B Rankin, NP  prazosin (MINIPRESS) 1 MG capsule Take 1 capsule (1 mg total) by mouth at bedtime. For PTSD syndrome Patient not taking: Reported on 07/06/2015 09/10/14   Shuvon B Rankin, NP  traZODone (DESYREL) 50 MG tablet Take 1 tablet (50 mg total) by mouth at bedtime and may repeat dose one time if needed. For sleep Patient not taking: Reported on 07/06/2015 09/10/14   Talmage Nap, NP  Family History Family History  Problem Relation Age of Onset  . Bipolar disorder Mother   . Drug abuse Mother     Social History Social History  Substance Use Topics  . Smoking status: Current Every Day Smoker    Packs/day: 0.25    Years: 10.00    Types: Cigarettes  . Smokeless tobacco: Never Used  . Alcohol use Yes     Comment: occasional      Allergies   Shellfish allergy; Amoxicillin; Mushroom extract complex; and Penicillins   Review of Systems Review of Systems  10 systems reviewed and found to be negative, except as noted in the  HPI.   Physical Exam Updated Vital Signs BP (!) 100/47 (BP Location: Right Arm)   Pulse 97   Temp 98.8 F (37.1 C) (Oral)   Resp 18   SpO2 100%   Physical Exam  Constitutional: He is oriented to person, place, and time. He appears well-developed and well-nourished. No distress.  HENT:  Head: Normocephalic and atraumatic.  Mouth/Throat: Oropharynx is clear and moist.  Eyes: Conjunctivae and EOM are normal. Pupils are equal, round, and reactive to light.  Neck: Normal range of motion.  Cardiovascular: Normal rate, regular rhythm and intact distal pulses.   Pulmonary/Chest: Effort normal and breath sounds normal.  Abdominal: Soft. There is no tenderness.  Musculoskeletal: Normal range of motion.  Neurological: He is alert and oriented to person, place, and time.  Skin: He is not diaphoretic.  Psychiatric: His affect is blunt. His speech is delayed. He is slowed and withdrawn. He exhibits a depressed mood. He expresses no homicidal and no suicidal ideation. He is noncommunicative.  Poor eye contact  Nursing note and vitals reviewed.    ED Treatments / Results  Labs (all labs ordered are listed, but only abnormal results are displayed) Labs Reviewed  COMPREHENSIVE METABOLIC PANEL - Abnormal; Notable for the following:       Result Value   ALT 15 (*)    Alkaline Phosphatase 33 (*)    Anion gap 4 (*)    All other components within normal limits  ACETAMINOPHEN LEVEL - Abnormal; Notable for the following:    Acetaminophen (Tylenol), Serum <10 (*)    All other components within normal limits  CBC - Abnormal; Notable for the following:    RBC 4.16 (*)    Hemoglobin 12.9 (*)    All other components within normal limits  URINE RAPID DRUG SCREEN, HOSP PERFORMED - Abnormal; Notable for the following:    Cocaine POSITIVE (*)    All other components within normal limits  ETHANOL  SALICYLATE LEVEL    EKG  EKG Interpretation None       Radiology Ct Head Wo  Contrast  Result Date: 12/13/2015 CLINICAL DATA:  Headache off and on for 2 months, history asthma, smoking EXAM: CT HEAD WITHOUT CONTRAST TECHNIQUE: Contiguous axial images were obtained from the base of the skull through the vertex without intravenous contrast. COMPARISON:  09/07/2014 FINDINGS: Normal ventricular morphology. No midline shift or mass effect. Normal appearance of brain parenchyma. No intracranial hemorrhage, mass lesion or evidence acute infarction. No extra-axial fluid collections. Paranasal sinuses and mastoid air cells clear. Bones unremarkable. IMPRESSION: Normal exam. Electronically Signed   By: Ulyses Southward M.D.   On: 12/13/2015 11:05    Procedures Procedures (including critical care time)  Medications Ordered in ED Medications  alum & mag hydroxide-simeth (MAALOX/MYLANTA) 200-200-20 MG/5ML suspension 30 mL (not administered)  ondansetron (ZOFRAN) tablet 4  mg (not administered)  nicotine (NICODERM CQ - dosed in mg/24 hours) patch 21 mg (not administered)  zolpidem (AMBIEN) tablet 5 mg (not administered)  ibuprofen (ADVIL,MOTRIN) tablet 600 mg (not administered)  acetaminophen (TYLENOL) tablet 650 mg (not administered)  LORazepam (ATIVAN) tablet 1 mg (not administered)     Initial Impression / Assessment and Plan / ED Course  I have reviewed the triage vital signs and the nursing notes.  Pertinent labs & imaging results that were available during my care of the patient were reviewed by me and considered in my medical decision making (see chart for details).  Clinical Course    Vitals:   12/13/15 1117  BP: (!) 100/47  Pulse: 97  Resp: 18  Temp: 98.8 F (37.1 C)  TempSrc: Oral  SpO2: 100%    Medications  alum & mag hydroxide-simeth (MAALOX/MYLANTA) 200-200-20 MG/5ML suspension 30 mL (not administered)  ondansetron (ZOFRAN) tablet 4 mg (not administered)  nicotine (NICODERM CQ - dosed in mg/24 hours) patch 21 mg (not administered)  zolpidem (AMBIEN) tablet  5 mg (not administered)  ibuprofen (ADVIL,MOTRIN) tablet 600 mg (not administered)  acetaminophen (TYLENOL) tablet 650 mg (not administered)  LORazepam (ATIVAN) tablet 1 mg (not administered)    Ociel Ephriam KnucklesW Kinkaid is 25 y.o. male presenting with depression, insomnia. Patient denies suicidal ideation to me however he did admit to the triage nurse that he wanted to hurt himself. Patient is describing what I interpret to be a headache. States he's had it for over 2 weeks, his neurologic exam is nonfocal however I would like to obtain a CT given the length of his symptoms, think this is probably just secondary to lack of sleep.  Patient is medically cleared for psychiatric evaluation will be transferred to the psych ED. TTS consulted, home meds and psych standard holding orders placed.        Final Clinical Impressions(s) / ED Diagnoses   Final diagnoses:  None    New Prescriptions New Prescriptions   No medications on file     Wynetta Emeryicole Tene Gato, PA-C 12/13/15 1201    Pricilla LovelessScott Goldston, MD 12/19/15 [phone]
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
## 2015-12-14 DIAGNOSIS — F319 Bipolar disorder, unspecified: Secondary | ICD-10-CM

## 2015-12-14 MED ORDER — HALOPERIDOL 5 MG PO TABS
5.0000 mg | ORAL_TABLET | Freq: Two times a day (BID) | ORAL | Status: DC
  Administered 2015-12-14 – 2015-12-15 (×2): 5 mg via ORAL
  Filled 2015-12-14 (×2): qty 1
  Filled 2015-12-14: qty 10

## 2015-12-14 MED ORDER — PRAZOSIN HCL 1 MG PO CAPS
1.0000 mg | ORAL_CAPSULE | Freq: Every day | ORAL | Status: DC
  Administered 2015-12-14: 1 mg via ORAL
  Filled 2015-12-14: qty 1

## 2015-12-14 MED ORDER — BENZTROPINE MESYLATE 0.5 MG PO TABS
0.5000 mg | ORAL_TABLET | Freq: Two times a day (BID) | ORAL | Status: DC
  Administered 2015-12-14 – 2015-12-15 (×2): 0.5 mg via ORAL
  Filled 2015-12-14: qty 1
  Filled 2015-12-14: qty 10
  Filled 2015-12-14: qty 1
  Filled 2015-12-14: qty 10

## 2015-12-14 NOTE — Progress Notes (Signed)
Admission Note:  D: Patient is a 25 year old male who [facility] of "not feeling right in the head". On admission, patient appear disheveled and unkempt. Sad mood and depressed affect. Patient verbalized concern of not feeling good in his head. Patient stated "I have this strange feelings in my head. I can't describe it but I know all is not well.  It feels like headache but its more like that. I try controlling it but I can't. Sometimes, I feel my head is bursting from bad thoughts". Patient denies SI/HI stated "I cant hurt myself or someone". Endorses mild depression and anxiety from the "troubles in the his head'. Patient reports AH/VH.  A: Skin was assessed, no contraband found. Tattoos noted all over the body. No wounds/bruises noted. POC and unit policies explained and understanding verbalized. Consents obtained. Accepted food and fluids offered..  R: Patient had no additional questions or concerns.

## 2015-12-14 NOTE — [facility] Counselor (Signed)
Pt would like to go to a residential treatment facility to assist with his substance abuse issues and he wants to get back on his medications for his mental health disorder. Pt needs assistance in various areas in his life. OBS counselor mentioned several Residential facilities to this patient ([facility], [facility], and [facility]). AM Counselor will need to check bed availability at the above mentioned facilities in an ongoing effort to meet the needs of this pt.

## 2015-12-14 NOTE — Progress Notes (Signed)
[facility], [facility],and [facility] called and no beds are available at this time.

## 2015-12-14 NOTE — Progress Notes (Signed)
SBAR report given to [employer] RN.

## 2015-12-14 NOTE — Progress Notes (Signed)
D:Pt reports feeling sleepy this morning with not feeling right in his head. Pt says that he stays with his [name] Mickie Bailosha and plans to go back with her after discharge. Pt ate breakfast this morning and fell back asleep. His respirations are even and labored.A:Offered support, encouragement and observation. R:Pt denies si and hi thoughts. He denies auditory and visual hallucinations. Safety maintained in the observations unit.

## 2015-12-14 NOTE — Progress Notes (Signed)
Patient is recliner sleeping and has been sleeping per nurse report.

## 2015-12-14 NOTE — Progress Notes (Signed)
Patient has been in recliner sleeping. He is withdrawn, depressed, but cooperative. Patient denies SI, HI, and AVH. Patient denies pain.   Patient remains safe through constant monitoring with exception of bathroom times. Patient offered education, encouragement, and support. Medication administered.   Patient is receptive and compliant, will continue to monitor.

## 2015-12-14 NOTE — H&P (Signed)
[facility] Provider Admission PAA/H&P  Patient Identification: Raymond Church MRN:  [id number] Date of Evaluation:  12/14/2015 Chief Complaint:  Patient states "I feel depressed like I just want to give up because I'm not sure what is wrong with my mind."  Principal Diagnosis: Bipolar 1 disorder, depressed ([place]) Diagnosis:   Patient Active Problem List   Diagnosis Date Noted  . Bipolar 1 disorder, depressed ([place]) [F31.9] 12/13/2015  . Bipolar 1 disorder, mixed, moderate ([place]) [F31.62] 09/08/2014  . Cocaine use disorder, severe, dependence ([place]) [F14.20] 09/08/2014  . Cannabis use disorder, severe, dependence ([place]) [F12.20] 09/08/2014   History of Present Illness:   Raymond Church is a 25 y.o. male who presented voluntarily to [facility] with complaint of "not feeling right." He has a history of Bipolar Disorder and cocaine use.   He has been treated inpatient  in 2016 and 2015 as well for suicidal and homicidal ideation. The patient has experienced difficulty expressing his symptoms making comments such as "My head does not feel right. I feel depressed like giving up." Patient stated that he is experiencing poor sleep, poor appetite, tearfulness, and persistent despondency.  He also endorsed episodic use of cocaine, with last use being on 12/11/15.  Patient has a significant history of substance use including cocaine, marijuana, molly, benzos, and alcohol. His UDS was positive for cocaine only with negative blood alcohol level. He reports infrequent use of cocaine.  In addition to emotional health and substance use concerns, patient endorses problems with stable housing. At this time patient reports will be able to live with a family member after discharge. He is also reporting vague psychotic symptoms such as "hearing clicks and seeing black spots that look like birds." Patient has been treated with Minipress before to address trauma related to loss of his mother who died in front of him. In the  past he was referred to [facility] and [facility] for outpatient follow up. He reports going to [facility] "one time" and is unable to provide a rationale regarding why he did not return for further appointments.   Associated Signs/Symptoms: Depression Symptoms:  depressed mood, anhedonia, psychomotor retardation, feelings of worthlessness/guilt, difficulty concentrating, impaired memory, (Hypo) Manic Symptoms:  Hallucinations, Labiality of Mood, Anxiety Symptoms:  Denies Psychotic Symptoms:  Hallucinations: Auditory Visual PTSD Symptoms: Had a traumatic exposure:  Reports witnessing his mother die when he was younger.  Total Time spent with patient: 30 minutes  Past Psychiatric History: Bipolar Disorder, Cocaine abuse   Is the patient at risk to self? No.  Has the patient been a risk to self in the past 6 months? No.  Has the patient been a risk to self within the distant past? No.  Is the patient a risk to others? No.  Has the patient been a risk to others in the past 6 months? No.  Has the patient been a risk to others within the distant past? No.   Prior Inpatient Therapy:  Yes at [facility] Prior Outpatient Therapy:  Referred to [facility] but may not have followed up   Alcohol Screening:   Substance Abuse History in the last 12 months:  Yes.   Consequences of Substance Abuse: Worsening of Bipolar Disorder symptoms Previous Psychotropic Medications: Yes  Psychological Evaluations: No  Past Medical History:  Past Medical History:  Diagnosis Date  . Asthma   . Bipolar disorder ([place])   . Depression    History reviewed. No pertinent surgical history. Family History:  Family History  Problem Relation Age of Onset  . Bipolar disorder Mother   . Drug abuse Mother    Tobacco Screening:   Social History:  History  Alcohol Use  . Yes    Comment: occasional      History  Drug Use  . Frequency: 5.0 times per week  . Types: Cocaine, Marijuana, MDMA (Ecstacy)    Comment: Pt endorsed  current use of cocaine, THC 12/13/15    Additional Social History:                           Allergies:   Allergies  Allergen Reactions  . Shellfish Allergy Anaphylaxis  . Amoxicillin Other (See Comments)    thrush  . Mushroom Extract Complex Nausea And Vomiting  . Penicillins Other (See Comments)    thrush   Lab Results:  Results for orders placed or performed during the hospital encounter of 12/13/15 (from the past 48 hour(s))  Rapid urine drug screen (hospital performed)     Status: Abnormal   Collection Time: 12/13/15  7:45 AM  Result Value Ref Range   Opiates NONE DETECTED NONE DETECTED   Cocaine POSITIVE (A) NONE DETECTED   Benzodiazepines NONE DETECTED NONE DETECTED   Amphetamines NONE DETECTED NONE DETECTED   Tetrahydrocannabinol NONE DETECTED NONE DETECTED   Barbiturates NONE DETECTED NONE DETECTED    Comment:        DRUG SCREEN FOR MEDICAL PURPOSES ONLY.  IF CONFIRMATION IS NEEDED FOR ANY PURPOSE, NOTIFY LAB WITHIN 5 DAYS.        LOWEST DETECTABLE LIMITS FOR URINE DRUG SCREEN Drug Class       Cutoff (ng/mL) Amphetamine      1000 Barbiturate      200 Benzodiazepine   016 Tricyclics       010 Opiates          300 Cocaine          300 THC              50   Comprehensive metabolic panel     Status: Abnormal   Collection Time: 12/13/15 10:08 AM  Result Value Ref Range   Sodium 137 135 - 145 mmol/L   Potassium 4.1 3.5 - 5.1 mmol/L   Chloride 104 101 - 111 mmol/L   CO2 29 22 - 32 mmol/L   Glucose, Bld 91 65 - 99 mg/dL   BUN 6 6 - 20 mg/dL   Creatinine, Ser 0.89 0.61 - 1.24 mg/dL   Calcium 9.0 8.9 - 10.3 mg/dL   Total Protein 6.5 6.5 - 8.1 g/dL   Albumin 3.9 3.5 - 5.0 g/dL   AST 21 15 - 41 U/L   ALT 15 (L) 17 - 63 U/L   Alkaline Phosphatase 33 (L) 38 - 126 U/L   Total Bilirubin 0.4 0.3 - 1.2 mg/dL   GFR calc non Af Amer >60 >60 mL/min   GFR calc Af Amer >60 >60 mL/min    Comment: (NOTE) The eGFR has been calculated using the CKD EPI  equation. This calculation has not been validated in all clinical situations. eGFR's persistently <60 mL/min signify possible Chronic Kidney Disease.    Anion gap 4 (L) 5 - 15  cbc     Status: Abnormal   Collection Time: 12/13/15 10:08 AM  Result Value Ref Range   WBC 8.4 4.0 - 10.5 K/uL   RBC 4.16 (L) 4.22 - 5.81 MIL/uL   Hemoglobin 12.9 (  L) 13.0 - 17.0 g/dL   HCT 39.6 39.0 - 52.0 %   MCV 95.2 78.0 - 100.0 fL   MCH 31.0 26.0 - 34.0 pg   MCHC 32.6 30.0 - 36.0 g/dL   RDW 14.5 11.5 - 15.5 %   Platelets 302 150 - 400 K/uL  Ethanol     Status: None   Collection Time: 12/13/15 10:09 AM  Result Value Ref Range   Alcohol, Ethyl (B) <5 <5 mg/dL    Comment:        LOWEST DETECTABLE LIMIT FOR SERUM ALCOHOL IS 5 mg/dL FOR MEDICAL PURPOSES ONLY   Salicylate level     Status: None   Collection Time: 12/13/15 10:09 AM  Result Value Ref Range   Salicylate Lvl <3.0 2.8 - 30.0 mg/dL  Acetaminophen level     Status: Abnormal   Collection Time: 12/13/15 10:09 AM  Result Value Ref Range   Acetaminophen (Tylenol), Serum <10 (L) 10 - 30 ug/mL    Comment:        THERAPEUTIC CONCENTRATIONS VARY SIGNIFICANTLY. A RANGE OF 10-30 ug/mL MAY BE AN EFFECTIVE CONCENTRATION FOR MANY PATIENTS. HOWEVER, SOME ARE BEST TREATED AT CONCENTRATIONS OUTSIDE THIS RANGE. ACETAMINOPHEN CONCENTRATIONS >150 ug/mL AT 4 HOURS AFTER INGESTION AND >50 ug/mL AT 12 HOURS AFTER INGESTION ARE OFTEN ASSOCIATED WITH TOXIC REACTIONS.     Blood Alcohol level:  Lab Results  Component Value Date   ETH <5 12/13/2015   ETH <5 16/05/930    Metabolic Disorder Labs:  No results found for: HGBA1C, MPG No results found for: PROLACTIN No results found for: CHOL, TRIG, HDL, CHOLHDL, VLDL, LDLCALC  Current Medications: Current Facility-Administered Medications  Medication Dose Route Frequency Provider Last Rate Last Dose  . acetaminophen (TYLENOL) tablet 650 mg  650 mg Oral Q6H PRN Niel Hummer, NP      . alum & mag  hydroxide-simeth (MAALOX/MYLANTA) 200-200-20 MG/5ML suspension 30 mL  30 mL Oral PRN Niel Hummer, NP      . benztropine (COGENTIN) tablet 0.5 mg  0.5 mg Oral BID Niel Hummer, NP      . haloperidol (HALDOL) tablet 5 mg  5 mg Oral BID Niel Hummer, NP      . ibuprofen (ADVIL,MOTRIN) tablet 600 mg  600 mg Oral Q8H PRN Niel Hummer, NP   600 mg at 12/14/15 1353  . LORazepam (ATIVAN) tablet 1 mg  1 mg Oral Q8H PRN Niel Hummer, NP      . magnesium hydroxide (MILK OF MAGNESIA) suspension 30 mL  30 mL Oral Daily PRN Niel Hummer, NP      . nicotine (NICODERM CQ - dosed in mg/24 hours) patch 21 mg  21 mg Transdermal Daily Niel Hummer, NP      . ondansetron [facility]) tablet 4 mg  4 mg Oral Q8H PRN Niel Hummer, NP      . prazosin (MINIPRESS) capsule 1 mg  1 mg Oral QHS Niel Hummer, NP       PTA Medications: Prescriptions Prior to Admission  Medication Sig Dispense Refill Last Dose  . benztropine (COGENTIN) 1 MG tablet Take 1 tablet (1 mg total) by mouth 2 (two) times daily. For drug-induced extrapyramidal reaction (Patient not taking: Reported on 07/06/2015) 60 tablet 0 Not Taking at Unknown time  . divalproex (DEPAKOTE ER) 250 MG 24 hr tablet Take 3 tablets (750 mg total) by mouth every evening. For mood control (Patient not taking: Reported on  07/06/2015) 90 tablet 0 Not Taking at Unknown time  . haloperidol (HALDOL) 5 MG tablet Take 5 mg (one tablet) every morning and take 10 mg (two tablet) every night at bedtime for psychosis (Patient not taking: Reported on 07/06/2015) 60 tablet 0 Not Taking at Unknown time  . hydrOXYzine (ATARAX/VISTARIL) 25 MG tablet Take 1 tablet (25 mg total) by mouth every 6 (six) hours as needed (anxiety/agitation). (Patient not taking: Reported on 07/06/2015) 30 tablet 0 Not Taking at Unknown time  . ibuprofen (ADVIL,MOTRIN) 200 MG tablet Take 1,000 mg by mouth every 6 (six) hours as needed (pain). Reported on 07/06/2015   12/12/2015 at Unknown time  . Multiple Vitamin  (MULTIVITAMIN WITH MINERALS) TABS tablet Take 1 tablet by mouth daily. For nutritional support (Patient not taking: Reported on 07/06/2015) 30 tablet 0 Not Taking at Unknown time  . nicotine (NICODERM CQ - DOSED IN MG/24 HOURS) 21 mg/24hr patch Place 1 patch (21 mg total) onto the skin daily. (Patient not taking: Reported on 07/06/2015) 28 patch 0 Not Taking at Unknown time  . prazosin (MINIPRESS) 1 MG capsule Take 1 capsule (1 mg total) by mouth at bedtime. For PTSD syndrome (Patient not taking: Reported on 07/06/2015) 30 capsule 0 Not Taking at Unknown time  . traZODone (DESYREL) 50 MG tablet Take 1 tablet (50 mg total) by mouth at bedtime and may repeat dose one time if needed. For sleep (Patient not taking: Reported on 07/06/2015) 60 tablet 0 Not Taking at Unknown time    Musculoskeletal: Strength & Muscle Tone: within normal limits Gait & Station: normal Patient leans: [name]  Psychiatric Specialty Exam: Physical Exam  Review of Systems  Psychiatric/Behavioral: Positive for depression, hallucinations, substance abuse and suicidal ideas. Negative for memory loss. The patient is not nervous/anxious and does not have insomnia.     Blood pressure 107/74, pulse 80, temperature 97.9 F (36.6 C), temperature source Oral, resp. rate 16, height 5' 7.99" (1.727 m), weight 63.5 kg (140 lb), SpO2 100 %.Body mass index is 21.29 kg/m.  General Appearance: Disheveled  Eye Contact:  Fair  Speech:  Clear and Coherent  Volume:  Normal  Mood:  Dysphoric  Affect:  Blunt  Thought Process:  Coherent  Orientation:  Full (Time, Place, and Person)  Thought Content:  Depressed mood, feels his thoughts are not clear, psychosis  Suicidal Thoughts:  Yes.  without intent/plan  Homicidal Thoughts:  No  Memory:  Immediate;   Good Recent;   Fair Remote;   Fair  Judgement:  Impaired  Insight:  Lacking  Psychomotor Activity:  Decreased  Concentration:  Concentration: Fair and Attention Span: Fair  Recall:  [employer]  of Knowledge:  Good  Language:  Good  Akathisia:  No  Handed:  Right  AIMS (if indicated):     Assets:  Communication Skills Desire for Improvement Housing Leisure Time Physical Health Resilience  ADL's:  Intact  Cognition:  WNL  Sleep:         Treatment Plan Summary: Daily contact with patient to assess and evaluate symptoms and progress in treatment and Medication management  Observation Level/Precautions:  Continuous Observation Laboratory:  CBC Chemistry Profile UDS Psychotherapy:  Individual to address stressors  Medications:  Re-start Haldol 5 mg BID for mood control/psychosis, Minipress 1 mg hs for PTSD symptoms  Consultations:  None Discharge Concerns:  Continued substance abuse and treatment non-adherence  Estimated LOS: 24-48 hours Other:  Explore options to treat his substance abuse such as [facility] as  the patient denies frequent use of alcohol or drugs  Elmarie Shiley, NP 8/14/20173:26 PM

## 2015-12-15 ENCOUNTER — Encounter: Payer: Self-pay | Admitting: Emergency Medicine

## 2015-12-15 ENCOUNTER — Emergency Department
Admission: EM | Admit: 2015-12-15 | Discharge: 2015-12-15 | Disposition: A | Discharge status: Home / Self Care | Payer: Self-pay | Attending: Emergency Medicine | Admitting: Emergency Medicine

## 2015-12-15 DIAGNOSIS — T434X5A Adverse effect of butyrophenone and thiothixene neuroleptics, initial encounter: Secondary | ICD-10-CM | POA: Insufficient documentation

## 2015-12-15 DIAGNOSIS — Y829 Unspecified medical devices associated with adverse incidents: Secondary | ICD-10-CM | POA: Insufficient documentation

## 2015-12-15 DIAGNOSIS — F17210 Nicotine dependence, cigarettes, uncomplicated: Secondary | ICD-10-CM | POA: Insufficient documentation

## 2015-12-15 DIAGNOSIS — J45909 Unspecified asthma, uncomplicated: Secondary | ICD-10-CM | POA: Insufficient documentation

## 2015-12-15 DIAGNOSIS — F319 Bipolar disorder, unspecified: Secondary | ICD-10-CM | POA: Diagnosis not present

## 2015-12-15 DIAGNOSIS — T887XXA Unspecified adverse effect of drug or medicament, initial encounter: Secondary | ICD-10-CM | POA: Insufficient documentation

## 2015-12-15 DIAGNOSIS — G2402 Drug induced acute dystonia: Secondary | ICD-10-CM | POA: Insufficient documentation

## 2015-12-15 MED ORDER — HALOPERIDOL 5 MG PO TABS: 5.0000 mg | ORAL_TABLET | Freq: Two times a day (BID) | ORAL | 0 refills | Status: DC

## 2015-12-15 MED ORDER — ADULT MULTIVITAMIN W/MINERALS CH: 1.0000 | ORAL_TABLET | Freq: Every day | ORAL | 0 refills | Status: DC

## 2015-12-15 MED ORDER — NICOTINE 21 MG/24HR TD PT24: 21.0000 mg | MEDICATED_PATCH | Freq: Every day | TRANSDERMAL | 0 refills | Status: DC

## 2015-12-15 MED ORDER — IBUPROFEN 600 MG PO TABS: 600.0000 mg | ORAL_TABLET | Freq: Three times a day (TID) | ORAL | 0 refills | Status: DC | PRN

## 2015-12-15 MED ORDER — DIPHENHYDRAMINE HCL 50 MG/ML IJ SOLN
50.0000 mg | Freq: Once | INTRAMUSCULAR | Status: AC
  Administered 2015-12-15: 50 mg via INTRAVENOUS
  Filled 2015-12-15: qty 1

## 2015-12-15 MED ORDER — BENZTROPINE MESYLATE 0.5 MG PO TABS: 0.5000 mg | ORAL_TABLET | Freq: Two times a day (BID) | ORAL | 0 refills | Status: DC

## 2015-12-15 MED ORDER — TRAZODONE HCL 50 MG PO TABS: 50.0000 mg | ORAL_TABLET | Freq: Every evening | ORAL | 0 refills | Status: DC | PRN

## 2015-12-15 NOTE — ED Provider Notes (Signed)
Time Seen: Approximately 2018  I have reviewed the triage notes  Chief Complaint: Allergic Reaction   History of Present Illness: Raymond Church is a 25 y.o. male who was recently [facility]. Patient states he was given Haldol at approximately 11 AM and states it feels like his neck is stuck on the left side. He has a hard time straightening out his neck and opening his jaw. This is a 7 similar issues with Haldol in the past. He states this is the third day and he was given a dose just prior to discharge locally to [facility].   Past Medical History:  Diagnosis Date  . Asthma   . Bipolar disorder (H[facility])   . Depression     Patient Active Problem List   Diagnosis Date Noted  . Bipolar 1 disorder, depressed (HCC) 12/13/2015  . Bipolar 1 disorder, mixed, moderate ([facility]) 09/08/2014  . Cocaine use disorder, severe, dependence ([facility]) 09/08/2014  . Cannabis use disorder, severe, dependence ([facility]) 09/08/2014    History reviewed. No pertinent surgical history.  History reviewed. No pertinent surgical history.  Current Outpatient Rx  . Order #: [id number] Class: Normal  . Order #: [id number] Class: Normal  . Order #: [id number] Class: No Print  . Order #: [id number] Class: No Print  . Order #: [id number] Class: No Print  . Order #: [id number] Class: No Print    Allergies:  Shellfish allergy; Amoxicillin; Mushroom extract complex; and Penicillins  Family History: Family History  Problem Relation Age of Onset  . Bipolar disorder Mother   . Drug abuse Mother     Social History: Social History  Substance Use Topics  . Smoking status: Current Every Day Smoker    Packs/day: 0.25    Years: 10.00    Types: Cigarettes  . Smokeless tobacco: Never Used  . Alcohol use Yes     Comment: occasional      Review of Systems:   10 point review of systems was performed and was otherwise negative:  Constitutional: No fever Eyes: No visual disturbances ENT: No  sore throat, ear pain Cardiac: No chest pain Respiratory: No shortness of breath, wheezing, or stridor Abdomen: No abdominal pain, no vomiting, No diarrhea Endocrine: No weight loss, No night sweats Extremities: No peripheral edema, cyanosis Skin: No rashes, easy bruising Neurologic: No focal weakness, trouble with speech or swollowing Urologic: No dysuria, Hematuria, or urinary frequency Patient states he is stable from a mental status standpoint and denies any suicidal thoughts, homicidal thoughts, hallucinations.  Physical Exam:  ED Triage Vitals  Enc Vitals Group     BP 12/15/15 2017 113/70     Pulse Rate 12/15/15 2017 (!) 101     Resp 12/15/15 2017 15     Temp 12/15/15 2017 98.1 F (36.7 C)     Temp Source 12/15/15 2017 Oral     SpO2 12/15/15 2017 99 %     Weight 12/15/15 2018 135 lb (61.2 kg)     Height 12/15/15 2018 5\' 8"  (1.727 m)     Head Circumference --      Peak Flow --      Pain Score 12/15/15 2018 8     Pain Loc --      Pain Edu? --      Excl. in GC? --     General: Awake , Alert , and Oriented times 3; GCS 15 Head: Normal cephalic , atraumatic Eyes: Pupils equal , round, reactive to light Nose/Throat: No nasal drainage,  patent upper airway without erythema or exudate.  Neck: Patient appears to have spasm of the left side of his neck and jaw and has difficulty moving his head to the left and opening his mouth at this point. No midline neck pain and no meningeal signs. Lungs: Clear to ascultation without wheezes , rhonchi, or rales Heart: Regular rate, regular rhythm without murmurs , gallops , or rubs Abdomen: Soft, non tender without rebound, guarding , or rigidity; bowel sounds positive and symmetric in all 4 quadrants. No organomegaly .        Extremities: 2 plus symmetric pulses. No edema, clubbing or cyanosis Neurologic: normal ambulation, Motor symmetric without deficits, sensory intact Skin: warm, dry, no rashes    ED Course:  Patient was given IV  Benadryl here with total resolution of symptoms. He'll be discharged back to [facility] for further treatment of chemical dependency and bipolar disorder. Patient was able to eat and drink here in emergency department and will be discharged and transported there by cab   Clinical Course     Assessment:  Dystonic reaction   Final Clinical Impression:  Final diagnoses:  Dystonic drug reaction     Plan:  Outpatient Patient was advised to return immediately if condition worsens. Patient was advised to follow up with their primary care physician or other specialized physicians involved in their outpatient care. The patient and/or family member/power of attorney had laboratory results reviewed at the bedside. All questions and concerns were addressed and appropriate discharge instructions were distributed by the nursing staff.            Jennye MoccasinBrian S Quigley, MD 12/15/15 2239

## 2015-12-15 NOTE — BHH Counselor (Addendum)
Pt was accepted to [facility] and is expected to arrive around 2pm. Pt will be [facility] [place] and then he will be provided with bus fare for the [place] [employer] bus to [employer] and then catch the [employer] bus to the above mentioned address and ask for [employer].

## 2015-12-15 NOTE — ED Notes (Signed)
Patient given water and [place] tray.

## 2015-12-15 NOTE — Progress Notes (Signed)
D: Patient drowsy this morning;  Reports that he still feels something is not right in his head.  Denies suicidal and homicidal ideation. He denies AVH;  No self-injurious behaviors noted or reported. A:  Emotional support provided; encouraged him to seek assistance with needs/concerns. R:  Safety maintained on unit.

## 2015-12-15 NOTE — ED Triage Notes (Signed)
Per [facility], patient comes from [facility] (referred by [facility] where he stayed two nights). Patient took haldol around 1100. Patient is here for a possible allergic reaction. Patients neck is "stuck" to left side. Patient states he is unable to straighten his neck. Patient c/o neck and jaw pain. Patient noticed symptoms around 1830.

## 2015-12-15 NOTE — Progress Notes (Addendum)
Written/verbal discharge instructions, prescriptions, sample medications and follow-up appointments given to patient with verbalization of understanding;  Patient denies suicidal and homicidal ideation. Discharged home in stable condition.  All patient belongings returned to patient at time of discharge. Given bus pass for transport.

## 2015-12-15 NOTE — Discharge Summary (Signed)
[facility] Discharge Summary Note  Patient:  Raymond Church is an 25 y.o., male MRN:  [id number] DOB:  Jul 29, 1990 Patient phone:  [phone] (home)  Patient address:   [place] [address],  Total Time spent with patient: Greater than 30 minutes  Date of Admission:  12/13/2015 Date of Discharge: 12/15/2015  Reason for Admission: Mood stabilization treatments  Principal Problem: Bipolar 1 disorder, depressed [facility]) Discharge Diagnoses: Patient Active Problem List   Diagnosis Date Noted  . Bipolar 1 disorder, depressed ([place]) [F31.9] 12/13/2015  . Bipolar 1 disorder, mixed, moderate ([place]) [F31.62] 09/08/2014  . Cocaine use disorder, severe, dependence ([place]) [F14.20] 09/08/2014  . Cannabis use disorder, severe, dependence ([place]) [F12.20] 09/08/2014    Musculoskeletal: Strength & Muscle Tone: within normal limits Gait & Station: normal Patient leans: [name]  Physical Exam  Review of Systems  Psychiatric/Behavioral: Positive for substance abuse. Negative for hallucinations, memory loss and suicidal ideas. Depression: Stable. The patient does not have insomnia. Nervous/anxious: Stable.   All other systems reviewed and are negative.   Blood pressure 106/68, pulse (!) 105, temperature 98.6 F (37 C), temperature source Oral, resp. rate 16, height 5' 7.99" (1.727 m), weight 63.5 kg (140 lb), SpO2 100 %.Body mass index is 21.29 kg/m.     Has this patient used any form of tobacco in the last 30 days? (Cigarettes, Smokeless Tobacco, Cigars, and/or Pipes) Yes, Prescription not provided because: Patient requested  Past Medical History:  Past Medical History:  Diagnosis Date  . Asthma   . Bipolar disorder ([place])   . Depression    History reviewed. No pertinent surgical history. Family History:  Family History  Problem Relation Age of Onset  . Bipolar disorder Mother   . Drug abuse Mother    Social History:  History  Alcohol Use  . Yes    Comment:  occasional      History  Drug Use  . Frequency: 5.0 times per week  . Types: Cocaine, Marijuana, MDMA (Ecstacy)    Comment: Pt endorsed current use of cocaine, THC 12/13/15    Social History   Social History  . Marital status: Single    Spouse name: [name]  . Number of children: [name]  . Years of education: [name]   Social History Main Topics  . Smoking status: Current Every Day Smoker    Packs/day: 0.25    Years: 10.00    Types: Cigarettes  . Smokeless tobacco: Never Used  . Alcohol use Yes     Comment: occasional   . Drug use:     Frequency: 5.0 times per week    Types: Cocaine, Marijuana, MDMA (Ecstacy)     Comment: Pt endorsed current use of cocaine, THC 12/13/15  . Sexual activity: Yes   Other Topics Concern  . None   Social History Narrative  . None   Risk to Self: Is patient at risk for suicide?: No Risk to Others:   Prior Inpatient Therapy:  yes  Prior Outpatient Therapy:  yes  Level of Care:  OP  Hospital Course:   Raymond Nixon Pettyis a 25 y.o.malewho presented voluntarily to [facility] with complaint of "not feeling right." He has a history of Bipolar Disorder and cocaine use. He has been treated inpatient  in 2016 and 2015 as well for suicidal and homicidal ideation. The patient has experienced difficulty expressing his symptoms making comments such as "My head does not feel right. I feel depressed like giving up." Patient stated  that he is experiencing poor sleep, poor appetite, tearfulness, and persistent despondency. He also endorsed episodic use of cocaine, with last use being on 12/11/15. Patient has a significant history of substance use including cocaine, marijuana, molly, benzos, and alcohol. HisUDS was positive for cocaine only with negative blood alcohol level. He reports infrequent use of cocaine. In addition to emotional health and substance use concerns, patient endorses problems with stable housing. At this time patient reports will be able to live with a  family member after discharge. He is also reporting vague psychotic symptoms such as "hearing clicks and seeing black spots that look like birds." Patient has been treated with Minipress before to address trauma related to loss of his mother who died in front of him. In the past he was referred to [facility] and [facility] for outpatient follow up. He reports going to [facility] "one time" and is unable to provide a rationale regarding why he did not return for further appointments.   Improvement was monitored by observation and Raymond Church daily report of symptom reduction. Patient was restarted on Haldol to improve his ability to function. He was discharged on this medication in the past after being treated at [facility] in 2016.         Raymond Church was evaluated by the Observation Unit Provider for stability and plans for continued recovery upon discharge. There was no evidence of any acute psychotic process during assessment on day of discharge.  Raymond Church was an integral factor for scheduling further treatment.  Employment, transportation, bed availability, health status, family support, and any pending legal issues were also considered during his hospital stay.  He was offered further treatment options upon discharge including but not limited to Residential, Intensive Outpatient, and Outpatient treatment.  Raymond Church will follow up with the services as listed below under Follow Up Information. Patient was accepted to [facility] and is expected to arrive around 2pm on day of discharge. Patient will be provided with a bus ticket to [facility] and then he will be provided with bus fare for the [place] bus to [employer] and then catch the [employer] bus. During his [place] admission it was noted that the patient was positive for cocaine, which was felt to be worsening his mental health symptoms in addition to non-adherence to recommended treatment regimen.   Upon completion of this  admission the patient was both mentally and medically stable for discharge denying suicidal/homicidal ideation, auditory/visual/tactile hallucinations, delusional thoughts and paranoia.      Consults:  psychiatry  Significant Diagnostic Studies:  labs: Valproic acid level, I-stat chem 8, I-stat venous blood, Troponin, BMP, UDS, ETOH, CMC/Diff  Discharge Vitals:   Blood pressure 106/68, pulse (!) 105, temperature 98.6 F (37 C), temperature source Oral, resp. rate 16, height 5' 7.99" (1.727 m), weight 63.5 kg (140 lb), SpO2 100 %. Body mass index is 21.29 kg/m. Lab Results:   Results for orders placed or performed during the hospital encounter of 12/13/15 (from the past 72 hour(s))  Rapid urine drug screen (hospital performed)     Status: Abnormal   Collection Time: 12/13/15  7:45 AM  Result Value Ref Range   Opiates NONE DETECTED NONE DETECTED   Cocaine POSITIVE (A) NONE DETECTED   Benzodiazepines NONE DETECTED NONE DETECTED   Amphetamines NONE DETECTED NONE DETECTED   Tetrahydrocannabinol NONE DETECTED NONE DETECTED   Barbiturates NONE DETECTED NONE DETECTED    Comment:  DRUG SCREEN FOR MEDICAL PURPOSES ONLY.  IF CONFIRMATION IS NEEDED FOR ANY PURPOSE, NOTIFY LAB WITHIN 5 DAYS.        LOWEST DETECTABLE LIMITS FOR URINE DRUG SCREEN Drug Class       Cutoff (ng/mL) Amphetamine      1000 Barbiturate      200 Benzodiazepine   200 Tricyclics       300 Opiates          300 Cocaine          300 THC              50   Comprehensive metabolic panel     Status: Abnormal   Collection Time: 12/13/15 10:08 AM  Result Value Ref Range   Sodium 137 135 - 145 mmol/L   Potassium 4.1 3.5 - 5.1 mmol/L   Chloride 104 101 - 111 mmol/L   CO2 29 22 - 32 mmol/L   Glucose, Bld 91 65 - 99 mg/dL   BUN 6 6 - 20 mg/dL   Creatinine, Ser 4.68 0.61 - 1.24 mg/dL   Calcium 9.0 8.9 - 03.2 mg/dL   Total Protein 6.5 6.5 - 8.1 g/dL   Albumin 3.9 3.5 - 5.0 g/dL   AST 21 15 - 41 U/L   ALT 15 (L) 17  - 63 U/L   Alkaline Phosphatase 33 (L) 38 - 126 U/L   Total Bilirubin 0.4 0.3 - 1.2 mg/dL   GFR calc non Af Amer >60 >60 mL/min   GFR calc Af Amer >60 >60 mL/min    Comment: (NOTE) The eGFR has been calculated using the CKD EPI equation. This calculation has not been validated in all clinical situations. eGFR's persistently <60 mL/min signify possible Chronic Kidney Disease.    Anion gap 4 (L) 5 - 15  cbc     Status: Abnormal   Collection Time: 12/13/15 10:08 AM  Result Value Ref Range   WBC 8.4 4.0 - 10.5 K/uL   RBC 4.16 (L) 4.22 - 5.81 MIL/uL   Hemoglobin 12.9 (L) 13.0 - 17.0 g/dL   HCT 12.2 48.2 - 50.0 %   MCV 95.2 78.0 - 100.0 fL   MCH 31.0 26.0 - 34.0 pg   MCHC 32.6 30.0 - 36.0 g/dL   RDW 37.0 48.8 - 89.1 %   Platelets 302 150 - 400 K/uL  Ethanol     Status: None   Collection Time: 12/13/15 10:09 AM  Result Value Ref Range   Alcohol, Ethyl (B) <5 <5 mg/dL    Comment:        LOWEST DETECTABLE LIMIT FOR SERUM ALCOHOL IS 5 mg/dL FOR MEDICAL PURPOSES ONLY   Salicylate level     Status: None   Collection Time: 12/13/15 10:09 AM  Result Value Ref Range   Salicylate Lvl <4.0 2.8 - 30.0 mg/dL  Acetaminophen level     Status: Abnormal   Collection Time: 12/13/15 10:09 AM  Result Value Ref Range   Acetaminophen (Tylenol), Serum <10 (L) 10 - 30 ug/mL    Comment:        THERAPEUTIC CONCENTRATIONS VARY SIGNIFICANTLY. A RANGE OF 10-30 ug/mL MAY BE AN EFFECTIVE CONCENTRATION FOR MANY PATIENTS. HOWEVER, SOME ARE BEST TREATED AT CONCENTRATIONS OUTSIDE THIS RANGE. ACETAMINOPHEN CONCENTRATIONS >150 ug/mL AT 4 HOURS AFTER INGESTION AND >50 ug/mL AT 12 HOURS AFTER INGESTION ARE OFTEN ASSOCIATED WITH TOXIC REACTIONS.     Physical Findings: AIMS: Facial and Oral Movements Muscles of Facial Expression: None, normal Lips  and Perioral Area: None, normal Jaw: None, normal Tongue: None, normal,Extremity Movements Upper (arms, wrists, hands, fingers): None, normal Lower  (legs, knees, ankles, toes): None, normal, Trunk Movements Neck, shoulders, hips: None, normal, Overall Severity Severity of abnormal movements (highest score from questions above): None, normal Incapacitation due to abnormal movements: None, normal Patient's awareness of abnormal movements (rate only patient's report): No Awareness, Dental Status Current problems with teeth and/or dentures?: No Does patient usually wear dentures?: No  CIWA:    COWS:     Discharge destination:  Home  Is patient on multiple antipsychotic therapies at discharge:  No   Has Patient had three or more failed trials of antipsychotic monotherapy by history:  No  Recommended Plan for Multiple Antipsychotic Therapies: NA     Medication List    STOP taking these medications   divalproex 250 MG 24 hr tablet Commonly known as:  DEPAKOTE ER   prazosin 1 MG capsule Commonly known as:  MINIPRESS     TAKE these medications     Indication  benztropine 0.5 MG tablet Commonly known as:  COGENTIN Take 1 tablet (0.5 mg total) by mouth 2 (two) times daily. What changed:  medication strength  how much to take  additional instructions  Indication:  Extrapyramidal Reaction caused by Medications   haloperidol 5 MG tablet Commonly known as:  HALDOL Take 1 tablet (5 mg total) by mouth 2 (two) times daily. What changed:  how much to take  how to take this  when to take this  additional instructions  Indication:  Bipolar Disorder   hydrOXYzine 25 MG tablet Commonly known as:  ATARAX/VISTARIL Take 1 tablet (25 mg total) by mouth every 6 (six) hours as needed (anxiety/agitation).  Indication:  anxiety/agitation   ibuprofen 600 MG tablet Commonly known as:  ADVIL,MOTRIN Take 1 tablet (600 mg total) by mouth every 8 (eight) hours as needed (Temp > 38.3Celsius). What changed:  medication strength  how much to take  when to take this  reasons to take this  additional instructions  Indication:   Fever, Inflammation   multivitamin with minerals Tabs tablet Take 1 tablet by mouth daily. For nutritional support  Indication:  Nutritional Support, Vitamin Supplementation   nicotine 21 mg/24hr patch Commonly known as:  NICODERM CQ - dosed in mg/24 hours Place 1 patch (21 mg total) onto the skin daily.  Indication:  Nicotine Addiction   traZODone 50 MG tablet Commonly known as:  DESYREL Take 1 tablet (50 mg total) by mouth at bedtime and may repeat dose one time if needed. For sleep  Indication:  Trouble Sleeping      Follow-up Information    [facility] Follow up on 12/15/2015.   Why:  Pt will report to [facility] post discharge for Residential Treatment. Pt is expected to check at 2pm. Contact information: [address] [address] [phone]           Follow-up recommendations:  Activity:  As tolerated Diet:  As tolerated  Comments:   Patient has been instructed to take medications as prescribed; and report adverse effects to outpatient provider.  Follow up with primary doctor for any medical issues and If symptoms recur report to nearest emergency or crisis hot line.    Total Discharge Time: Greater than 30 minutes  Signed: Elmarie Shiley, NP-C 12/15/2015, 3:14 PM

## 2015-12-15 NOTE — Discharge Instructions (Signed)
[facility] Address: [address], [place] [address]  Phone: [phone]  Pt [facility] and then he will be provided with bus fare [place] the [place] bus to [employer] and then catch the [employer] bus to the above mentioned address and ask for [employer].

## 2015-12-15 NOTE — ED Notes (Signed)
Patient feel full relief. Patient able to move neck completely. Denies pain.

## 2015-12-15 NOTE — [facility] Counselor (Signed)
This writer faxed out supporting documentation to [facility] in [place] for patient placement. Pt signed a [name]. 

## 2015-12-15 NOTE — Discharge Instructions (Signed)
Over-the-counter Benadryl if the dystonic reaction reoccurs. Please stop the Haldol as this most likely is the source of the patient's reaction. Please continue all other medications.  Please return immediately if condition worsens. Please contact her primary physician or the physician you were given for referral. If you have any specialist physicians involved in her treatment and plan please also contact them. Thank you for using [name] emergency [facility].

## 2015-12-19 ENCOUNTER — Emergency Department (HOSPITAL_COMMUNITY)
Admission: EM | Admit: 2015-12-19 | Discharge: 2015-12-20 | Disposition: A | Discharge status: Home / Self Care | Payer: Self-pay | Attending: Emergency Medicine | Admitting: Emergency Medicine

## 2015-12-19 ENCOUNTER — Ambulatory Visit (HOSPITAL_COMMUNITY)
Admission: AD | Admit: 2015-12-19 | Discharge: 2015-12-19 | Disposition: A | Discharge status: Home / Self Care | Payer: Federal, State, Local not specified - Other | Attending: Psychiatry | Admitting: Psychiatry

## 2015-12-19 ENCOUNTER — Encounter (HOSPITAL_COMMUNITY): Payer: Self-pay | Admitting: Emergency Medicine

## 2015-12-19 ENCOUNTER — Emergency Department (HOSPITAL_COMMUNITY): Payer: Self-pay

## 2015-12-19 DIAGNOSIS — R7989 Other specified abnormal findings of blood chemistry: Secondary | ICD-10-CM

## 2015-12-19 DIAGNOSIS — R079 Chest pain, unspecified: Secondary | ICD-10-CM | POA: Insufficient documentation

## 2015-12-19 DIAGNOSIS — F1290 Cannabis use, unspecified, uncomplicated: Secondary | ICD-10-CM | POA: Insufficient documentation

## 2015-12-19 DIAGNOSIS — Z79899 Other long term (current) drug therapy: Secondary | ICD-10-CM | POA: Insufficient documentation

## 2015-12-19 DIAGNOSIS — F3162 Bipolar disorder, current episode mixed, moderate: Secondary | ICD-10-CM | POA: Diagnosis present

## 2015-12-19 DIAGNOSIS — R778 Other specified abnormalities of plasma proteins: Secondary | ICD-10-CM

## 2015-12-19 DIAGNOSIS — I319 Disease of pericardium, unspecified: Secondary | ICD-10-CM

## 2015-12-19 DIAGNOSIS — T434X5A Adverse effect of butyrophenone and thiothixene neuroleptics, initial encounter: Secondary | ICD-10-CM | POA: Insufficient documentation

## 2015-12-19 DIAGNOSIS — F17210 Nicotine dependence, cigarettes, uncomplicated: Secondary | ICD-10-CM | POA: Insufficient documentation

## 2015-12-19 DIAGNOSIS — I309 Acute pericarditis, unspecified: Secondary | ICD-10-CM | POA: Insufficient documentation

## 2015-12-19 DIAGNOSIS — T50905A Adverse effect of unspecified drugs, medicaments and biological substances, initial encounter: Secondary | ICD-10-CM

## 2015-12-19 DIAGNOSIS — J45909 Unspecified asthma, uncomplicated: Secondary | ICD-10-CM | POA: Insufficient documentation

## 2015-12-19 DIAGNOSIS — E860 Dehydration: Secondary | ICD-10-CM | POA: Insufficient documentation

## 2015-12-19 LAB — CBC WITH DIFFERENTIAL/PLATELET
BASOS ABS: 0 10*3/uL (ref 0.0–0.1)
Basophils Relative: 0 %
EOS PCT: 0 %
Eosinophils Absolute: 0 10*3/uL (ref 0.0–0.7)
HCT: 31.7 % — ABNORMAL LOW (ref 39.0–52.0)
HEMOGLOBIN: 10.6 g/dL — AB (ref 13.0–17.0)
LYMPHS ABS: 2.2 10*3/uL (ref 0.7–4.0)
LYMPHS PCT: 16 %
MCH: 31.2 pg (ref 26.0–34.0)
MCHC: 33.4 g/dL (ref 30.0–36.0)
MCV: 93.2 fL (ref 78.0–100.0)
Monocytes Absolute: 1.2 10*3/uL — ABNORMAL HIGH (ref 0.1–1.0)
Monocytes Relative: 9 %
NEUTROS PCT: 75 %
Neutro Abs: 10.5 10*3/uL — ABNORMAL HIGH (ref 1.7–7.7)
PLATELETS: 232 10*3/uL (ref 150–400)
RBC: 3.4 MIL/uL — AB (ref 4.22–5.81)
RDW: 15.5 % (ref 11.5–15.5)
WBC: 14 10*3/uL — AB (ref 4.0–10.5)

## 2015-12-19 LAB — RAPID URINE DRUG SCREEN, HOSP PERFORMED
AMPHETAMINES: NOT DETECTED
BENZODIAZEPINES: POSITIVE — AB
Barbiturates: NOT DETECTED
Cocaine: NOT DETECTED
OPIATES: NOT DETECTED
TETRAHYDROCANNABINOL: POSITIVE — AB

## 2015-12-19 LAB — COMPREHENSIVE METABOLIC PANEL
ALT: 32 U/L (ref 17–63)
ANION GAP: 10 (ref 5–15)
AST: 41 U/L (ref 15–41)
Albumin: 3.4 g/dL — ABNORMAL LOW (ref 3.5–5.0)
Alkaline Phosphatase: 34 U/L — ABNORMAL LOW (ref 38–126)
BUN: 11 mg/dL (ref 6–20)
CHLORIDE: 106 mmol/L (ref 101–111)
CO2: 25 mmol/L (ref 22–32)
Calcium: 8.9 mg/dL (ref 8.9–10.3)
Creatinine, Ser: 1.12 mg/dL (ref 0.61–1.24)
Glucose, Bld: 103 mg/dL — ABNORMAL HIGH (ref 65–99)
POTASSIUM: 3.8 mmol/L (ref 3.5–5.1)
SODIUM: 141 mmol/L (ref 135–145)
Total Bilirubin: 0.1 mg/dL — ABNORMAL LOW (ref 0.3–1.2)
Total Protein: 6 g/dL — ABNORMAL LOW (ref 6.5–8.1)

## 2015-12-19 LAB — TROPONIN I
TROPONIN I: 0.25 ng/mL — AB (ref <0.03)
Troponin I: 0.26 ng/mL (ref <0.03)
Troponin I: 0.29 ng/mL (ref <0.03)

## 2015-12-19 LAB — ETHANOL

## 2015-12-19 LAB — D-DIMER, QUANTITATIVE (NOT AT ARMC)

## 2015-12-19 MED ORDER — LORAZEPAM 1 MG PO TABS: 0.0000 mg | ORAL_TABLET | Freq: Two times a day (BID) | ORAL | Status: DC

## 2015-12-19 MED ORDER — NICOTINE 21 MG/24HR TD PT24
21.0000 mg | MEDICATED_PATCH | Freq: Every day | TRANSDERMAL | Status: DC
  Administered 2015-12-19 – 2015-12-20 (×2): 21 mg via TRANSDERMAL
  Filled 2015-12-19 (×2): qty 1

## 2015-12-19 MED ORDER — INDOMETHACIN 25 MG PO CAPS
25.0000 mg | ORAL_CAPSULE | Freq: Three times a day (TID) | ORAL | Status: DC
  Administered 2015-12-19 – 2015-12-20 (×3): 25 mg via ORAL
  Filled 2015-12-19 (×4): qty 1

## 2015-12-19 MED ORDER — BENZTROPINE MESYLATE 1 MG PO TABS
0.5000 mg | ORAL_TABLET | Freq: Two times a day (BID) | ORAL | Status: DC
  Administered 2015-12-19 – 2015-12-20 (×2): 0.5 mg via ORAL
  Filled 2015-12-19 (×2): qty 1

## 2015-12-19 MED ORDER — ASPIRIN 81 MG PO CHEW
324.0000 mg | CHEWABLE_TABLET | Freq: Once | ORAL | Status: AC
  Administered 2015-12-19: 324 mg via ORAL
  Filled 2015-12-19: qty 4

## 2015-12-19 MED ORDER — SODIUM CHLORIDE 0.9 % IV BOLUS (SEPSIS)
1000.0000 mL | Freq: Once | INTRAVENOUS | Status: AC
  Administered 2015-12-19: 1000 mL via INTRAVENOUS

## 2015-12-19 MED ORDER — TRAZODONE HCL 50 MG PO TABS
50.0000 mg | ORAL_TABLET | Freq: Every evening | ORAL | Status: DC | PRN
  Administered 2015-12-19: 50 mg via ORAL
  Filled 2015-12-19: qty 1

## 2015-12-19 MED ORDER — VITAMIN B-1 100 MG PO TABS
100.0000 mg | ORAL_TABLET | Freq: Every day | ORAL | Status: DC
  Administered 2015-12-19 – 2015-12-20 (×2): 100 mg via ORAL
  Filled 2015-12-19 (×2): qty 1

## 2015-12-19 MED ORDER — THIAMINE HCL 100 MG/ML IJ SOLN: 100.0000 mg | Freq: Every day | INTRAMUSCULAR | Status: DC

## 2015-12-19 MED ORDER — LORAZEPAM 1 MG PO TABS: 0.0000 mg | ORAL_TABLET | Freq: Four times a day (QID) | ORAL | Status: DC

## 2015-12-19 MED ORDER — ACETAMINOPHEN 325 MG PO TABS
650.0000 mg | ORAL_TABLET | Freq: Once | ORAL | Status: AC
  Administered 2015-12-19: 650 mg via ORAL
  Filled 2015-12-19: qty 2

## 2015-12-19 NOTE — ED Notes (Signed)
EKG handed to Dr. Manus Gunningancour for review

## 2015-12-19 NOTE — ED Notes (Signed)
SBAR Report received from previous nurse. Pt received calm and visible on unit. Pt denies current  HI, A/V H, anxiety, and rated pain 6/10 at this time, but endorses SI and depression without plan at this time. Pt simply states " I'm just tired" is otherwise stable. Pt reminded of camera surveillance, q 15 min rounds, and rules of the milieu. Will continue to assess.

## 2015-12-19 NOTE — ED Triage Notes (Signed)
[facility] On arrival pt c/o chest pain, dizziness, "seeing spots" and unable to taste anything. Pt states this began after taking haldol @2300  last night. Pt denies SI/HI. Pt states he [facility] taking Haldol [facility]

## 2015-12-19 NOTE — BH Assessment (Signed)
Assessment Note  Raymond Church is an 25 y.o. male who [facility].  The Patient reports current suicidal ideations and "I'm tired of living like this .Marland Kitchen.I'm giving up..I'm tired."  Patient presented orientated x4, drowsy, and reports "I'm hungry."  Patient reports mood as "depressed and sad" and affect was congruent with mood.  Patient denied HI and reports currently experiencing VH of "black specks and birds" and AH of "crickets and locust sounds."  Patient reports not having an appetite for the past 2 weeks and sleeping 1 or 2 hours nightly during the that time frame.  Patient reports snorting 1 or 2 lines of cocaine last week and taking 3 to 4 puffs off a blunt yesterday.  Patient's UDS was positive for cocaine and THC.  Patient reports being medication adherent to prescribed Haldol since being prescribed on 12-13-2015 when [facility]Marland Kitchen.  Patient reports being transferred to [facility] after OBS, however he reports being unsure of the treatment protocol.  Patient reports being homeless and estranged from immediate family members.  Patient meets inpatient criteria and outside placement will be sought.    Diagnosis: Bipolar I, Cocaine Use Disorder, severe, Cannabis Use Disorder, mild   Past Medical History:  Past Medical History:  Diagnosis Date  . Asthma   . Bipolar disorder (HCC)   . Depression     History reviewed. No pertinent surgical history.  Family History:  Family History  Problem Relation Age of Onset  . Bipolar disorder Mother   . Drug abuse Mother     Social History:  reports that he has been smoking Cigarettes.  He has a 2.50 pack-year smoking history. He has never used smokeless tobacco. He reports that he drinks alcohol. He reports that he uses drugs, including Cocaine, Marijuana, and MDMA (Ecstacy), about 5 times per week.  Additional Social History:     CIWA: CIWA-Ar BP: 124/66 Pulse Rate: 110 COWS:    Allergies:  Allergies  Allergen  Reactions  . Shellfish Allergy Anaphylaxis  . Amoxicillin Other (See Comments)    Has patient had a PCN reaction causing immediate rash, facial/tongue/throat swelling, SOB or lightheadedness with hypotension: No Has patient had a PCN reaction causing severe rash involving mucus membranes or skin necrosis: No Has patient had a PCN reaction that required hospitalization No Has patient had a PCN reaction occurring within the last 10 years: No If all of the above answers are "NO", then may proceed with Cephalosporin use.  . Mushroom Extract Complex Nausea And Vomiting  . Penicillins Other (See Comments)    Has patient had a PCN reaction causing immediate rash, facial/tongue/throat swelling, SOB or lightheadedness with hypotension: No Has patient had a PCN reaction causing severe rash involving mucus membranes or skin necrosis: No Has patient had a PCN reaction that required hospitalization No Has patient had a PCN reaction occurring within the last 10 years: No If all of the above answers are "NO", then may proceed with Cephalosporin use.    Home Medications:  (Not in a hospital admission)  OB/GYN Status:  No LMP for male patient.  General Assessment Data Location of Assessment: [facility] TTS Assessment: In system Is this a Tele or Face-to-Face Assessment?: Tele Assessment Is this an Initial Assessment or a Re-assessment for this encounter?: Initial Assessment Marital status: Single Maiden name: [name] Is patient pregnant?: No Pregnancy Status: No Living Arrangements: Other (Comment) (Homeless) Can pt return to current living arrangement?: Yes Admission Status: Voluntary  Is patient capable of signing voluntary admission?: Yes Referral Source: Self/Family/Friend Insurance type: None  Medical Screening Exam [facility] Walk-in ONLY) Medical Exam completed: Yes  Crisis Care Plan Living Arrangements: Other (Comment) (Homeless) Legal Guardian: Other: (Self) Name of Psychiatrist: None  currently Name of Therapist: None currently  Education Status Is patient currently in school?: No Current Grade: [name] Highest grade of school patient has completed: 12th Name of school: [name] Contact person: [name]  Risk to self with the past 6 months Suicidal Ideation: Yes-Currently Present Has patient been a risk to self within the past 6 months prior to admission? : Yes Suicidal Intent: No Has patient had any suicidal intent within the past 6 months prior to admission? : No Is patient at risk for suicide?: Yes Suicidal Plan?: No-Not Currently/Within Last 6 Months Has patient had any suicidal plan within the past 6 months prior to admission? : No Access to Means: No What has been your use of drugs/alcohol within the last 12 months?: Cocaine and THC Previous Attempts/Gestures: No How many times?: 0 Other Self Harm Risks: None Triggers for Past Attempts: Unknown Intentional Self Injurious Behavior: None Family Suicide History: No Recent stressful life event(s): Conflict (Comment) (Estrangement from immediate family and homelessness) Persecutory voices/beliefs?: No Depression: Yes Depression Symptoms: Feeling worthless/self pity, Loss of interest in usual pleasures, Isolating, Insomnia Substance abuse history and/or treatment for substance abuse?: Yes Suicide prevention information given to non-admitted patients: Not applicable  Risk to Others within the past 6 months Homicidal Ideation: No Does patient have any lifetime risk of violence toward others beyond the six months prior to admission? : Yes (comment) Thoughts of Harm to Others: No Current Homicidal Intent: No Current Homicidal Plan: No Access to Homicidal Means: No Identified Victim: [name] History of harm to others?: No Assessment of Violence: None Noted Violent Behavior Description: [name] Does patient have access to weapons?: No Criminal Charges Pending?: No Does patient have a court date: No Is patient on probation?:  No  Psychosis Hallucinations: Auditory, Visual (hears crickets and locusts and sees black specks and virds) Delusions: None noted  Mental Status Report Appearance/Hygiene: In hospital gown Eye Contact: Fair Motor Activity: Unremarkable Speech: Logical/coherent Level of Consciousness: Drowsy Mood: Depressed, Sad Affect: Depressed, Sad Anxiety Level: Minimal Thought Processes: Circumstantial, Coherent, Relevant Judgement: Impaired Orientation: Person, Time, Place, Situation Obsessive Compulsive Thoughts/Behaviors: None  Cognitive Functioning Concentration: Good Memory: Recent Intact, Remote Intact IQ: Average Insight: Fair Impulse Control: Fair Appetite: Poor Weight Loss: 10 Weight Gain: 0 Sleep: Decreased Total Hours of Sleep: 2 Vegetative Symptoms: None  ADLScreening [facility] Assessment Services) Patient's cognitive ability adequate to safely complete daily activities?: Yes Patient able to express need for assistance with ADLs?: No Independently performs ADLs?: Yes (appropriate for developmental age)  Prior Inpatient Therapy Prior Inpatient Therapy: Yes Prior Therapy Dates: March 2017, 2016, 2015 Prior Therapy Facilty/Provider(s): [facility], [facility] Reason for Treatment: Bipolar  Prior Outpatient Therapy Prior Outpatient Therapy: Yes Prior Therapy Dates: 2016 Prior Therapy Facilty/Provider(s): [facility] Reason for Treatment: Bipolar Does patient have an ACCT team?: No Does patient have Intensive In-House Services?  : No Does patient have [facility] services? : No Does patient have P4CC services?: No  ADL Screening (condition at time of admission) Patient's cognitive ability adequate to safely complete daily activities?: Yes Is the patient deaf or have difficulty hearing?: No Does the patient have difficulty seeing, even when wearing glasses/contacts?: No Does the patient have difficulty concentrating, remembering, or making decisions?: No Patient able to express need for  assistance with ADLs?: No Does the patient have difficulty dressing or bathing?: No Independently performs ADLs?: Yes (appropriate for developmental age) Does the patient have difficulty walking or climbing stairs?: No Weakness of Arms/Hands: None  Home Assistive Devices/Equipment Home Assistive Devices/Equipment: None    Abuse/Neglect Assessment (Assessment to be complete while patient is alone) Physical Abuse: Denies Verbal Abuse: Denies Sexual Abuse: Denies Exploitation of patient/patient's resources: Denies Self-Neglect: Denies Values / Beliefs Cultural Requests During Hospitalization: None Spiritual Requests During Hospitalization: None   Advance Directives (For Healthcare) Does patient have an advance directive?: No Would patient like information on creating an advanced directive?: No - patient declined information    Additional Information 1:1 In Past 12 Months?: No CIRT Risk: No Elopement Risk: No Does patient have medical clearance?: Yes     Disposition:  Disposition Initial Assessment Completed for this Encounter: Yes Disposition of Patient: Inpatient treatment program Type of inpatient treatment program: Adult (Seek placement [facility])  On Site Evaluation by:   Reviewed with Physician:    Dey-Johnson,Alila Sotero 12/19/2015 4:01 PM

## 2015-12-19 NOTE — ED Notes (Signed)
Pt had dinner tray

## 2015-12-19 NOTE — ED Provider Notes (Signed)
Patient was seen overnight with chest pain after using Haldol which had resolved. He was discharged to go back to behavioral health but he was apparently never a patient there. We'll need TTS consult  Patient continues to complain of chest pain has been constant's S last night. Repeat EKG shows elevation in V4 and V5. Patient states chest pain that has been constant since last night. It is reproducible to palpation.  Previous EKG shows diffuse ST elevation consistent with probable pericarditis.  Repeat EKG now shows worsening elevation in V4 and V5. Troponin is positive at 0.25. Discussed with on-call interventional cardiologist Dr. Herbie BaltimoreHarding. He reviewed EKGs and feels this is likely secondary to pericarditis and does not wish to activate STEMI at this time.  Dr. Algie CofferKadakia has seen patient and feels EKG is similar to previous.  Recommends repeat troponin.  If stable or downtrending, can go home with pericarditis treatment.   Repeat troponin 0.26 from 0.25. Discussed with Dr. Algie CofferKadakia who feels patient can go home with treatment for pericarditis.  Start indomethacin. Observation admission d/w Dr. David StallFeliz Ortiz who feels no indication for admission with stable troponins.  Similar EKG ST segment elevation as previously.  Patient requests to speak with TTS. Denies SI or HI. But states he bipolar is "out of control."   Raymond OctaveStephen Achol Azpeitia, MD 12/19/15 1541

## 2015-12-19 NOTE — ED Provider Notes (Signed)
I assumed care of this patient from Dr. Manus Gunningancour at 1600.  Please see their note for further details of Hx, PE.  Briefly patient is a 25 y.o. male with a Chest Pain  that workup was concerning for pericarditis. Patient placed on indomethacin. Additionally patient requires psychiatric and TTS evaluation. He has been medically cleared; pending third troponin.  The troponin with no significant rise. Patient will continue treatment for pericarditis. Patient placed in behavioral hold to await TTS evaluation.     Nira ConnPedro Eduardo Cardama, MD 12/20/15 [phone]

## 2015-12-19 NOTE — ED Provider Notes (Signed)
[facility]-EMERGENCY DEPT Provider Note   CSN: [id number] Arrival date & time: 12/19/15  0443     History   Chief Complaint Chief Complaint  Patient presents with  . Chest Pain    HPI Matthew Loney Heringetty is a 25 y.o. male.  25 year old male presents from behavioral health for medical clearance. He states he took Haldol for the first time last night and has since had some muscle spasm, dry mouth and feeling of chest pain. The chest pain has been subsiding. Nothing appears to have made it better. The patient has felt quite anxious. He ambulated into the emergency department without difficulty. Patient does have a history of cocaine abuse previously.    Chest Pain      Past Medical History:  Diagnosis Date  . Asthma   . Bipolar disorder (HCC)   . Depression     Patient Active Problem List   Diagnosis Date Noted  . Bipolar 1 disorder, depressed (HCC) 12/13/2015  . Bipolar 1 disorder, mixed, moderate (HCC) 09/08/2014  . Cocaine use disorder, severe, dependence (HCC) 09/08/2014  . Cannabis use disorder, severe, dependence (HCC) 09/08/2014    History reviewed. No pertinent surgical history.     Home Medications    Prior to Admission medications   Medication Sig Start Date End Date Taking? Authorizing Provider  benztropine (COGENTIN) 0.5 MG tablet Take 1 tablet (0.5 mg total) by mouth 2 (two) times daily. 12/15/15   Thermon LeylandLaura A Davis, NP  hydrOXYzine (ATARAX/VISTARIL) 25 MG tablet Take 1 tablet (25 mg total) by mouth every 6 (six) hours as needed (anxiety/agitation). Patient not taking: Reported on 07/06/2015 09/10/14   Shuvon B Rankin, NP  ibuprofen (ADVIL,MOTRIN) 600 MG tablet Take 1 tablet (600 mg total) by mouth every 8 (eight) hours as needed (Temp > 38.3Celsius). 12/15/15   Thermon LeylandLaura A Davis, NP  Multiple Vitamin (MULTIVITAMIN WITH MINERALS) TABS tablet Take 1 tablet by mouth daily. For nutritional support 12/15/15   Thermon LeylandLaura A Davis, NP  nicotine (NICODERM CQ - DOSED IN MG/24 HOURS) 21  mg/24hr patch Place 1 patch (21 mg total) onto the skin daily. 12/15/15   Thermon LeylandLaura A Davis, NP  traZODone (DESYREL) 50 MG tablet Take 1 tablet (50 mg total) by mouth at bedtime and may repeat dose one time if needed. For sleep 12/15/15   Thermon LeylandLaura A Davis, NP    Family History Family History  Problem Relation Age of Onset  . Bipolar disorder Mother   . Drug abuse Mother     Social History Social History  Substance Use Topics  . Smoking status: Current Every Day Smoker    Packs/day: 0.25    Years: 10.00    Types: Cigarettes  . Smokeless tobacco: Never Used  . Alcohol use Yes     Comment: occasional      Allergies   Shellfish allergy; Amoxicillin; Mushroom extract complex; and Penicillins   Review of Systems Review of Systems  Cardiovascular: Positive for chest pain.  All other systems reviewed and are negative.    Physical Exam Updated Vital Signs BP (!) 101/51 (BP Location: Right Arm)   Pulse (!) 128   Temp 98.8 F (37.1 C) (Oral)   Resp 20   SpO2 99%   Physical Exam  Constitutional: He is oriented to person, place, and time. He appears well-developed and well-nourished. No distress.  HENT:  Head: Normocephalic and atraumatic.  Eyes: Conjunctivae are normal.  Neck: Neck supple. No tracheal deviation present.  Cardiovascular: Regular rhythm, S1 normal and  S2 normal.  Tachycardia present.  Exam reveals no gallop.   Murmur heard.  Systolic murmur is present with a grade of 2/6  Pulmonary/Chest: Effort normal and breath sounds normal. No respiratory distress.  Abdominal: Soft. He exhibits no distension.  Neurological: He is alert and oriented to person, place, and time. He has normal strength. No cranial nerve deficit. GCS eye subscore is 4. GCS verbal subscore is 5. GCS motor subscore is 6.  Skin: Skin is warm and dry.  Psychiatric: His mood appears anxious. His speech is rapid and/or pressured.     ED Treatments / Results  Labs (all labs ordered are listed, but  only abnormal results are displayed) Labs Reviewed  COMPREHENSIVE METABOLIC PANEL - Abnormal; Notable for the following:       Result Value   Glucose, Bld 103 (*)    Total Protein 6.0 (*)    Albumin 3.4 (*)    Alkaline Phosphatase 34 (*)    Total Bilirubin 0.1 (*)    All other components within normal limits  CBC WITH DIFFERENTIAL/PLATELET - Abnormal; Notable for the following:    WBC 14.0 (*)    RBC 3.40 (*)    Hemoglobin 10.6 (*)    HCT 31.7 (*)    Neutro Abs 10.5 (*)    Monocytes Absolute 1.2 (*)    All other components within normal limits  ETHANOL  URINE RAPID DRUG SCREEN, HOSP PERFORMED    EKG  EKG Interpretation  Date/Time:  Saturday December 19 2015 05:10:29 EDT Ventricular Rate:  121 PR Interval:    QRS Duration: 80 QT Interval:  332 QTC Calculation: 471 R Axis:   69 Text Interpretation:  Sinus tachycardia Probable left atrial enlargement Borderline prolonged QT interval No significant change since last tracing Confirmed by Kartik Fernando MD, Therese Rocco ([id number]) on 12/19/2015 5:14:31 AM       Radiology No results found.  Procedures Procedures (including critical care time)  Medications Ordered in ED Medications - No data to display   Initial Impression / Assessment and Plan / ED Course  I have reviewed the triage vital signs and the nursing notes.  Pertinent labs & imaging results that were available during my care of the patient were reviewed by me and considered in my medical decision making (see chart for details).  Clinical Course    25 y.o. male presents with Concern for multiple symptoms since taking Haldol last night. The patient's presenting symptoms are concerning for a sympathomimetic given his history of cocaine abuse. He does appear mildly clinically dehydrated. He was able to take food and water by mouth shortly after arrival. He rested comfortably without medical intervention. No dystonia here. EKG shows no signs of ischemia and patient is otherwise low  risk for heart disease so doubt ACS. No signs or symptoms of PE. At this time he appears stable for transfer back to the psychiatric facility.  MEDICALLY CLEAR FOR TRANSFER OR PSYCHIATRIC ADMISSION.   Final Clinical Impressions(s) / ED Diagnoses   Final diagnoses:  Mild dehydration  Medication adverse effect, initial encounter    New Prescriptions New Prescriptions   No medications on file     Lyndal Pulleyaniel Roosevelt Bisher, MD 12/19/15 [phone]

## 2015-12-19 NOTE — H&P (Signed)
Referring Physician:  [name] is an 25 y.o. male.                       Chief Complaint: Chest pain  HPI: 25 year old male with constant retrosternal and left sided and reproducible chest pain since yesterday with EKG changes of early repolarization. Troponin - I is minimally elevated. Patient has history of drug abuse in past.   Past Medical History:  Diagnosis Date  . Asthma   . Bipolar disorder ([place])   . Depression       History reviewed. No pertinent surgical history.  Family History  Problem Relation Age of Onset  . Bipolar disorder Mother   . Drug abuse Mother    Social History:  reports that he has been smoking Cigarettes.  He has a 2.50 pack-year smoking history. He has never used smokeless tobacco. He reports that he drinks alcohol. He reports that he uses drugs, including Cocaine, Marijuana, and MDMA (Ecstacy), about 5 times per week.  Allergies:  Allergies  Allergen Reactions  . Shellfish Allergy Anaphylaxis  . Amoxicillin Other (See Comments)    Has patient had a PCN reaction causing immediate rash, facial/tongue/throat swelling, SOB or lightheadedness with hypotension: No Has patient had a PCN reaction causing severe rash involving mucus membranes or skin necrosis: No Has patient had a PCN reaction that required hospitalization No Has patient had a PCN reaction occurring within the last 10 years: No If all of the above answers are "NO", then may proceed with Cephalosporin use.  . Mushroom Extract Complex Nausea And Vomiting  . Penicillins Other (See Comments)    Has patient had a PCN reaction causing immediate rash, facial/tongue/throat swelling, SOB or lightheadedness with hypotension: No Has patient had a PCN reaction causing severe rash involving mucus membranes or skin necrosis: No Has patient had a PCN reaction that required hospitalization No Has patient had a PCN reaction occurring within the last 10 years: No If all of the above answers are "NO", then  may proceed with Cephalosporin use.     (Not in a hospital admission)  Results for orders placed or performed during the hospital encounter of 12/19/15 (from the past 48 hour(s))  Comprehensive metabolic panel     Status: Abnormal   Collection Time: 12/19/15  5:40 AM  Result Value Ref Range   Sodium 141 135 - 145 mmol/L   Potassium 3.8 3.5 - 5.1 mmol/L   Chloride 106 101 - 111 mmol/L   CO2 25 22 - 32 mmol/L   Glucose, Bld 103 (H) 65 - 99 mg/dL   BUN 11 6 - 20 mg/dL   Creatinine, Ser 1.12 0.61 - 1.24 mg/dL   Calcium 8.9 8.9 - 10.3 mg/dL   Total Protein 6.0 (L) 6.5 - 8.1 g/dL   Albumin 3.4 (L) 3.5 - 5.0 g/dL   AST 41 15 - 41 U/L   ALT 32 17 - 63 U/L   Alkaline Phosphatase 34 (L) 38 - 126 U/L   Total Bilirubin 0.1 (L) 0.3 - 1.2 mg/dL   GFR calc non Af Amer >60 >60 mL/min   GFR calc Af Amer >60 >60 mL/min    Comment: (NOTE) The eGFR has been calculated using the CKD EPI equation. This calculation has not been validated in all clinical situations. eGFR's persistently <60 mL/min signify possible Chronic Kidney Disease.    Anion gap 10 5 - 15  Ethanol     Status: None  Collection Time: 12/19/15  5:40 AM  Result Value Ref Range   Alcohol, Ethyl (B) <5 <5 mg/dL    Comment:        LOWEST DETECTABLE LIMIT FOR SERUM ALCOHOL IS 5 mg/dL FOR MEDICAL PURPOSES ONLY   CBC with Diff     Status: Abnormal   Collection Time: 12/19/15  5:40 AM  Result Value Ref Range   WBC 14.0 (H) 4.0 - 10.5 K/uL   RBC 3.40 (L) 4.22 - 5.81 MIL/uL   Hemoglobin 10.6 (L) 13.0 - 17.0 g/dL   HCT 31.7 (L) 39.0 - 52.0 %   MCV 93.2 78.0 - 100.0 fL   MCH 31.2 26.0 - 34.0 pg   MCHC 33.4 30.0 - 36.0 g/dL   RDW 15.5 11.5 - 15.5 %   Platelets 232 150 - 400 K/uL   Neutrophils Relative % 75 %   Neutro Abs 10.5 (H) 1.7 - 7.7 K/uL   Lymphocytes Relative 16 %   Lymphs Abs 2.2 0.7 - 4.0 K/uL   Monocytes Relative 9 %   Monocytes Absolute 1.2 (H) 0.1 - 1.0 K/uL   Eosinophils Relative 0 %   Eosinophils Absolute 0.0  0.0 - 0.7 K/uL   Basophils Relative 0 %   Basophils Absolute 0.0 0.0 - 0.1 K/uL  Urine rapid drug screen (hosp performed)not at [facility]     Status: Abnormal   Collection Time: 12/19/15  7:08 AM  Result Value Ref Range   Opiates NONE DETECTED NONE DETECTED   Cocaine NONE DETECTED NONE DETECTED   Benzodiazepines POSITIVE (A) NONE DETECTED   Amphetamines NONE DETECTED NONE DETECTED   Tetrahydrocannabinol POSITIVE (A) NONE DETECTED   Barbiturates NONE DETECTED NONE DETECTED    Comment:        DRUG SCREEN FOR MEDICAL PURPOSES ONLY.  IF CONFIRMATION IS NEEDED FOR ANY PURPOSE, NOTIFY LAB WITHIN 5 DAYS.        LOWEST DETECTABLE LIMITS FOR URINE DRUG SCREEN Drug Class       Cutoff (ng/mL) Amphetamine      1000 Barbiturate      200 Benzodiazepine   569 Tricyclics       794 Opiates          300 Cocaine          300 THC              50   Troponin I     Status: Abnormal   Collection Time: 12/19/15  9:21 AM  Result Value Ref Range   Troponin I 0.25 (HH) <0.03 ng/mL    Comment: CRITICAL RESULT CALLED TO, READ BACK BY AND VERIFIED WITH: BROWN,L AT 1050 ON [id number] BY HOOKER,B   D-dimer, quantitative (not at [facility])     Status: None   Collection Time: 12/19/15  9:21 AM  Result Value Ref Range   D-Dimer, Quant <0.27 0.00 - 0.50 ug/mL-FEU    Comment: (NOTE) At the manufacturer cut-off of 0.50 ug/mL FEU, this assay has been documented to exclude PE with a sensitivity and negative predictive value of 97 to 99%.  At this time, this assay has not been approved by the FDA to exclude DVT/VTE. Results should be correlated with clinical presentation.    Dg Chest 2 View  Result Date: 12/19/2015 CLINICAL DATA:  Left-sided chest pain for 1 day EXAM: CHEST  2 VIEW COMPARISON:  Sep 07, 2014 FINDINGS: The lungs are clear. The heart size and pulmonary vascularity are normal. No adenopathy. No pneumothorax. No bone  lesions. There is an azygos lobe on the right, an anatomic variant. IMPRESSION: No edema or  consolidation. Electronically Signed   By: Lowella Grip III M.D.   On: 12/19/2015 09:37    Review Of Systems Respiratory: Shortness of breath, no asthma. Cardiovascular: Positive for chest pain. Gastrointestinal: No GI bleed, hepatitis or nausea, vomiting or diarrhea. GU: No hematuria or renal stone. CNS: No stroke. Psych:Recurrent admission for anxiety, depression and suicidal ideation with alcohol and drug abuse.  Blood pressure 103/67, pulse 111, temperature 97.6 F (36.4 C), temperature source Oral, resp. rate 18, SpO2 98 %. Physical Exam :  Constituitional: Well developed and averagely nourished.No distress, resting comfortably. HENT: Normocephalic, atraumatic. Brown eyes. Conj-pnk, Sclera-non-icteric. Neck- No JVD. No thyromegaly. Lungs: Clear. Heart: Regular rhythm. S1, S2 normal. No murmur or gallop. Abdomen: Soft. Extremities: Trace edema both lower legs. Skin: Warm and dry. CNS: Grossly intact. Moves all four extremities.   Assessment/Plan Chest pain, musculoskeletal H/O Drug use  Medical treatment. Tylenol for pain.  Repeat troponin-I. Life style modification.  Birdie Riddle, MD  12/19/2015, 12:01 PM

## 2015-12-19 NOTE — ED Notes (Signed)
Raymond Church continues to be in no distress.  He has been given several snacks, plus a noon meal today per his request.  He is aware of and agrees with, plan to admit.

## 2015-12-19 NOTE — BHH Counselor (Addendum)
Pt presented for assessment however, pt presented shortness of breath, shaking,  and wanted to been seen by Randa EvensJoanne , RN, [facility]. Mardene CelesteJoanna, RN, [facility].   Gwinda Passereylese D Bennett, MS, [facility], [facility] Triage Specialist [phone]

## 2015-12-19 NOTE — ED Notes (Signed)
Pt is unable to urinate at this time. 

## 2015-12-19 NOTE — ED Notes (Signed)
He is more alert and tells us he feels "better".  He is hungry, so we get him a snack.

## 2015-12-19 NOTE — ED Notes (Signed)
[facility] Expected date:  Expected time:  Means of arrival:  Comments: [id number]

## 2015-12-20 DIAGNOSIS — F3162 Bipolar disorder, current episode mixed, moderate: Secondary | ICD-10-CM

## 2015-12-20 MED ORDER — ALBUTEROL SULFATE HFA 108 (90 BASE) MCG/ACT IN AERS
1.0000 | INHALATION_SPRAY | RESPIRATORY_TRACT | Status: DC | PRN
Start: 2015-12-20 — End: 2015-12-20
  Administered 2015-12-20: 2 via RESPIRATORY_TRACT
  Filled 2015-12-20: qty 6.7

## 2015-12-20 MED ORDER — ACETAMINOPHEN 500 MG PO TABS
1000.0000 mg | ORAL_TABLET | Freq: Four times a day (QID) | ORAL | Status: DC | PRN
  Administered 2015-12-20: 1000 mg via ORAL
  Filled 2015-12-20: qty 2

## 2015-12-20 NOTE — Discharge Instructions (Signed)
For your continued mental health needs, it is strongly advised that you follow up with the agency listed below:  [facility] [address] [place], [place] [address] [phone] Walk-in hours are Monday - Friday from 8:00 am - 3:00 pm.  Walk-in patients are seen on a first come, first served basis. Try to arrive as early as possible for the best chance of being seen the same day.

## 2015-12-20 NOTE — ED Notes (Signed)
Pt compliant with morning medication regimen. Pt forwards little with this nurse. Denies SI/HI. Denies AVH, Special checks q 15 mins in place for safety. Video  Monitoring in place.

## 2015-12-20 NOTE — ED Provider Notes (Signed)
Pt notified nurse he has history of asthma and uses an inhlaer at home.  Requests his albuterol inhaler.  Alb HFA ordered.  Vitals:   12/19/15 2131 12/20/15 0618  BP: 113/70 130/60  Pulse: 105 91  Resp:  18  Temp:  98 F (36.7 C)      Linwood DibblesJon Jahmez Bily, MD 12/20/15 [phone]

## 2015-12-20 NOTE — Consult Note (Signed)
[place] Psychiatry Consult   Reason for Consult:  Drug reaction to haldol, "bipolar out of control" Referring Physician:  ED Provider Patient Identification: Raymond Church MRN:  [id number] Principal Diagnosis: Bipolar 1 disorder, mixed, moderate ([name]) Diagnosis:   Patient Active Problem List   Diagnosis Date Noted  . Bipolar 1 disorder, mixed, moderate ([name]) [F31.62] 09/08/2014    Priority: High  . Bipolar 1 disorder, depressed ([name]) [F31.9] 12/13/2015  . Cocaine use disorder, severe, dependence ([name]) [F14.20] 09/08/2014  . Cannabis use disorder, severe, dependence ([name]) [F12.20] 09/08/2014    Total Time spent with patient: 30 minutes  Subjective:   Raymond Church is a 25 y.o. male patient admitted with chest pain attributed to taking Haldol.  HPI:  Patient was seen by EDP overnight with chest pain after using Haldol which had resolved.  Psychiatry was asked to consult as patient was recently in [facility] in [place] unit (8/13) after which CSW facilitated outpatient services with [facility].  He stated after he finished with [facility], wherein he was prescribed Haldol, he developed chest pain.  Upon admission, he denied SI or HI. But states he bipolar is "out of control."  He has a history of Bipolar Disorder and cocaine use. He has been treated inpatient  in 2016 and 2015 as well for suicidal and homicidal ideation.  Patient has a significant history of substance use including cocaine, marijuana, molly, benzos, and alcohol.  When he was seen today, patient was calm and requested to be discharged.  He denies SI and HI.  He states that he was living with this god sister.  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  Past Psychiatric History:  See HPI  Risk to Self: Suicidal Ideation: Yes-Currently Present Suicidal Intent: No Is patient at risk for suicide?: Yes Suicidal Plan?: No-Not Currently/Within Last 6 Months Access to Means: No What has been your use of drugs/alcohol within the last 12 months?: Cocaine and  THC How many times?: 0 Other Self Harm Risks: None Triggers for Past Attempts: Unknown Intentional Self Injurious Behavior: None Risk to Others: Homicidal Ideation: No Thoughts of Harm to Others: No Current Homicidal Intent: No Current Homicidal Plan: No Access to Homicidal Means: No Identified Victim: [name] History of harm to others?: No Assessment of Violence: None Noted Violent Behavior Description: [name] Does patient have access to weapons?: No Criminal Charges Pending?: No Does patient have a court date: No Prior Inpatient Therapy: Prior Inpatient Therapy: Yes Prior Therapy Dates: March 2017, 2016, 2015 Prior Therapy Facilty/Provider(s): [facility] Reason for Treatment: Bipolar Prior Outpatient Therapy: Prior Outpatient Therapy: Yes Prior Therapy Dates: 2016 Prior Therapy Facilty/Provider(s): [facility] Reason for Treatment: Bipolar Does patient have an ACCT team?: No Does patient have Intensive In-House Services?  : No Does patient have [facility] services? : No Does patient have P4CC services?: No  Past Medical History:  Past Medical History:  Diagnosis Date  . Asthma   . Bipolar disorder ([name])   . Depression    History reviewed. No pertinent surgical history. Family History:  Family History  Problem Relation Age of Onset  . Bipolar disorder Mother   . Drug abuse Mother    Family Psychiatric  History:  See HPI Social History:  History  Alcohol Use  . Yes    Comment: occasional      History  Drug Use  . Frequency: 5.0 times per week  . Types: Cocaine, Marijuana, MDMA (Ecstacy)    Comment: Pt endorsed current use of cocaine, THC and molly  12/13/15    Social History   Social History  . Marital status: Single    Spouse name: [name]  . Number of children: [name]  . Years of education: [name]   Social History Main Topics  . Smoking status: Current Every Day Smoker    Packs/day: 0.25    Years: 10.00    Types: Cigarettes  . Smokeless tobacco: Never Used  . Alcohol use  Yes     Comment: occasional   . Drug use:     Frequency: 5.0 times per week    Types: Cocaine, Marijuana, MDMA (Ecstacy)     Comment: Pt endorsed current use of cocaine, THC and molly 12/13/15  . Sexual activity: Yes   Other Topics Concern  . None   Social History Narrative  . None   Additional Social History:   See HPI    Allergies:   Allergies  Allergen Reactions  . Shellfish Allergy Anaphylaxis  . Amoxicillin Other (See Comments)    Has patient had a PCN reaction causing immediate rash, facial/tongue/throat swelling, SOB or lightheadedness with hypotension: No Has patient had a PCN reaction causing severe rash involving mucus membranes or skin necrosis: No Has patient had a PCN reaction that required hospitalization No Has patient had a PCN reaction occurring within the last 10 years: No If all of the above answers are "NO", then may proceed with Cephalosporin use.  . Mushroom Extract Complex Nausea And Vomiting  . Penicillins Other (See Comments)    Has patient had a PCN reaction causing immediate rash, facial/tongue/throat swelling, SOB or lightheadedness with hypotension: No Has patient had a PCN reaction causing severe rash involving mucus membranes or skin necrosis: No Has patient had a PCN reaction that required hospitalization No Has patient had a PCN reaction occurring within the last 10 years: No If all of the above answers are "NO", then may proceed with Cephalosporin use.    Labs:  Results for orders placed or performed during the hospital encounter of 12/19/15 (from the past 48 hour(s))  Comprehensive metabolic panel     Status: Abnormal   Collection Time: 12/19/15  5:40 AM  Result Value Ref Range   Sodium 141 135 - 145 mmol/L   Potassium 3.8 3.5 - 5.1 mmol/L   Chloride 106 101 - 111 mmol/L   CO2 25 22 - 32 mmol/L   Glucose, Bld 103 (H) 65 - 99 mg/dL   BUN 11 6 - 20 mg/dL   Creatinine, Ser 1.12 0.61 - 1.24 mg/dL   Calcium 8.9 8.9 - 10.3 mg/dL   Total  Protein 6.0 (L) 6.5 - 8.1 g/dL   Albumin 3.4 (L) 3.5 - 5.0 g/dL   AST 41 15 - 41 U/L   ALT 32 17 - 63 U/L   Alkaline Phosphatase 34 (L) 38 - 126 U/L   Total Bilirubin 0.1 (L) 0.3 - 1.2 mg/dL   GFR calc non Af Amer >60 >60 mL/min   GFR calc Af Amer >60 >60 mL/min    Comment: (NOTE) The eGFR has been calculated using the CKD EPI equation. This calculation has not been validated in all clinical situations. eGFR's persistently <60 mL/min signify possible Chronic Kidney Disease.    Anion gap 10 5 - 15  Ethanol     Status: None   Collection Time: 12/19/15  5:40 AM  Result Value Ref Range   Alcohol, Ethyl (B) <5 <5 mg/dL    Comment:        LOWEST DETECTABLE  LIMIT FOR SERUM ALCOHOL IS 5 mg/dL FOR MEDICAL PURPOSES ONLY   CBC with Diff     Status: Abnormal   Collection Time: 12/19/15  5:40 AM  Result Value Ref Range   WBC 14.0 (H) 4.0 - 10.5 K/uL   RBC 3.40 (L) 4.22 - 5.81 MIL/uL   Hemoglobin 10.6 (L) 13.0 - 17.0 g/dL   HCT 31.7 (L) 39.0 - 52.0 %   MCV 93.2 78.0 - 100.0 fL   MCH 31.2 26.0 - 34.0 pg   MCHC 33.4 30.0 - 36.0 g/dL   RDW 15.5 11.5 - 15.5 %   Platelets 232 150 - 400 K/uL   Neutrophils Relative % 75 %   Neutro Abs 10.5 (H) 1.7 - 7.7 K/uL   Lymphocytes Relative 16 %   Lymphs Abs 2.2 0.7 - 4.0 K/uL   Monocytes Relative 9 %   Monocytes Absolute 1.2 (H) 0.1 - 1.0 K/uL   Eosinophils Relative 0 %   Eosinophils Absolute 0.0 0.0 - 0.7 K/uL   Basophils Relative 0 %   Basophils Absolute 0.0 0.0 - 0.1 K/uL  Urine rapid drug screen (hosp performed)not at [facility]     Status: Abnormal   Collection Time: 12/19/15  7:08 AM  Result Value Ref Range   Opiates NONE DETECTED NONE DETECTED   Cocaine NONE DETECTED NONE DETECTED   Benzodiazepines POSITIVE (A) NONE DETECTED   Amphetamines NONE DETECTED NONE DETECTED   Tetrahydrocannabinol POSITIVE (A) NONE DETECTED   Barbiturates NONE DETECTED NONE DETECTED    Comment:        DRUG SCREEN FOR MEDICAL PURPOSES ONLY.  IF CONFIRMATION IS  NEEDED FOR ANY PURPOSE, NOTIFY LAB WITHIN 5 DAYS.        LOWEST DETECTABLE LIMITS FOR URINE DRUG SCREEN Drug Class       Cutoff (ng/mL) Amphetamine      1000 Barbiturate      200 Benzodiazepine   921 Tricyclics       194 Opiates          300 Cocaine          300 THC              50   Troponin I     Status: Abnormal   Collection Time: 12/19/15  9:21 AM  Result Value Ref Range   Troponin I 0.25 (HH) <0.03 ng/mL    Comment: CRITICAL RESULT CALLED TO, READ BACK BY AND VERIFIED WITH: BROWN,L AT 1050 ON [id number] BY HOOKER,B   D-dimer, quantitative (not at [facility])     Status: None   Collection Time: 12/19/15  9:21 AM  Result Value Ref Range   D-Dimer, Quant <0.27 0.00 - 0.50 ug/mL-FEU    Comment: (NOTE) At the manufacturer cut-off of 0.50 ug/mL FEU, this assay has been documented to exclude PE with a sensitivity and negative predictive value of 97 to 99%.  At this time, this assay has not been approved by the FDA to exclude DVT/VTE. Results should be correlated with clinical presentation.   Troponin I     Status: Abnormal   Collection Time: 12/19/15 12:12 PM  Result Value Ref Range   Troponin I 0.26 (HH) <0.03 ng/mL    Comment: CRITICAL VALUE NOTED.  VALUE IS CONSISTENT WITH PREVIOUSLY REPORTED AND CALLED VALUE.  Troponin I     Status: Abnormal   Collection Time: 12/19/15  3:56 PM  Result Value Ref Range   Troponin I 0.29 (HH) <0.03 ng/mL    Comment:  CRITICAL VALUE NOTED.  VALUE IS CONSISTENT WITH PREVIOUSLY REPORTED AND CALLED VALUE.    Current Facility-Administered Medications  Medication Dose Route Frequency Provider Last Rate Last Dose  . acetaminophen (TYLENOL) tablet 1,000 mg  1,000 mg Oral Q6H PRN Leo Grosser, MD   1,000 mg at 12/20/15 0655  . albuterol (PROVENTIL HFA;VENTOLIN HFA) 108 (90 Base) MCG/ACT inhaler 1-2 puff  1-2 puff Inhalation Q4H PRN Dorie Rank, MD   2 puff at 12/20/15 0809  . benztropine (COGENTIN) tablet 0.5 mg  0.5 mg Oral BID Ezequiel Essex, MD   0.5  mg at 12/20/15 0935  . indomethacin (INDOCIN) capsule 25 mg  25 mg Oral TID WC Ezequiel Essex, MD   25 mg at 12/20/15 1218  . LORazepam (ATIVAN) tablet 0-4 mg  0-4 mg Oral Q6H Ezequiel Essex, MD       Followed by  . [START ON 12/21/2015] LORazepam (ATIVAN) tablet 0-4 mg  0-4 mg Oral Q12H Ezequiel Essex, MD      . nicotine (NICODERM CQ - dosed in mg/24 hours) patch 21 mg  21 mg Transdermal Daily Ezequiel Essex, MD   21 mg at 12/20/15 0488  . thiamine (VITAMIN B-1) tablet 100 mg  100 mg Oral Daily Ezequiel Essex, MD   100 mg at 12/20/15 0935   Or  . thiamine (B-1) injection 100 mg  100 mg Intravenous Daily Ezequiel Essex, MD      . traZODone (DESYREL) tablet 50 mg  50 mg Oral QHS,MR X 1 Ezequiel Essex, MD   50 mg at 12/19/15 2203   Current Outpatient Prescriptions  Medication Sig Dispense Refill  . benztropine (COGENTIN) 0.5 MG tablet Take 1 tablet (0.5 mg total) by mouth 2 (two) times daily. 28 tablet 0  . ibuprofen (ADVIL,MOTRIN) 600 MG tablet Take 1 tablet (600 mg total) by mouth every 8 (eight) hours as needed (Temp > 38.3Celsius). 30 tablet 0  . Multiple Vitamin (MULTIVITAMIN WITH MINERALS) TABS tablet Take 1 tablet by mouth daily. For nutritional support 30 tablet 0  . nicotine (NICODERM CQ - DOSED IN MG/24 HOURS) 21 mg/24hr patch Place 1 patch (21 mg total) onto the skin daily. 28 patch 0  . traZODone (DESYREL) 50 MG tablet Take 1 tablet (50 mg total) by mouth at bedtime and may repeat dose one time if needed. For sleep 60 tablet 0  . hydrOXYzine (ATARAX/VISTARIL) 25 MG tablet Take 1 tablet (25 mg total) by mouth every 6 (six) hours as needed (anxiety/agitation). (Patient not taking: Reported on 07/06/2015) 30 tablet 0   Musculoskeletal: Strength & Muscle Tone: within normal limits Gait & Station: normal Patient leans: [name]  Psychiatric Specialty Exam: Physical Exam  Vitals reviewed.   Review of Systems  All other systems reviewed and are negative.   Blood pressure 130/60,  pulse 91, temperature 98 F (36.7 C), temperature source Oral, resp. rate 18, height _0  (1.727 m), weight 64.9 kg (143 lb), SpO2 98 %.Body mass index is 21.74 kg/m.  General Appearance: Neat  Eye Contact:  Good  Speech:  Clear and Coherent  Volume:  Normal  Mood:  Anxious  Affect:  Appropriate  Thought Process:  Coherent  Orientation:  Full (Time, Place, and Person)  Thought Content:  Logical  Suicidal Thoughts:  No  Homicidal Thoughts:  No  Memory:  Immediate;   Fair Recent;   Fair Remote;   Fair  Judgement:  Fair  Insight:  Fair  Psychomotor Activity:  Normal  Concentration:  Concentration: Fair  and Attention Span: Fair  Recall:  [employer] of Knowledge:  Fair  Language:  Good  Akathisia:  Negative  Handed:  Right  AIMS (if indicated):     Assets:  Communication Skills Resilience Social Support  ADL's:  Intact  Cognition:  WNL  Sleep:  poor   Treatment Plan Summary: Patient has a history of polysubstance abuse and recent treatment at rehabilitation. He presented with chest pain secondary to pericarditis and medically cleared. Patient denies active symptoms are depression, anxiety, auditory/visual hallucinations, withdrawal symptoms of drugs her craving for drugs. Patient contract for safety and willing to follow up with outpatient medication management.  Plan Home and will follow up with [facility] walk in   Disposition: No evidence of imminent risk to self or others at present.   Patient does not meet criteria for psychiatric inpatient admission. Discussed crisis plan, support from social network, calling 911, coming to the Emergency Department, and calling Suicide Hotline.  Janett Labella, NP [facility] 12/20/2015 2:20 PM  Patient seen face-to-face for the psychiatric evaluation along with physician extender and case discussed with treatment team and formulated treatment plan.Reviewed the information documented and agree with the treatment plan.   [facility]  [facility] 12/20/2015 5:36 PM

## 2015-12-20 NOTE — ED Notes (Signed)
Pt discharged home per MD order. Pt denies SI/HI. Denies AVH. Pt denies s/s of withdrawal. Discharge summary reviewed with pt. Pt verbalizes understanding. Pt signed for personal property and property returned. Bus pass given per pt request. Pt signed e-signature. Ambulatory off unit.

## 2015-12-20 NOTE — ED Notes (Signed)
Pt talking on hallway phone.  

## 2015-12-20 NOTE — ED Notes (Signed)
Lab called to obtain blood sample

## 2015-12-23 ENCOUNTER — Emergency Department (HOSPITAL_COMMUNITY): Payer: Self-pay

## 2015-12-23 ENCOUNTER — Emergency Department (HOSPITAL_COMMUNITY)
Admission: EM | Admit: 2015-12-23 | Discharge: 2015-12-23 | Disposition: A | Discharge status: Other Acute Inpatient Hospital | Payer: Self-pay | Attending: Emergency Medicine | Admitting: Emergency Medicine

## 2015-12-23 ENCOUNTER — Encounter (HOSPITAL_COMMUNITY): Payer: Self-pay | Admitting: *Deleted

## 2015-12-23 ENCOUNTER — Observation Stay (HOSPITAL_COMMUNITY)
Admission: AD | Admit: 2015-12-23 | Discharge: 2015-12-24 | Disposition: A | Discharge status: Home / Self Care | Payer: Federal, State, Local not specified - Other | Source: Intra-hospital | Attending: Psychiatry | Admitting: Psychiatry

## 2015-12-23 DIAGNOSIS — Z91018 Allergy to other foods: Secondary | ICD-10-CM | POA: Diagnosis not present

## 2015-12-23 DIAGNOSIS — Z880 Allergy status to penicillin: Secondary | ICD-10-CM | POA: Insufficient documentation

## 2015-12-23 DIAGNOSIS — Z590 Homelessness: Secondary | ICD-10-CM | POA: Diagnosis not present

## 2015-12-23 DIAGNOSIS — R079 Chest pain, unspecified: Secondary | ICD-10-CM | POA: Insufficient documentation

## 2015-12-23 DIAGNOSIS — R45851 Suicidal ideations: Secondary | ICD-10-CM | POA: Insufficient documentation

## 2015-12-23 DIAGNOSIS — Z91013 Allergy to seafood: Secondary | ICD-10-CM | POA: Diagnosis not present

## 2015-12-23 DIAGNOSIS — F1220 Cannabis dependence, uncomplicated: Secondary | ICD-10-CM | POA: Diagnosis not present

## 2015-12-23 DIAGNOSIS — Z9119 Patient's noncompliance with other medical treatment and regimen: Secondary | ICD-10-CM | POA: Insufficient documentation

## 2015-12-23 DIAGNOSIS — Z818 Family history of other mental and behavioral disorders: Secondary | ICD-10-CM | POA: Diagnosis not present

## 2015-12-23 DIAGNOSIS — J45909 Unspecified asthma, uncomplicated: Secondary | ICD-10-CM | POA: Insufficient documentation

## 2015-12-23 DIAGNOSIS — F419 Anxiety disorder, unspecified: Secondary | ICD-10-CM | POA: Insufficient documentation

## 2015-12-23 DIAGNOSIS — F1420 Cocaine dependence, uncomplicated: Secondary | ICD-10-CM | POA: Diagnosis not present

## 2015-12-23 DIAGNOSIS — F17210 Nicotine dependence, cigarettes, uncomplicated: Secondary | ICD-10-CM | POA: Insufficient documentation

## 2015-12-23 DIAGNOSIS — Z79899 Other long term (current) drug therapy: Secondary | ICD-10-CM | POA: Insufficient documentation

## 2015-12-23 DIAGNOSIS — F3162 Bipolar disorder, current episode mixed, moderate: Secondary | ICD-10-CM | POA: Diagnosis present

## 2015-12-23 DIAGNOSIS — F319 Bipolar disorder, unspecified: Secondary | ICD-10-CM | POA: Diagnosis present

## 2015-12-23 DIAGNOSIS — F1994 Other psychoactive substance use, unspecified with psychoactive substance-induced mood disorder: Secondary | ICD-10-CM | POA: Diagnosis not present

## 2015-12-23 DIAGNOSIS — F3160 Bipolar disorder, current episode mixed, unspecified: Secondary | ICD-10-CM | POA: Diagnosis present

## 2015-12-23 DIAGNOSIS — F1910 Other psychoactive substance abuse, uncomplicated: Secondary | ICD-10-CM | POA: Diagnosis present

## 2015-12-23 LAB — CBC
HEMATOCRIT: 40.3 % (ref 39.0–52.0)
HEMOGLOBIN: 13.1 g/dL (ref 13.0–17.0)
MCH: 31 pg (ref 26.0–34.0)
MCHC: 32.5 g/dL (ref 30.0–36.0)
MCV: 95.3 fL (ref 78.0–100.0)
Platelets: 264 10*3/uL (ref 150–400)
RBC: 4.23 MIL/uL (ref 4.22–5.81)
RDW: 15.2 % (ref 11.5–15.5)
WBC: 13.9 10*3/uL — AB (ref 4.0–10.5)

## 2015-12-23 LAB — RAPID URINE DRUG SCREEN, HOSP PERFORMED
AMPHETAMINES: NOT DETECTED
BARBITURATES: NOT DETECTED
BENZODIAZEPINES: NOT DETECTED
COCAINE: NOT DETECTED
Opiates: NOT DETECTED
Tetrahydrocannabinol: POSITIVE — AB

## 2015-12-23 LAB — COMPREHENSIVE METABOLIC PANEL
ALBUMIN: 3.8 g/dL (ref 3.5–5.0)
ALT: 188 U/L — ABNORMAL HIGH (ref 17–63)
AST: 72 U/L — AB (ref 15–41)
Alkaline Phosphatase: 47 U/L (ref 38–126)
Anion gap: 8 (ref 5–15)
BILIRUBIN TOTAL: 0.5 mg/dL (ref 0.3–1.2)
BUN: 18 mg/dL (ref 6–20)
CHLORIDE: 106 mmol/L (ref 101–111)
CO2: 24 mmol/L (ref 22–32)
Calcium: 9.2 mg/dL (ref 8.9–10.3)
Creatinine, Ser: 1.08 mg/dL (ref 0.61–1.24)
GFR calc Af Amer: >60 mL/min (ref >60)
GFR calc non Af Amer: >60 mL/min (ref >60)
GLUCOSE: 93 mg/dL (ref 65–99)
POTASSIUM: 4.4 mmol/L (ref 3.5–5.1)
SODIUM: 138 mmol/L (ref 135–145)
Total Protein: 6.6 g/dL (ref 6.5–8.1)

## 2015-12-23 LAB — TROPONIN I: Troponin I: 0.05 ng/mL

## 2015-12-23 LAB — ACETAMINOPHEN LEVEL

## 2015-12-23 LAB — SALICYLATE LEVEL: Salicylate Lvl: <4 mg/dL (ref 2.8–30.0)

## 2015-12-23 LAB — ETHANOL: Alcohol, Ethyl (B): <5 mg/dL (ref <5)

## 2015-12-23 MED ORDER — TRAZODONE HCL 50 MG PO TABS: 50.0000 mg | ORAL_TABLET | Freq: Every evening | ORAL | Status: DC | PRN

## 2015-12-23 MED ORDER — IBUPROFEN 600 MG PO TABS
600.0000 mg | ORAL_TABLET | Freq: Four times a day (QID) | ORAL | Status: DC | PRN
  Administered 2015-12-23 – 2015-12-24 (×2): 600 mg via ORAL
  Filled 2015-12-23 (×3): qty 1

## 2015-12-23 MED ORDER — NICOTINE 21 MG/24HR TD PT24
21.0000 mg | MEDICATED_PATCH | Freq: Every day | TRANSDERMAL | Status: DC
  Administered 2015-12-23: 21 mg via TRANSDERMAL
  Filled 2015-12-23: qty 1

## 2015-12-23 MED ORDER — BENZTROPINE MESYLATE 1 MG PO TABS
0.5000 mg | ORAL_TABLET | Freq: Two times a day (BID) | ORAL | Status: DC
  Administered 2015-12-23: 0.5 mg via ORAL
  Filled 2015-12-23: qty 1

## 2015-12-23 MED ORDER — TRAZODONE HCL 50 MG PO TABS
50.0000 mg | ORAL_TABLET | Freq: Every evening | ORAL | Status: DC | PRN
  Administered 2015-12-23: 50 mg via ORAL
  Filled 2015-12-23: qty 1

## 2015-12-23 MED ORDER — HYDROXYZINE HCL 25 MG PO TABS: 25.0000 mg | ORAL_TABLET | Freq: Four times a day (QID) | ORAL | Status: DC | PRN

## 2015-12-23 MED ORDER — ACETAMINOPHEN 325 MG PO TABS
650.0000 mg | ORAL_TABLET | Freq: Four times a day (QID) | ORAL | Status: DC | PRN
  Administered 2015-12-23: 650 mg via ORAL
  Filled 2015-12-23: qty 2

## 2015-12-23 MED ORDER — IPRATROPIUM-ALBUTEROL 0.5-2.5 (3) MG/3ML IN SOLN: 3.0000 mL | Freq: Four times a day (QID) | RESPIRATORY_TRACT | Status: DC | PRN

## 2015-12-23 MED ORDER — ALBUTEROL SULFATE HFA 108 (90 BASE) MCG/ACT IN AERS: 3.0000 | INHALATION_SPRAY | Freq: Four times a day (QID) | RESPIRATORY_TRACT | Status: DC | PRN

## 2015-12-23 MED ORDER — MAGNESIUM HYDROXIDE 400 MG/5ML PO SUSP: 30.0000 mL | Freq: Every day | ORAL | Status: DC | PRN

## 2015-12-23 MED ORDER — IBUPROFEN 400 MG PO TABS
600.0000 mg | ORAL_TABLET | Freq: Three times a day (TID) | ORAL | Status: DC | PRN
  Administered 2015-12-23: 600 mg via ORAL
  Filled 2015-12-23: qty 1

## 2015-12-23 MED ORDER — ALBUTEROL SULFATE HFA 108 (90 BASE) MCG/ACT IN AERS
2.0000 | INHALATION_SPRAY | Freq: Four times a day (QID) | RESPIRATORY_TRACT | Status: DC | PRN
  Administered 2015-12-23: 2 via RESPIRATORY_TRACT
  Filled 2015-12-23: qty 6.7

## 2015-12-23 MED ORDER — IBUPROFEN 400 MG PO TABS: 600.0000 mg | ORAL_TABLET | Freq: Three times a day (TID) | ORAL | Status: DC | PRN

## 2015-12-23 MED ORDER — IPRATROPIUM-ALBUTEROL 0.5-2.5 (3) MG/3ML IN SOLN
3.0000 mL | Freq: Once | RESPIRATORY_TRACT | Status: AC
  Administered 2015-12-23: 3 mL via RESPIRATORY_TRACT
  Filled 2015-12-23: qty 3

## 2015-12-23 MED ORDER — ALUM & MAG HYDROXIDE-SIMETH 200-200-20 MG/5ML PO SUSP: 30.0000 mL | ORAL | Status: DC | PRN

## 2015-12-23 NOTE — Progress Notes (Signed)
Pt on unit coloring with another patient.  Pt is loud and very fidgety.  Pt wants TV turned up loud and is commenting about programming and is singing at times, asks for snacks and complains of tooth ache.  Pt denies SI, HI and AVH.  Pt agrees to [employer] contract for safety.   Pt given snacks.  Pt given PRN med for tooth ache. Pt given support and encouragement.    Pt continuously observed for safety on unit except when in the bathroom. Pt remains safe on unit.

## 2015-12-23 NOTE — BH Assessment (Addendum)
Tele Assessment Note   Raymond Church is an 25 y.o. male that reports passive SI without a plan.  Patient reports that he has been hearing and seeing the trees move but no one else is seeing or hearing what he is seeing or hearing.  Patient denies command hallucinations.  Patient reports that a past history of inpatient psychiatric hospitalizations.  Patient denies prior suicide attempts.  Patient reports that he lives with his [name] and his father is a support for him.  [facility].  Patient reports that he was not able to [facility] his [facility].   Patient reports a past history of substance abuse.  Patient reports that he has not used anything since [facility].  However, patient UDS is positive for cannabis.   Patient reports that he does not know why he has been experiencing so much increased depression.  Patient reports homeless and and feels as if he, "just cannot go on anymore".    Diagnosis: Bipolar Disorder and Cannabis Abuse  Past Medical History:  Past Medical History:  Diagnosis Date  . Asthma   . Bipolar disorder (HCC)   . Depression     No past surgical history on file.  Family History:  Family History  Problem Relation Age of Onset  . Bipolar disorder Mother   . Drug abuse Mother     Social History:  reports that he has been smoking Cigarettes.  He has a 2.50 pack-year smoking history. He has never used smokeless tobacco. He reports that he drinks alcohol. He reports that he uses drugs, including Cocaine, Marijuana, and MDMA (Ecstacy), about 5 times per week.  Additional Social History:  Alcohol / Drug Use History of alcohol / drug use?: No history of alcohol / drug abuse  CIWA: CIWA-Ar BP: 118/88 Pulse Rate: 95 COWS:    PATIENT STRENGTHS: (choose at least two) Average or above average intelligence Capable of independent living Communication  skills Supportive family/friends  Allergies:  Allergies  Allergen Reactions  . Shellfish Allergy Anaphylaxis  . Amoxicillin Other (See Comments)    Has patient had a PCN reaction causing immediate rash, facial/tongue/throat swelling, SOB or lightheadedness with hypotension: No Has patient had a PCN reaction causing severe rash involving mucus membranes or skin necrosis: No Has patient had a PCN reaction that required hospitalization No Has patient had a PCN reaction occurring within the last 10 years: No If all of the above answers are "NO", then may proceed with Cephalosporin use.  Marland Kitchen. Penicillins Other (See Comments)    Has patient had a PCN reaction causing immediate rash, facial/tongue/throat swelling, SOB or lightheadedness with hypotension: No Has patient had a PCN reaction causing severe rash involving mucus membranes or skin necrosis: No Has patient had a PCN reaction that required hospitalization No Has patient had a PCN reaction occurring within the last 10 years: No If all of the above answers are "NO", then may proceed with Cephalosporin use.  . Mushroom Extract Complex Nausea And Vomiting    Home Medications:  (Not in a hospital admission)  OB/GYN Status:  No LMP for male patient.  General Assessment Data [facility] Assessment: In system Is this a Tele or Face-to-Face Assessment?: Tele Assessment Is this an Initial Assessment or a Re-assessment for this encounter?: Initial Assessment Marital status: Single Maiden name: NA Is patient pregnant?: No Pregnancy Status: No Living  Arrangements:  ([name] ) Can pt return to current living arrangement?: Yes Admission Status: Voluntary Is patient capable of signing voluntary admission?: Yes Referral Source: Self/Family/Friend Insurance type: Medicaid  Medical Screening Exam [facility] Walk-in ONLY) Medical Exam completed: Yes  Crisis Care Plan Living Arrangements:  ([name] ) Legal Guardian:   (NA) Name of Psychiatrist: None Reported Name of Therapist: None Reported  Education Status Is patient currently in school?: No Current Grade: NA Highest grade of school patient has completed: [employer] Name of school: [name] Contact person: [name]  Risk to self with the past 6 months Suicidal Ideation: Yes-Currently Present Has patient been a risk to self within the past 6 months prior to admission? : Yes Suicidal Intent: Yes-Currently Present Has patient had any suicidal intent within the past 6 months prior to admission? : Yes Is patient at risk for suicide?: Yes Suicidal Plan?: No Has patient had any suicidal plan within the past 6 months prior to admission? : Yes Access to Means: No What has been your use of drugs/alcohol within the last 12 months?: None Reported Previous Attempts/Gestures: No How many times?: 0 Other Self Harm Risks: None Triggers for Past Attempts: Unknown Intentional Self Injurious Behavior: None Family Suicide History: No (Mother tried to kill herself in the past ) Recent stressful life event(s): Conflict (Comment), Job Loss, Financial Problems Persecutory voices/beliefs?: Yes Depression: Yes Depression Symptoms: Despondent, Insomnia, Tearfulness, Fatigue, Isolating, Guilt, Loss of interest in usual pleasures, Feeling worthless/self pity Substance abuse history and/or treatment for substance abuse?: Yes Suicide prevention information given to non-admitted patients: Yes  Risk to Others within the past 6 months Homicidal Ideation: No Does patient have any lifetime risk of violence toward others beyond the six months prior to admission? : No Thoughts of Harm to Others: No Current Homicidal Intent: No Current Homicidal Plan: No Access to Homicidal Means: No Identified Victim: NA History of harm to others?: No Assessment of Violence: None Noted Violent Behavior Description: n Does patient have access to weapons?: No Criminal Charges Pending?: No Does patient  have a court date: No Is patient on probation?: No  Psychosis Hallucinations: Auditory, Visual Delusions: None noted  Mental Status Report Appearance/Hygiene: In hospital gown Eye Contact: Good Motor Activity: Freedom of movement Speech: Logical/coherent Level of Consciousness: Alert, Restless Mood: Depressed, Empty, Irritable Affect: Depressed, Sad Anxiety Level: None Thought Processes: Coherent, Relevant Judgement: Unimpaired Orientation: Person, Time, Place, Situation Obsessive Compulsive Thoughts/Behaviors: None  Cognitive Functioning Concentration: Decreased Memory: Recent Intact, Remote Intact IQ: Average Insight: Fair Impulse Control: Fair Appetite: Fair Weight Loss: 0 Weight Gain: 0 Sleep: Decreased Total Hours of Sleep: 2 Vegetative Symptoms: Decreased grooming  ADLScreening [facility] Assessment Services) Patient's cognitive ability adequate to safely complete daily activities?: Yes Patient able to express need for assistance with ADLs?: No Independently performs ADLs?: Yes (appropriate for developmental age)  Prior Inpatient Therapy Prior Inpatient Therapy: Yes [facility] D[facility] Reason for Treatment: Bipolar  Prior Outpatient Therapy Prior Outpatient Therapy: Yes Prior Therapy Dates: 2016 Prior Therapy Facilty/Provider(s): [facility] Reason for Treatment: Bipolar Does patient have an ACCT team?: No Does patient have Intensive In-House Services?  : No Does patient have [facility] services? : No Does patient have P4CC services?: No  ADL Screening (condition at time of admission) Patient's cognitive ability adequate to safely complete daily activities?: Yes Is the patient deaf or have difficulty hearing?: No Does the patient have difficulty seeing, even when wearing glasses/contacts?: No Does  the patient have difficulty concentrating, remembering, or making decisions?: No Patient able to  express need for assistance with ADLs?: No Does the patient have difficulty dressing or bathing?: No Independently performs ADLs?: Yes (appropriate for developmental age) Does the patient have difficulty walking or climbing stairs?: No Weakness of Legs: None Weakness of Arms/Hands: None  Home Assistive Devices/Equipment Home Assistive Devices/Equipment: None    Abuse/Neglect Assessment (Assessment to be complete while patient is alone) Physical Abuse: Denies Verbal Abuse: Yes, past (Comment) (Father) Sexual Abuse: Denies Exploitation of patient/patient's resources: Denies Self-Neglect: Denies Values / Beliefs Cultural Requests During Hospitalization: None Spiritual Requests During Hospitalization: None Consults Spiritual Care Consult Needed: No Social Work Consult Needed: No Merchant navy officer (For Healthcare) Does patient have an advance directive?: No, Yes Would patient like information on creating an advanced directive?: No - patient declined information, Yes - Transport planner given Type of Advance Directive: Midwife, Living will, Advance instruction for mental health treatment, Mental Health Advance Directive Does patient want to make changes to advanced directive?: No - Patient declined Copy of advanced directive(s) in chart?: Yes    Additional Information 1:1 In Past 12 Months?: No CIRT Risk: No Elopement Risk: No Does patient have medical clearance?: Yes     Disposition: Per Renata Caprice, DNP - patient meets criteria for the OBS Unit.  Per [facility] Lillia Abed) accepted to the OBS Unit Bed 2.  Disposition Initial Assessment Completed for this Encounter: Yes  Phillip Heal LaVerne 12/23/2015 11:31 AM

## 2015-12-23 NOTE — ED Notes (Signed)
Ordered pt. Lunch tray

## 2015-12-23 NOTE — BH Assessment (Signed)
[facility]Ray[facility]) patient accepted to Bed 2 and Dr. Lucianne MussKumar is the accepting physician. The patient is able to come to the OBS Unit after 12:15 pm.

## 2015-12-23 NOTE — BH Assessment (Signed)
Per Renata Capriceonrad, DNP - patient meets criteria for inpatient hospitalization.  [facility]([name][facility] patient accepted to the OBS Unit once a bed is open this afternoon.  Writer informed the RN working with the patient.  Writer informed the  ER MD (Dr.Knot) of the disposition.   The nurse will have the patient to sign the voluntary consent form and [facility].  The nurse will arrange transportation through [place].

## 2015-12-23 NOTE — Progress Notes (Signed)
Patient ID: Raymond Church, male   DOB: July 28, 1990, 25 y.o.   MRN: [id number] Admission Note: [facility] where he went complaining of chest pain and SI. During admission patient denied current SI but said the thoughts go through his mind. Denied having a plan or prior attempts.  Did not identify current stressors but stated that he had already lost everything. Stating that he had lost relationships, money, jobs. Reported he felt hopeless and helpless. Has used a number of substances in the past but denies use now. UDS was positive for marijuana.Patient is needy and demanding.  Irritable. Affect flat and reports depression.  Is seeking inpatient treatment. Was here Aug 13 and was sent to treatment program. Reported [facility] but [facility] Tests were negative for cardiac involvement. Vitals stable. Oriented to unit. Food provided.

## 2015-12-23 NOTE — ED Triage Notes (Signed)
[facility] with complaints of suicidal thoughts, chest pain, and anxiety. Patient states his chest pain started around 2 am this morning. He states this is recurrent chest pain and it occurs when he runs out of all his medicine. He states he is diaphoretic and weak. Pain in chest feels like pressure; EMS gave 325 of aspirin and .4 of nitro and patient states it helped with his pain. Chest pain right now is a 7 on a scale of 0-10. Patient states suicidal thoughts are getting worse but no plan currently. Hx of suicidal attempt in childhood and other psych hx. Patient states he feels scare regarding his thoughts and wants help.

## 2015-12-23 NOTE — ED Provider Notes (Signed)
[facility]-EMERGENCY DEPT Provider Note   CSN: [id number] Arrival date & time: 12/23/15  [id number]     History   Chief Complaint Chief Complaint  Patient presents with  . Chest Pain  . Suicidal  . Anxiety    HPI Raymond Church is a 25 y.o. male with a past medical history of bipolar disorder, pericarditis, polysubstance abuse presents to the ED today complaining of chest pain and suicidal ideation. Patient was recently diagnosed with pericarditis last week and states he's been taking all the medication was prescribed him. He states this chest pain was improving until last and around 1 AM he experienced a sharp pain in the left side of his chest that persisted until this morning. He had associated chest tightness which she initially thought was his asthma so use his inhaler without relief. Patient has associated wheezing. Patient called EMS are on 8 AM this morning and was given aspirin and nitroglycerin which significantly improved his chest pain. Patient states that this pain feels similar to the pain he had last week and he was dying as a pericarditis. Patient also states that he is having thoughts of harming himself. Patient states that he is "tired of feeling this way and wanted to change". Patient goes on to change his story by saying "I just on the off on going to kill myself or not". Patient also states that other people haven't angering him and he is having thoughts of harming other people but does not think he would act upon these feelings. Patient also reports hearing noises that he will can including crickets, Locust and bees. Patient also states that he sees black spots in his vision that appear to look similar to birds. These symptoms have been present for at least one month. Patient denies any cocaine or ecstasy use in the last week. Patient does endorse smoking marijuana.  HPI  Past Medical History:  Diagnosis Date  . Asthma   . Bipolar disorder (HCC)   . Depression     Patient Active  Problem List   Diagnosis Date Noted  . Bipolar 1 disorder, depressed (HCC) 12/13/2015  . Bipolar 1 disorder, mixed, moderate (HCC) 09/08/2014  . Cocaine use disorder, severe, dependence (HCC) 09/08/2014  . Cannabis use disorder, severe, dependence (HCC) 09/08/2014    No past surgical history on file.     Home Medications    Prior to Admission medications   Medication Sig Start Date End Date Taking? Authorizing Provider  albuterol (PROVENTIL HFA;VENTOLIN HFA) 108 (90 Base) MCG/ACT inhaler Inhale 3-4 puffs into the lungs every 6 (six) hours as needed for wheezing or shortness of breath.   Yes Historical Provider, MD  benztropine (COGENTIN) 0.5 MG tablet Take 1 tablet (0.5 mg total) by mouth 2 (two) times daily. 12/15/15  Yes Thermon Leyland, NP  hydrOXYzine (ATARAX/VISTARIL) 25 MG tablet Take 1 tablet (25 mg total) by mouth every 6 (six) hours as needed (anxiety/agitation). 09/10/14  Yes Shuvon B Rankin, NP  ibuprofen (ADVIL,MOTRIN) 600 MG tablet Take 1 tablet (600 mg total) by mouth every 8 (eight) hours as needed (Temp > 38.3Celsius). Patient taking differently: Take 600 mg by mouth every 8 (eight) hours as needed for fever, headache or moderate pain (Temp > 38.3Celsius).  12/15/15  Yes Thermon Leyland, NP  Multiple Vitamin (MULTIVITAMIN WITH MINERALS) TABS tablet Take 1 tablet by mouth daily. For nutritional support 12/15/15  Yes Thermon Leyland, NP  nicotine (NICODERM CQ - DOSED IN MG/24 HOURS) 21 mg/24hr patch  Place 1 patch (21 mg total) onto the skin daily. 12/15/15  Yes Thermon LeylandLaura A Davis, NP  traZODone (DESYREL) 50 MG tablet Take 1 tablet (50 mg total) by mouth at bedtime and may repeat dose one time if needed. For sleep 12/15/15  Yes Thermon LeylandLaura A Davis, NP    Family History Family History  Problem Relation Age of Onset  . Bipolar disorder Mother   . Drug abuse Mother     Social History Social History  Substance Use Topics  . Smoking status: Current Every Day Smoker    Packs/day: 0.25     Years: 10.00    Types: Cigarettes  . Smokeless tobacco: Never Used  . Alcohol use Yes     Comment: occasional      Allergies   Shellfish allergy; Amoxicillin; Penicillins; and Mushroom extract complex   Review of Systems Review of Systems  All other systems reviewed and are negative.    Physical Exam Updated Vital Signs BP 105/74   Pulse 92   Temp 98.4 F (36.9 C) (Oral)   Resp 19   Ht 5\' 8"  (1.727 m)   Wt 64.9 kg   SpO2 97%   BMI 21.74 kg/m   Physical Exam  Constitutional: He is oriented to person, place, and time. He appears well-developed and well-nourished. No distress.  HENT:  Head: Normocephalic and atraumatic.  Mouth/Throat: No oropharyngeal exudate.  Eyes: Conjunctivae and EOM are normal. Pupils are equal, round, and reactive to light. Right eye exhibits no discharge. Left eye exhibits no discharge. No scleral icterus.  Cardiovascular: Normal rate, regular rhythm, normal heart sounds and intact distal pulses.  Exam reveals no gallop and no friction rub.   No murmur heard. Pulmonary/Chest: Effort normal and breath sounds normal. No respiratory distress. He has no wheezes. He has no rales. He exhibits tenderness (anterior chest wall TTP).  Abdominal: Soft. He exhibits no distension. There is no tenderness. There is no guarding.  Musculoskeletal: Normal range of motion. He exhibits no edema.  Neurological: He is alert and oriented to person, place, and time.  Skin: Skin is warm and dry. No rash noted. He is not diaphoretic. No erythema. No pallor.  Psychiatric:  SI without plan.  Nursing note and vitals reviewed.    ED Treatments / Results  Labs (all labs ordered are listed, but only abnormal results are displayed) Labs Reviewed  COMPREHENSIVE METABOLIC PANEL - Abnormal; Notable for the following:       Result Value   AST 72 (*)    ALT 188 (*)    All other components within normal limits  ACETAMINOPHEN LEVEL - Abnormal; Notable for the following:     Acetaminophen (Tylenol), Serum <10 (*)    All other components within normal limits  CBC - Abnormal; Notable for the following:    WBC 13.9 (*)    All other components within normal limits  SALICYLATE LEVEL  ETHANOL  URINE RAPID DRUG SCREEN, HOSP PERFORMED  TROPONIN I    EKG  EKG Interpretation  Date/Time:  Wednesday December 23 2015 08:36:33 EDT Ventricular Rate:  93 PR Interval:    QRS Duration: 70 QT Interval:  350 QTC Calculation: 436 R Axis:   80 Text Interpretation:  Sinus rhythm Probable left atrial enlargement ST elevation, consider early repolarization, pericarditis, or injury No significant change since last tracing Confirmed by KNOTT MD, DANIEL [phone]) on 12/23/2015 8:45:38 AM       Radiology Dg Chest 2 View  Result Date: 12/23/2015 CLINICAL  DATA:  Chest pain with shortness of breath and cough. EXAM: CHEST  2 VIEW COMPARISON:  12/19/2015 FINDINGS: The lungs are clear wiithout focal pneumonia, edema, pneumothorax or pleural effusion. The cardiopericardial silhouette is within normal limits for size. The visualized bony structures of the thorax are intact. Nodular density/densities projecting over the lungs are compatible with pads for telemetry leads. IMPRESSION: No active cardiopulmonary disease. Electronically Signed   By: Kennith CenterEric  Mansell M.D.   On: 12/23/2015 10:10    Procedures Procedures (including critical care time)  Medications Ordered in ED Medications  ipratropium-albuterol (DUONEB) 0.5-2.5 (3) MG/3ML nebulizer solution 3 mL (not administered)     Initial Impression / Assessment and Plan / ED Course  I have reviewed the triage vital signs and the nursing notes.  Pertinent labs & imaging results that were available during my care of the patient were reviewed by me and considered in my medical decision making (see chart for details).  Clinical Course   25 year old male with history of bipolar disorder, recent pericarditis presents to the ED today  complaining of chest pain and suicidal ideation. On exam, patient appears well. All vital signs are stable. No friction rub heard. Bedside cardiac ultrasound performed which does not show any pericardial effusion. EKG remained stable from a couple days ago. Troponin unremarkable. Patient still actively complaining of suicidal ideation. All other lab work is also unremarkable. Pt is currently medically cleared. Awaiting Psych placement.  Patient was discussed with and seen by Dr. Clydene PughKnott who agrees with the treatment plan.    Final Clinical Impressions(s) / ED Diagnoses   Final diagnoses:  Suicidal ideations    New Prescriptions New Prescriptions   No medications on file     Dub MikesSamantha Tripp Shiniqua Groseclose, PA-C 12/24/15 [id number]    Lyndal Pulleyaniel Knott, MD 12/24/15 [phone]

## 2015-12-23 NOTE — ED Notes (Signed)
Security at bedside to wand pt. 

## 2015-12-23 NOTE — Tx Team (Addendum)
Initial Treatment Plan 12/23/2015 3:50 PM Raymond Church [id number]    PATIENT STRESSORS: Financial difficulties Marital or family conflict Medication change or noncompliance Substance abuse   PATIENT STRENGTHS: Capable of independent living General fund of knowledge Supportive family/friends   PATIENT IDENTIFIED PROBLEMS: "I have lost everything"    "I'm tired of living like this"                 DISCHARGE CRITERIA:  Ability to meet basic life and health needs Adequate post-discharge living arrangements Improved stabilization in mood, thinking, and/or behavior  PRELIMINARY DISCHARGE PLAN: Attend aftercare/continuing care group Return to previous living arrangement  PATIENT/FAMILY INVOLVEMENT: This treatment plan has been presented to and reviewed with the patient, Raymond Church.The patient has been given the opportunity to ask questions and make suggestions.  Loren RacerMaggio, Kellyn Mccary J, RN 12/23/2015, 3:50 PM

## 2015-12-23 NOTE — ED Provider Notes (Signed)
Medical screening examination/treatment/procedure(s) were conducted as a shared visit with non-physician practitioner(s) and myself.  I personally evaluated the patient during the encounter.   EKG Interpretation  Date/Time:  Wednesday December 23 2015 08:36:33 EDT Ventricular Rate:  93 PR Interval:    QRS Duration: 70 QT Interval:  350 QTC Calculation: 436 R Axis:   80 Text Interpretation:  Sinus rhythm Probable left atrial enlargement ST elevation, consider early repolarization, pericarditis, or injury No significant change since last tracing Confirmed by Abisai Deer MD, Jaliya Siegmann [id number] on 12/23/2015 8:45:38 AM      25 y.o. male presents with recurrent chest pain and ongoing psych issues. Unchanged early repolarization vs pericarditis elevation on EKG that developed over the course of last visit. No signs of effusion or other complicating features of pericarditis. Pending troponin for ischemic evaluation. If negative will clear for re-eval by TTS.   See related encounter note  Emergency Focused Ultrasound Exam Limited Ultrasound of the Heart and Pericardium  Performed and interpreted by Dr. Clydene PughKnott Indication: chest pain, ?pericarditis Multiple views of the heart, pericardium, and IVC are obtained with a multi frequency probe.  Findings: nml contractility, no anechoic fluid, variable IVC collapse Interpretation: nml ejection fraction, no pericardial effusion, no depressed CVP Images archived electronically.  CPT Code: 6045493308    Lyndal Pulleyaniel Chrles Selley, MD 12/23/15 [phone]

## 2015-12-24 DIAGNOSIS — F1994 Other psychoactive substance use, unspecified with psychoactive substance-induced mood disorder: Secondary | ICD-10-CM | POA: Diagnosis not present

## 2015-12-24 DIAGNOSIS — F1220 Cannabis dependence, uncomplicated: Secondary | ICD-10-CM

## 2015-12-24 DIAGNOSIS — F319 Bipolar disorder, unspecified: Secondary | ICD-10-CM | POA: Diagnosis not present

## 2015-12-24 DIAGNOSIS — F1420 Cocaine dependence, uncomplicated: Secondary | ICD-10-CM

## 2015-12-24 DIAGNOSIS — F3160 Bipolar disorder, current episode mixed, unspecified: Secondary | ICD-10-CM | POA: Diagnosis not present

## 2015-12-24 MED ORDER — ALBUTEROL SULFATE HFA 108 (90 BASE) MCG/ACT IN AERS: 3.0000 | INHALATION_SPRAY | Freq: Four times a day (QID) | RESPIRATORY_TRACT | 1 refills | Status: DC | PRN

## 2015-12-24 MED ORDER — TRAZODONE HCL 50 MG PO TABS: 50.0000 mg | ORAL_TABLET | Freq: Every evening | ORAL | 0 refills | Status: DC | PRN

## 2015-12-24 MED ORDER — NICOTINE 21 MG/24HR TD PT24: 21.0000 mg | MEDICATED_PATCH | Freq: Every day | TRANSDERMAL | 0 refills | Status: DC

## 2015-12-24 MED ORDER — HYDROXYZINE HCL 25 MG PO TABS: 25.0000 mg | ORAL_TABLET | Freq: Four times a day (QID) | ORAL | 0 refills | Status: DC | PRN

## 2015-12-24 NOTE — Progress Notes (Signed)
D:Pt reports that he is looking at following up at [facility] or [facility]. He has been interacting with peers in the unit drinking coffee and requested prn medication for a tooth ache. Pt currently denies hallucinations. He reports having voices daily and describes them as hearing "crickets chirp" and "dogs bark" when he is outside. Pt was asked how he knew that they were not real, he responded that others told him that they did not hear them. A:Offered support and gave medications as ordered. R:Pt denies si and hi. Safety maintained in the observation unit.

## 2015-12-24 NOTE — Progress Notes (Signed)
Pt d/c from the Observation with a bus pass. All items returned. D/c instructions given and prescriptions given.

## 2015-12-24 NOTE — Discharge Instructions (Signed)
Pt was also provided with prescriptions.

## 2015-12-24 NOTE — Discharge Summary (Signed)
[facility] OBS UNIT DISCHARGE SUMMARY   Patient Identification: Raymond Church MRN:  [id number]  Principal Diagnosis: Substance induced mood disorder ([place])  Diagnosis:   Patient Active Problem List   Diagnosis Date Noted  . Substance induced mood disorder ([place]) [F19.94] 12/23/2015    Priority: High  . Bipolar 1 disorder, mixed, moderate ([place]) [F31.62] 09/08/2014    Priority: High  . Cannabis use disorder, severe, dependence ([place]) [F12.20] 09/08/2014    Priority: High  . Cocaine use disorder, severe, dependence ([place]) [F14.20] 09/08/2014    Total Time spent with patient: 30 minutes  Subjective:   [name] is a 25 y.o. male patient who was seen in the ED and OBS UNIT a few days ago, returning yesterday. He spent the night in OBS without incident. Currently, he denies suicidal/homicidal ideation and psychosis and does not appear to be responding to internal stimuli. He was able to secure an intake appointment at [facility] for Monday morning and will discharge today.   HPI:  I have reviewed and concur with HPI elements below, modified as follows: Raymond Church is an 25 y.o. male that reports passive SI without a plan.  Patient reports that he has been hearing and seeing the trees move but no one else is seeing or hearing what he is seeing or hearing.  Patient denies command hallucinations.  Patient reports that a past history of inpatient psychiatric hospitalizations.  Patient denies prior suicide attempts.  Patient reports that he lives with his [name] and his father is a support for him.  Patient was discharged from [facility] on December 13, 2015.  Patient reports that he was not able to follow up with [facility] regarding his psychiatric medication.   Patient reports a past history of substance abuse.  Patient reports that he has not used anything since he was discharged from [facility] on the 13th.  However, patient UDS is positive for cannabis. Patient reports that he does not know why he has been experiencing  so much increased depression.  Patient reports homeless and and feels as if he, "just cannot go on anymore".    Pt spent the night in [place] without incident. He was seen again as above.   Past Psychiatric History:  See HPI  Risk to Self: Is patient at risk for suicide?: No Risk to Others:   Prior Inpatient Therapy:   Prior Outpatient Therapy:    Past Medical History:  Past Medical History:  Diagnosis Date  . Asthma   . Bipolar disorder ([place])   . Depression    History reviewed. No pertinent surgical history. Family History:  Family History  Problem Relation Age of Onset  . Bipolar disorder Mother   . Drug abuse Mother    Family Psychiatric  History:  See HPI Social History:  History  Alcohol Use No    Comment: occasional      History  Drug Use  . Types: Marijuana    Comment: Pt endorsed current use of  THC     Social History   Social History  . Marital status: Single    Spouse name: [name]  . Number of children: [name]  . Years of education: [name]   Social History Main Topics  . Smoking status: Current Every Day Smoker    Packs/day: 0.25    Years: 10.00    Types: Cigarettes  . Smokeless tobacco: Never Used     Comment: refused  . Alcohol use No     Comment: occasional   .  Drug use:     Types: Marijuana     Comment: Pt endorsed current use of  THC   . Sexual activity: Yes   Other Topics Concern  . None   Social History Narrative  . None   Additional Social History:   See HPI    Allergies:   Allergies  Allergen Reactions  . Shellfish Allergy Anaphylaxis  . Amoxicillin Other (See Comments)    Has patient had a PCN reaction causing immediate rash, facial/tongue/throat swelling, SOB or lightheadedness with hypotension: No Has patient had a PCN reaction causing severe rash involving mucus membranes or skin necrosis: No Has patient had a PCN reaction that required hospitalization No Has patient had a PCN reaction occurring within the last 10 years:  No If all of the above answers are "NO", then may proceed with Cephalosporin use.  Marland Kitchen Penicillins Other (See Comments)    Has patient had a PCN reaction causing immediate rash, facial/tongue/throat swelling, SOB or lightheadedness with hypotension: No Has patient had a PCN reaction causing severe rash involving mucus membranes or skin necrosis: No Has patient had a PCN reaction that required hospitalization No Has patient had a PCN reaction occurring within the last 10 years: No If all of the above answers are "NO", then may proceed with Cephalosporin use.  . Mushroom Extract Complex Nausea And Vomiting    Labs:  Results for orders placed or performed during the hospital encounter of 12/23/15 (from the past 48 hour(s))  Comprehensive metabolic panel     Status: Abnormal   Collection Time: 12/23/15  8:40 AM  Result Value Ref Range   Sodium 138 135 - 145 mmol/L   Potassium 4.4 3.5 - 5.1 mmol/L   Chloride 106 101 - 111 mmol/L   CO2 24 22 - 32 mmol/L   Glucose, Bld 93 65 - 99 mg/dL   BUN 18 6 - 20 mg/dL   Creatinine, Ser 1.08 0.61 - 1.24 mg/dL   Calcium 9.2 8.9 - 10.3 mg/dL   Total Protein 6.6 6.5 - 8.1 g/dL   Albumin 3.8 3.5 - 5.0 g/dL   AST 72 (H) 15 - 41 U/L   ALT 188 (H) 17 - 63 U/L   Alkaline Phosphatase 47 38 - 126 U/L   Total Bilirubin 0.5 0.3 - 1.2 mg/dL   GFR calc non Af Amer >60 >60 mL/min   GFR calc Af Amer >60 >60 mL/min    Comment: (NOTE) The eGFR has been calculated using the CKD EPI equation. This calculation has not been validated in all clinical situations. eGFR's persistently <60 mL/min signify possible Chronic Kidney Disease.    Anion gap 8 5 - 15  Ethanol     Status: None   Collection Time: 12/23/15  8:40 AM  Result Value Ref Range   Alcohol, Ethyl (B) <5 <5 mg/dL    Comment:        LOWEST DETECTABLE LIMIT FOR SERUM ALCOHOL IS 5 mg/dL FOR MEDICAL PURPOSES ONLY   Salicylate level     Status: None   Collection Time: 12/23/15  8:40 AM  Result Value Ref  Range   Salicylate Lvl <5.5 2.8 - 30.0 mg/dL  Acetaminophen level     Status: Abnormal   Collection Time: 12/23/15  8:40 AM  Result Value Ref Range   Acetaminophen (Tylenol), Serum <10 (L) 10 - 30 ug/mL    Comment:        THERAPEUTIC CONCENTRATIONS VARY SIGNIFICANTLY. A RANGE OF 10-30 ug/mL MAY BE  AN EFFECTIVE CONCENTRATION FOR MANY PATIENTS. HOWEVER, SOME ARE BEST TREATED AT CONCENTRATIONS OUTSIDE THIS RANGE. ACETAMINOPHEN CONCENTRATIONS >150 ug/mL AT 4 HOURS AFTER INGESTION AND >50 ug/mL AT 12 HOURS AFTER INGESTION ARE OFTEN ASSOCIATED WITH TOXIC REACTIONS.   cbc     Status: Abnormal   Collection Time: 12/23/15  8:40 AM  Result Value Ref Range   WBC 13.9 (H) 4.0 - 10.5 K/uL   RBC 4.23 4.22 - 5.81 MIL/uL   Hemoglobin 13.1 13.0 - 17.0 g/dL   HCT 40.3 39.0 - 52.0 %   MCV 95.3 78.0 - 100.0 fL   MCH 31.0 26.0 - 34.0 pg   MCHC 32.5 30.0 - 36.0 g/dL   RDW 15.2 11.5 - 15.5 %   Platelets 264 150 - 400 K/uL  Troponin I     Status: Abnormal   Collection Time: 12/23/15  8:40 AM  Result Value Ref Range   Troponin I 0.05 (HH) <0.03 ng/mL    Comment: REPEATED TO VERIFY CRITICAL RESULT CALLED TO, READ BACK BY AND VERIFIED WITH: A.BABB,RN 1047 12/23/15 CLARK,S   Rapid urine drug screen (hospital performed)     Status: Abnormal   Collection Time: 12/23/15 10:34 AM  Result Value Ref Range   Opiates NONE DETECTED NONE DETECTED   Cocaine NONE DETECTED NONE DETECTED   Benzodiazepines NONE DETECTED NONE DETECTED   Amphetamines NONE DETECTED NONE DETECTED   Tetrahydrocannabinol POSITIVE (A) NONE DETECTED   Barbiturates NONE DETECTED NONE DETECTED    Comment:        DRUG SCREEN FOR MEDICAL PURPOSES ONLY.  IF CONFIRMATION IS NEEDED FOR ANY PURPOSE, NOTIFY LAB WITHIN 5 DAYS.        LOWEST DETECTABLE LIMITS FOR URINE DRUG SCREEN Drug Class       Cutoff (ng/mL) Amphetamine      1000 Barbiturate      200 Benzodiazepine   956 Tricyclics       213 Opiates          300 Cocaine           300 THC              50     No current facility-administered medications for this encounter.    Musculoskeletal: Strength & Muscle Tone: within normal limits Gait & Station: normal Patient leans: [name]  Psychiatric Specialty Exam: Physical Exam  Vitals reviewed.   Review of Systems  Psychiatric/Behavioral: Positive for depression and substance abuse. Negative for hallucinations and suicidal ideas. The patient is nervous/anxious and has insomnia.   All other systems reviewed and are negative.   BP 113/69 (BP Location: Right Arm)   Pulse 86   Temp 98 F (36.7 C)   Resp 18   Ht '5\' 8"'$  (1.727 m)   Wt 67.2 kg (148 lb 2.4 oz)   BMI 22.53 kg/m    General Appearance: Casual and Fairly Groomed  Eye Contact:  Good and improving  Speech:  Clear and Coherent  Volume:  Normal  Mood:  Euthymic, mildly depressed at times  Affect:  Appropriate  Thought Process:  Coherent  Orientation:  Full (Time, Place, and Person)  Thought Content:  Discharge plans, appointments  Suicidal Thoughts:  No and is able to contract for safety.   Homicidal Thoughts:  No  Memory:  Immediate;   Fair Recent;   Fair Remote;   Fair  Judgement:  Fair  Insight:  Fair  Psychomotor Activity:  Normal  Concentration:  Concentration: Fair and Attention Span:  Fair  Recall:  [employer] of Knowledge:  Fair  Language:  Good  Akathisia:  Negative  Handed:  Right  AIMS (if indicated):     Assets:  Communication Skills Resilience Social Support  ADL's:  Intact  Cognition:  WNL  Sleep:  poor   Treatment Plan Summary: Substance induced mood disorder ([place]) managed as below:   Medications: -Ibuprofen '600mg'$  q6h prn pain  -Trazodone '50mg'$  po qhs prn insomnia  -Vistaril '25mg'$  po q6h prn anxiety   Disposition: -Discharge home -Intake appointment at [facility] on Monday morning. -Short-term Rx given for psychotropic medications  Benjamine Mola, [name] [facility] 12/24/2015 10:25 AM

## 2015-12-24 NOTE — H&P (Signed)
[facility] OBS UNIT H&P   Patient Identification: Raymond Church MRN:  [id number] Principal Diagnosis: Substance induced mood disorder ([place]) Diagnosis:   Patient Active Problem List   Diagnosis Date Noted  . Substance induced mood disorder ([place]) [F19.94] 12/23/2015    Priority: High  . Bipolar 1 disorder, mixed, moderate ([place]) [F31.62] 09/08/2014    Priority: High  . Cannabis use disorder, severe, dependence ([place]) [F12.20] 09/08/2014    Priority: High  . Cocaine use disorder, severe, dependence ([place]) [F14.20] 09/08/2014    Total Time spent with patient: 45 minutes  Subjective:   Raymond Church is a 25 y.o. male patient admitted with chest pain to ED, then later stating he felt overwhelmed due to drug use and felt helpless. Pt was discharged by Dr. Louretta Shorten on 12/20/15 for similar presentation. Pt seen and chart reviewed. Pt is alert/oriented x4, calm, cooperative, and appropriate to situation. Pt denies suicidal/homicidal ideation and psychosis and does not appear to be responding to internal stimuli. Pt reports that he would like to go to [facility] or [facility].   HPI:  I have reviewed and concur with HPI elements below, modified as follows: Raymond Church is an 24 y.o. male that reports passive SI without a plan.  Patient reports that he has been hearing and seeing the trees move but no one else is seeing or hearing what he is seeing or hearing.  Patient denies command hallucinations.  Patient reports that a past history of inpatient psychiatric hospitalizations.  Patient denies prior suicide attempts.  Patient reports that he lives with his [name] and his father is a support for him.  Patient was discharged from [facility] on December 13, 2015.  Patient reports that he was not able to follow up with [facility] regarding his psychiatric medication.   Patient reports a past history of substance abuse.  Patient reports that he has not used anything since he was discharged from [facility] on the 13th.  However, patient  UDS is positive for cannabis. Patient reports that he does not know why he has been experiencing so much increased depression.  Patient reports homeless and and feels as if he, "just cannot go on anymore".    Past Psychiatric History:  See HPI  Risk to Self: Is patient at risk for suicide?: No Risk to Others:   Prior Inpatient Therapy:   Prior Outpatient Therapy:    Past Medical History:  Past Medical History:  Diagnosis Date  . Asthma   . Bipolar disorder ([place])   . Depression    History reviewed. No pertinent surgical history. Family History:  Family History  Problem Relation Age of Onset  . Bipolar disorder Mother   . Drug abuse Mother    Family Psychiatric  History:  See HPI Social History:  History  Alcohol Use No    Comment: occasional      History  Drug Use  . Types: Marijuana    Comment: Pt endorsed current use of  THC     Social History   Social History  . Marital status: Single    Spouse name: [name]  . Number of children: [name]  . Years of education: [name]   Social History Main Topics  . Smoking status: Current Every Day Smoker    Packs/day: 0.25    Years: 10.00    Types: Cigarettes  . Smokeless tobacco: Never Used     Comment: refused  . Alcohol use No     Comment: occasional   . Drug use:  Types: Marijuana     Comment: Pt endorsed current use of  THC   . Sexual activity: Yes   Other Topics Concern  . None   Social History Narrative  . None   Additional Social History:   See HPI    Allergies:   Allergies  Allergen Reactions  . Shellfish Allergy Anaphylaxis  . Amoxicillin Other (See Comments)    Has patient had a PCN reaction causing immediate rash, facial/tongue/throat swelling, SOB or lightheadedness with hypotension: No Has patient had a PCN reaction causing severe rash involving mucus membranes or skin necrosis: No Has patient had a PCN reaction that required hospitalization No Has patient had a PCN reaction occurring within the last  10 years: No If all of the above answers are "NO", then may proceed with Cephalosporin use.  Marland Kitchen Penicillins Other (See Comments)    Has patient had a PCN reaction causing immediate rash, facial/tongue/throat swelling, SOB or lightheadedness with hypotension: No Has patient had a PCN reaction causing severe rash involving mucus membranes or skin necrosis: No Has patient had a PCN reaction that required hospitalization No Has patient had a PCN reaction occurring within the last 10 years: No If all of the above answers are "NO", then may proceed with Cephalosporin use.  . Mushroom Extract Complex Nausea And Vomiting    Labs:  Results for orders placed or performed during the hospital encounter of 12/23/15 (from the past 48 hour(s))  Comprehensive metabolic panel     Status: Abnormal   Collection Time: 12/23/15  8:40 AM  Result Value Ref Range   Sodium 138 135 - 145 mmol/L   Potassium 4.4 3.5 - 5.1 mmol/L   Chloride 106 101 - 111 mmol/L   CO2 24 22 - 32 mmol/L   Glucose, Bld 93 65 - 99 mg/dL   BUN 18 6 - 20 mg/dL   Creatinine, Ser 1.08 0.61 - 1.24 mg/dL   Calcium 9.2 8.9 - 10.3 mg/dL   Total Protein 6.6 6.5 - 8.1 g/dL   Albumin 3.8 3.5 - 5.0 g/dL   AST 72 (H) 15 - 41 U/L   ALT 188 (H) 17 - 63 U/L   Alkaline Phosphatase 47 38 - 126 U/L   Total Bilirubin 0.5 0.3 - 1.2 mg/dL   GFR calc non Af Amer >60 >60 mL/min   GFR calc Af Amer >60 >60 mL/min    Comment: (NOTE) The eGFR has been calculated using the CKD EPI equation. This calculation has not been validated in all clinical situations. eGFR's persistently <60 mL/min signify possible Chronic Kidney Disease.    Anion gap 8 5 - 15  Ethanol     Status: None   Collection Time: 12/23/15  8:40 AM  Result Value Ref Range   Alcohol, Ethyl (B) <5 <5 mg/dL    Comment:        LOWEST DETECTABLE LIMIT FOR SERUM ALCOHOL IS 5 mg/dL FOR MEDICAL PURPOSES ONLY   Salicylate level     Status: None   Collection Time: 12/23/15  8:40 AM  Result  Value Ref Range   Salicylate Lvl <4.2 2.8 - 30.0 mg/dL  Acetaminophen level     Status: Abnormal   Collection Time: 12/23/15  8:40 AM  Result Value Ref Range   Acetaminophen (Tylenol), Serum <10 (L) 10 - 30 ug/mL    Comment:        THERAPEUTIC CONCENTRATIONS VARY SIGNIFICANTLY. A RANGE OF 10-30 ug/mL MAY BE AN EFFECTIVE CONCENTRATION FOR MANY PATIENTS.  HOWEVER, SOME ARE BEST TREATED AT CONCENTRATIONS OUTSIDE THIS RANGE. ACETAMINOPHEN CONCENTRATIONS >150 ug/mL AT 4 HOURS AFTER INGESTION AND >50 ug/mL AT 12 HOURS AFTER INGESTION ARE OFTEN ASSOCIATED WITH TOXIC REACTIONS.   cbc     Status: Abnormal   Collection Time: 12/23/15  8:40 AM  Result Value Ref Range   WBC 13.9 (H) 4.0 - 10.5 K/uL   RBC 4.23 4.22 - 5.81 MIL/uL   Hemoglobin 13.1 13.0 - 17.0 g/dL   HCT 40.3 39.0 - 52.0 %   MCV 95.3 78.0 - 100.0 fL   MCH 31.0 26.0 - 34.0 pg   MCHC 32.5 30.0 - 36.0 g/dL   RDW 15.2 11.5 - 15.5 %   Platelets 264 150 - 400 K/uL  Troponin I     Status: Abnormal   Collection Time: 12/23/15  8:40 AM  Result Value Ref Range   Troponin I 0.05 (HH) <0.03 ng/mL    Comment: REPEATED TO VERIFY CRITICAL RESULT CALLED TO, READ BACK BY AND VERIFIED WITH: A.BABB,RN 1047 12/23/15 CLARK,S   Rapid urine drug screen (hospital performed)     Status: Abnormal   Collection Time: 12/23/15 10:34 AM  Result Value Ref Range   Opiates NONE DETECTED NONE DETECTED   Cocaine NONE DETECTED NONE DETECTED   Benzodiazepines NONE DETECTED NONE DETECTED   Amphetamines NONE DETECTED NONE DETECTED   Tetrahydrocannabinol POSITIVE (A) NONE DETECTED   Barbiturates NONE DETECTED NONE DETECTED    Comment:        DRUG SCREEN FOR MEDICAL PURPOSES ONLY.  IF CONFIRMATION IS NEEDED FOR ANY PURPOSE, NOTIFY LAB WITHIN 5 DAYS.        LOWEST DETECTABLE LIMITS FOR URINE DRUG SCREEN Drug Class       Cutoff (ng/mL) Amphetamine      1000 Barbiturate      200 Benzodiazepine   893 Tricyclics       734 Opiates           300 Cocaine          300 THC              50     No current facility-administered medications for this encounter.    Musculoskeletal: Strength & Muscle Tone: within normal limits Gait & Station: normal Patient leans: [name]  Psychiatric Specialty Exam: Physical Exam  Vitals reviewed.   Review of Systems  Psychiatric/Behavioral: Positive for depression and substance abuse. Negative for hallucinations and suicidal ideas. The patient is nervous/anxious and has insomnia.   All other systems reviewed and are negative.   Blood pressure 103/69, pulse 99, temperature 98.8 F (37.1 C), resp. rate 16, height 5' 8" (1.727 m), weight 67.2 kg (148 lb 2.4 oz).Body mass index is 22.53 kg/m.  General Appearance: Casual and Fairly Groomed  Eye Contact:  Good  Speech:  Clear and Coherent  Volume:  Normal  Mood:  Anxious  Affect:  Appropriate  Thought Process:  Coherent  Orientation:  Full (Time, Place, and Person)  Thought Content:  Symptoms, worries, focused on somatic complaints such as pain in back and mouth  Suicidal Thoughts:  No  Homicidal Thoughts:  No  Memory:  Immediate;   Fair Recent;   Fair Remote;   Fair  Judgement:  Fair  Insight:  Fair  Psychomotor Activity:  Normal  Concentration:  Concentration: Fair and Attention Span: Fair  Recall:  [employer] of Knowledge:  Fair  Language:  Good  Akathisia:  Negative  Handed:  Right  AIMS (if indicated):     Assets:  Communication Skills Resilience Social Support  ADL's:  Intact  Cognition:  WNL  Sleep:  poor   Treatment Plan Summary: Substance induced mood disorder ([place]) managed as below:   Medications: -Ibuprofen 665m q6h prn pain  -Trazodone 510mpo qhs prn insomnia -Vistaril 2547mo q6h prn anxiety  [facility]: [facility] resources and [facility] in [place] has been non-compliant with previous outpatient recommendations and would greatly benefit from this  resource.   WitBenjamine MolaNP BC [facility]23/2017 4:35 PM

## 2015-12-24 NOTE — [facility] Counselor (Signed)
This Clinical research associatewriter made collateral contact to [facility] and spoke with Eber Jones(Carolyn) who verified that they will have some beds available by next Monday, August 28. This pt has a scheduled appointment for Residential Treatment on Monday, December 28, 2015 @ 11:45 am. The pt was receptive of this information. Pt will be discharged later this afternoon per Constance HawJohn Withrow, [name].

## 2016-04-10 ENCOUNTER — Encounter (HOSPITAL_COMMUNITY): Payer: Self-pay | Admitting: Emergency Medicine

## 2016-04-10 ENCOUNTER — Emergency Department (HOSPITAL_COMMUNITY)
Admission: EM | Admit: 2016-04-10 | Discharge: 2016-04-11 | Disposition: A | Discharge status: Home / Self Care | Payer: Self-pay | Attending: Emergency Medicine | Admitting: Emergency Medicine

## 2016-04-10 ENCOUNTER — Emergency Department (HOSPITAL_COMMUNITY): Payer: Self-pay

## 2016-04-10 DIAGNOSIS — R079 Chest pain, unspecified: Secondary | ICD-10-CM | POA: Insufficient documentation

## 2016-04-10 DIAGNOSIS — F17210 Nicotine dependence, cigarettes, uncomplicated: Secondary | ICD-10-CM | POA: Insufficient documentation

## 2016-04-10 DIAGNOSIS — Z79899 Other long term (current) drug therapy: Secondary | ICD-10-CM | POA: Insufficient documentation

## 2016-04-10 DIAGNOSIS — J45909 Unspecified asthma, uncomplicated: Secondary | ICD-10-CM | POA: Insufficient documentation
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
## 2016-04-11 LAB — RAPID URINE DRUG SCREEN, HOSP PERFORMED
Amphetamines: NOT DETECTED
BARBITURATES: NOT DETECTED
BENZODIAZEPINES: NOT DETECTED
COCAINE: NOT DETECTED
Opiates: NOT DETECTED
TETRAHYDROCANNABINOL: POSITIVE — AB

## 2016-04-11 LAB — CBC
HEMATOCRIT: 41.4 % (ref 39.0–52.0)
HEMOGLOBIN: 14.2 g/dL (ref 13.0–17.0)
MCH: 31.4 pg (ref 26.0–34.0)
MCHC: 34.3 g/dL (ref 30.0–36.0)
MCV: 91.6 fL (ref 78.0–100.0)
Platelets: 292 10*3/uL (ref 150–400)
RBC: 4.52 MIL/uL (ref 4.22–5.81)
RDW: 14.2 % (ref 11.5–15.5)
WBC: 15.5 10*3/uL — ABNORMAL HIGH (ref 4.0–10.5)

## 2016-04-11 LAB — BASIC METABOLIC PANEL
ANION GAP: 7 (ref 5–15)
BUN: 18 mg/dL (ref 6–20)
CHLORIDE: 106 mmol/L (ref 101–111)
CO2: 25 mmol/L (ref 22–32)
Calcium: 8.5 mg/dL — ABNORMAL LOW (ref 8.9–10.3)
Creatinine, Ser: 1.08 mg/dL (ref 0.61–1.24)
GFR calc Af Amer: >60 mL/min (ref >60)
GFR calc non Af Amer: >60 mL/min (ref >60)
GLUCOSE: 80 mg/dL (ref 65–99)
POTASSIUM: 3.7 mmol/L (ref 3.5–5.1)
Sodium: 138 mmol/L (ref 135–145)

## 2016-04-11 LAB — I-STAT TROPONIN, ED: Troponin i, poc: 0.02 ng/mL (ref 0.00–0.08)

## 2016-04-11 LAB — I-STAT CG4 LACTIC ACID, ED
Lactic Acid, Venous: 1.46 mmol/L (ref 0.5–1.9)
Lactic Acid, Venous: 4.78 mmol/L (ref 0.5–1.9)

## 2016-04-11 LAB — ETHANOL: Alcohol, Ethyl (B): <5 mg/dL (ref <5)

## 2016-04-11 MED ORDER — SODIUM CHLORIDE 0.9 % IV BOLUS (SEPSIS): 1000.0000 mL | Freq: Once | INTRAVENOUS | Status: DC

## 2016-04-11 MED ORDER — LORAZEPAM 1 MG PO TABS
1.0000 mg | ORAL_TABLET | Freq: Once | ORAL | Status: AC
  Administered 2016-04-11: 1 mg via ORAL
  Filled 2016-04-11: qty 1

## 2016-04-11 MED ORDER — LORAZEPAM 2 MG/ML IJ SOLN: 1.0000 mg | Freq: Once | INTRAMUSCULAR | Status: DC

## 2016-04-11 MED ORDER — SODIUM CHLORIDE 0.9 % IV BOLUS (SEPSIS)
1000.0000 mL | Freq: Once | INTRAVENOUS | Status: AC
Start: 2016-04-11 — End: 2016-04-11
  Administered 2016-04-11: 1000 mL via INTRAVENOUS

## 2016-04-11 NOTE — ED Notes (Signed)
PATIENT HAS A LACTIC OF 4.78

## 2016-04-11 NOTE — ED Notes (Signed)
Patient given sandwich, milk,peanutbutter and crackers

## 2016-04-11 NOTE — ED Notes (Signed)
Patient transported to X-ray 

## 2016-05-03 ENCOUNTER — Emergency Department (HOSPITAL_COMMUNITY)
Admission: EM | Admit: 2016-05-03 | Discharge: 2016-05-04 | Disposition: A | Discharge status: Other Acute Inpatient Hospital | Payer: Self-pay | Attending: Emergency Medicine | Admitting: Emergency Medicine

## 2016-05-03 ENCOUNTER — Encounter (HOSPITAL_COMMUNITY): Payer: Self-pay | Admitting: Emergency Medicine

## 2016-05-03 DIAGNOSIS — J45909 Unspecified asthma, uncomplicated: Secondary | ICD-10-CM | POA: Insufficient documentation

## 2016-05-03 DIAGNOSIS — F29 Unspecified psychosis not due to a substance or known physiological condition: Secondary | ICD-10-CM | POA: Insufficient documentation

## 2016-05-03 DIAGNOSIS — Z79899 Other long term (current) drug therapy: Secondary | ICD-10-CM | POA: Insufficient documentation

## 2016-05-03 DIAGNOSIS — F3162 Bipolar disorder, current episode mixed, moderate: Secondary | ICD-10-CM | POA: Diagnosis present

## 2016-05-03 DIAGNOSIS — F1910 Other psychoactive substance abuse, uncomplicated: Secondary | ICD-10-CM

## 2016-05-03 DIAGNOSIS — R443 Hallucinations, unspecified: Secondary | ICD-10-CM

## 2016-05-03 DIAGNOSIS — F17210 Nicotine dependence, cigarettes, uncomplicated: Secondary | ICD-10-CM | POA: Insufficient documentation

## 2016-05-03 LAB — CBC
HCT: 39.5 % (ref 39.0–52.0)
Hemoglobin: 13.5 g/dL (ref 13.0–17.0)
MCH: 30.8 pg (ref 26.0–34.0)
MCHC: 34.2 g/dL (ref 30.0–36.0)
MCV: 90 fL (ref 78.0–100.0)
PLATELETS: 316 10*3/uL (ref 150–400)
RBC: 4.39 MIL/uL (ref 4.22–5.81)
RDW: 14.1 % (ref 11.5–15.5)
WBC: 16.9 10*3/uL — AB (ref 4.0–10.5)

## 2016-05-03 LAB — COMPREHENSIVE METABOLIC PANEL
ALT: 153 U/L — ABNORMAL HIGH (ref 17–63)
AST: 235 U/L — AB (ref 15–41)
Albumin: 4.9 g/dL (ref 3.5–5.0)
Alkaline Phosphatase: 45 U/L (ref 38–126)
Anion gap: 12 (ref 5–15)
BUN: 20 mg/dL (ref 6–20)
CHLORIDE: 105 mmol/L (ref 101–111)
CO2: 21 mmol/L — ABNORMAL LOW (ref 22–32)
Calcium: 9.1 mg/dL (ref 8.9–10.3)
Creatinine, Ser: 1.48 mg/dL — ABNORMAL HIGH (ref 0.61–1.24)
Glucose, Bld: 90 mg/dL (ref 65–99)
POTASSIUM: 3.6 mmol/L (ref 3.5–5.1)
SODIUM: 138 mmol/L (ref 135–145)
Total Bilirubin: 2.1 mg/dL — ABNORMAL HIGH (ref 0.3–1.2)
Total Protein: 8.4 g/dL — ABNORMAL HIGH (ref 6.5–8.1)

## 2016-05-03 LAB — ETHANOL

## 2016-05-03 MED ORDER — ZIPRASIDONE MESYLATE 20 MG IM SOLR
10.0000 mg | Freq: Once | INTRAMUSCULAR | Status: AC
  Administered 2016-05-03: 10 mg via INTRAMUSCULAR

## 2016-05-03 MED ORDER — SODIUM CHLORIDE 0.9 % IV SOLN: 1000.0000 mL | INTRAVENOUS | Status: DC

## 2016-05-03 MED ORDER — ZIPRASIDONE MESYLATE 20 MG IM SOLR
INTRAMUSCULAR | Status: AC
  Filled 2016-05-03: qty 20

## 2016-05-03 MED ORDER — SODIUM CHLORIDE 0.9 % IV SOLN
1000.0000 mL | Freq: Once | INTRAVENOUS | Status: AC
  Administered 2016-05-03: 1000 mL via INTRAVENOUS

## 2016-05-03 NOTE — ED Notes (Signed)
Called Magistrate in reference to IVC paperwork stating patient had "post-partum depression". Magistrate states that "we write what they tell us".
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
## 2016-05-04 ENCOUNTER — Emergency Department (HOSPITAL_COMMUNITY): Payer: Self-pay

## 2016-05-04 ENCOUNTER — Inpatient Hospital Stay (HOSPITAL_COMMUNITY)
Admission: AD | Admit: 2016-05-04 | Discharge: 2016-05-07 | DRG: 885 | Disposition: A | Discharge status: Home / Self Care | Payer: Federal, State, Local not specified - Other | Attending: Psychiatry | Admitting: Psychiatry

## 2016-05-04 DIAGNOSIS — F3160 Bipolar disorder, current episode mixed, unspecified: Secondary | ICD-10-CM | POA: Diagnosis present

## 2016-05-04 DIAGNOSIS — Z818 Family history of other mental and behavioral disorders: Secondary | ICD-10-CM

## 2016-05-04 DIAGNOSIS — F1420 Cocaine dependence, uncomplicated: Secondary | ICD-10-CM | POA: Diagnosis present

## 2016-05-04 DIAGNOSIS — Z9114 Patient's other noncompliance with medication regimen: Secondary | ICD-10-CM | POA: Diagnosis not present

## 2016-05-04 DIAGNOSIS — F319 Bipolar disorder, unspecified: Secondary | ICD-10-CM | POA: Diagnosis present

## 2016-05-04 DIAGNOSIS — F1610 Hallucinogen abuse, uncomplicated: Secondary | ICD-10-CM | POA: Diagnosis present

## 2016-05-04 DIAGNOSIS — F3164 Bipolar disorder, current episode mixed, severe, with psychotic features: Secondary | ICD-10-CM | POA: Diagnosis present

## 2016-05-04 DIAGNOSIS — F3162 Bipolar disorder, current episode mixed, moderate: Secondary | ICD-10-CM | POA: Diagnosis present

## 2016-05-04 DIAGNOSIS — F17210 Nicotine dependence, cigarettes, uncomplicated: Secondary | ICD-10-CM | POA: Diagnosis present

## 2016-05-04 DIAGNOSIS — F1220 Cannabis dependence, uncomplicated: Secondary | ICD-10-CM | POA: Diagnosis present

## 2016-05-04 MED ORDER — HYDROXYZINE HCL 25 MG PO TABS
25.0000 mg | ORAL_TABLET | Freq: Four times a day (QID) | ORAL | Status: DC | PRN
  Administered 2016-05-05: 25 mg via ORAL
  Filled 2016-05-04: qty 10
  Filled 2016-05-04: qty 1

## 2016-05-04 MED ORDER — CARBAMAZEPINE 200 MG PO TABS
200.0000 mg | ORAL_TABLET | Freq: Two times a day (BID) | ORAL | Status: DC
  Administered 2016-05-05: 200 mg via ORAL
  Filled 2016-05-04 (×4): qty 1

## 2016-05-04 MED ORDER — TRAZODONE HCL 50 MG PO TABS
50.0000 mg | ORAL_TABLET | Freq: Every evening | ORAL | Status: DC | PRN
  Administered 2016-05-05 – 2016-05-06 (×2): 50 mg via ORAL
  Filled 2016-05-04: qty 1
  Filled 2016-05-04: qty 7

## 2016-05-04 MED ORDER — CARBAMAZEPINE 200 MG PO TABS
200.0000 mg | ORAL_TABLET | Freq: Two times a day (BID) | ORAL | Status: DC
  Administered 2016-05-04: 200 mg via ORAL
  Filled 2016-05-04: qty 1

## 2016-05-04 MED ORDER — NICOTINE 21 MG/24HR TD PT24
21.0000 mg | MEDICATED_PATCH | Freq: Every day | TRANSDERMAL | Status: DC
  Administered 2016-05-04: 21 mg via TRANSDERMAL
  Filled 2016-05-04: qty 1

## 2016-05-04 MED ORDER — LURASIDONE HCL 40 MG PO TABS
40.0000 mg | ORAL_TABLET | Freq: Every day | ORAL | Status: DC
  Administered 2016-05-04: 40 mg via ORAL
  Filled 2016-05-04: qty 1

## 2016-05-04 MED ORDER — ACETAMINOPHEN 325 MG PO TABS
650.0000 mg | ORAL_TABLET | Freq: Four times a day (QID) | ORAL | Status: DC | PRN
  Administered 2016-05-05 – 2016-05-07 (×2): 650 mg via ORAL
  Filled 2016-05-04 (×2): qty 2

## 2016-05-04 MED ORDER — LIDOCAINE HCL 1 % IJ SOLN
INTRAMUSCULAR | Status: AC
  Administered 2016-05-04: 14:00:00 via INTRAMUSCULAR
  Filled 2016-05-04: qty 20

## 2016-05-04 MED ORDER — TRAZODONE HCL 50 MG PO TABS
50.0000 mg | ORAL_TABLET | Freq: Every evening | ORAL | Status: DC | PRN
  Administered 2016-05-04: 50 mg via ORAL
  Filled 2016-05-04: qty 1

## 2016-05-04 MED ORDER — NICOTINE 21 MG/24HR TD PT24
21.0000 mg | MEDICATED_PATCH | Freq: Every day | TRANSDERMAL | Status: DC
  Administered 2016-05-05: 21 mg via TRANSDERMAL
  Filled 2016-05-04 (×4): qty 1

## 2016-05-04 MED ORDER — ALBUTEROL SULFATE HFA 108 (90 BASE) MCG/ACT IN AERS
3.0000 | INHALATION_SPRAY | Freq: Four times a day (QID) | RESPIRATORY_TRACT | Status: DC | PRN
  Administered 2016-05-05: 3 via RESPIRATORY_TRACT

## 2016-05-04 MED ORDER — AZITHROMYCIN 250 MG PO TABS
1000.0000 mg | ORAL_TABLET | Freq: Once | ORAL | Status: AC
  Administered 2016-05-04: 1000 mg via ORAL
  Filled 2016-05-04: qty 4

## 2016-05-04 MED ORDER — CEFTRIAXONE SODIUM 250 MG IJ SOLR
250.0000 mg | Freq: Once | INTRAMUSCULAR | Status: AC
  Administered 2016-05-04: 250 mg via INTRAMUSCULAR
  Filled 2016-05-04: qty 250

## 2016-05-04 MED ORDER — ALUM & MAG HYDROXIDE-SIMETH 200-200-20 MG/5ML PO SUSP: 30.0000 mL | ORAL | Status: DC | PRN

## 2016-05-04 MED ORDER — LURASIDONE HCL 40 MG PO TABS
40.0000 mg | ORAL_TABLET | Freq: Every day | ORAL | Status: DC
  Filled 2016-05-04: qty 1

## 2016-05-04 MED ORDER — ALBUTEROL SULFATE HFA 108 (90 BASE) MCG/ACT IN AERS
3.0000 | INHALATION_SPRAY | Freq: Four times a day (QID) | RESPIRATORY_TRACT | Status: DC | PRN
  Administered 2016-05-04: 4 via RESPIRATORY_TRACT
  Filled 2016-05-04: qty 6.7

## 2016-05-04 MED ORDER — HYDROXYZINE HCL 25 MG PO TABS
25.0000 mg | ORAL_TABLET | Freq: Four times a day (QID) | ORAL | Status: DC | PRN
  Administered 2016-05-04: 25 mg via ORAL
  Filled 2016-05-04: qty 1

## 2016-05-04 MED ORDER — MAGNESIUM HYDROXIDE 400 MG/5ML PO SUSP: 30.0000 mL | Freq: Every day | ORAL | Status: DC | PRN

## 2016-05-04 NOTE — BH Assessment (Signed)
[facility] Assessment Progress Note  Per Thedore MinsMojeed Akintayo, MD, this pt requires psychiatric hospitalization.  Berneice Heinrichina Tate, RN, [facility].  Pt presents under IVC initiated by his girlfriend, and upheld by Dr Jannifer FranklinAkintayo, and [facility].  Pt's nurse, Morrie Sheldonshley, has been notified, and agrees to call report to [phone].  Pt is to be transported via [employer].   Doylene Canninghomas Aven Cegielski, MA Triage Specialist [phone]

## 2016-05-04 NOTE — ED Provider Notes (Signed)
Blood pressure 119/65, pulse 104, temperature 98.2 F (36.8 C), temperature source Oral, resp. rate 20, SpO2 94 %.  In short, Raymond Church is a 26 y.o. male with a chief complaint of Drug Problem .  Refer to the original H&P for additional details.  11:35 AM Asked to evaluate the patient for bleeding from the bottom of his left foot and concern for foreign body. Patient is also complaining of penile discharge and has concerned that he may have a sexually transmitted infection. He's had ceftriaxone injections previously. He has an avulsion injury to the vomitus left foot with no obvious foreign body. We'll obtain plain film to assess for radiopaque foreign body.   01:06 PM X-ray of the foot is negative. Low clinical suspicion for non-radiopaque glass in the foot after my exam. As stated above the patient appears to have an avulsion injury which is likely the cause of his pain. Recommend regular wound care and primary care physician follow-up upon discharge as needed. Patient is medically clear.   [name], MD    [name], MD 05/04/16 [phone]

## 2016-05-04 NOTE — ED Notes (Signed)
Pt off unit for xray, security with pt.

## 2016-05-04 NOTE — Progress Notes (Signed)
ED CM left pt [place] resources in his locker #37 CM spoke with pt who [place] insurance informs Cm he goes to the "health department on [address]".  CM provided written information to assist pt with determining choice for uninsured accepting pcps, discussed the importance of pcp vs EDP services for f/u care, www.needymeds.org, www.goodrx.com, discounted pharmacies and other [place] resources such as [facility] , [facility], affordable care act, financial assistance, uninsured dental services, [place] med assist, DSS and  health department  Provided resources for [place] uninsured accepting pcps like [facility], family medicine at [address], [facility], [facility], local urgent care centers, [facility], [facility] family [facility], general medical clinics, family services of the [place], [facility] urgent care plus others, medication resources, [facility] out patient pharmacies and housing Provided [facility] contact information

## 2016-05-04 NOTE — ED Notes (Signed)
Pt c/o glass in foot, EDP notified and at bedside,

## 2016-05-04 NOTE — ED Notes (Signed)
[employer] transport requested. 

## 2016-05-04 NOTE — Progress Notes (Signed)
05/04/16 1405:  LRT went to pt room to offer activities, pt was sleep.  Caroll RancherMarjette Damontae Loppnow, LRT/CTRS

## 2016-05-04 NOTE — ED Notes (Signed)
Pt transferred from main ed, presents under IVC.  Family reports pt using Molly and hasn't slept or eaten in days.  Pt very animated, euphoric, dancing around.  Awake, alert & responsive, no distress noted, cooperative at present.  Comfort measures given.  Monitoring for safety, Q 15 min checks in effect.  Safety, check for contraband completed, no items found.

## 2016-05-04 NOTE — ED Notes (Signed)
Report attempted, RN Otto Herbreka to return call.

## 2016-05-04 NOTE — ED Notes (Signed)
Pt breathing easier, no distress noted, skin color good, resp. Even and unlabored.

## 2016-05-04 NOTE — ED Notes (Addendum)
Pt states he feels tight.  Albuterol given.  Dr Clydene PughKnott notified.

## 2016-05-04 NOTE — BH Assessment (Signed)
Tele Assessment Note   Raymond Church is an 26 y.o. male who presents to the ED under IVC initiated by his girlfriend Raymond Church due to being non-compliant with his medication and a danger to himself. Per EMS, pt has been taking "molly" for the past 4 days and has been awake for the past 5 days. While assessing the pt, he appeared to be responding to internal and external stimuli. Pt was observed as looking around the room saying "huh" and moving his head in a rapid motion as if he was looking at other people in the room although there was no one else present aside from the pt and the assessor. Pt was asked if he could identify the reason he came to the hospital and he stated "I don't know." Pt was brought in by EMS however when asked how he got to the hospital pt stated "oh I came in by myself." Pt stated he prefers to be called "blessed" and continued responding to internal stimuli during the assessment. Pt denied suicidal thoughts and homicidal ideations.  I contacted the pt's girlfriend at [phone] in order to obtain collateral information. The pt's girlfriend reports she initiated the IVC because the pt was "screaming at the top of his lungs and slamming his hand down on the desk repeatedly." Pt's girlfriend initially stated this was happening for about 3 hours, then stated "no it was about 30 minutes."   Per [name], PA pt meets criteria for inpt treatment and [facility]. [name] not [facility]. TTS to seek placement.   Diagnosis: Schizophrenia   Past Medical History:  Past Medical History:  Diagnosis Date   Asthma    Bipolar disorder (HCC)    Depression     History reviewed. No pertinent surgical history.  Family History:  Family History  Problem Relation Age of Onset   Bipolar disorder Mother    Drug abuse Mother     Social History:  reports that he has been smoking Cigarettes.  He has a 2.50 pack-year  smoking history. He has never used smokeless tobacco. He reports that he uses drugs, including Marijuana. He reports that he does not drink alcohol.  Additional Social History:  Alcohol / Drug Use Pain Medications: See PTA Prescriptions: See PTA Over the Counter: See PTA History of alcohol / drug use?: Yes (pt  reportedly has been taking "Molly" for 4 days but pt denies drug use) Longest period of sobriety (when/how long): Unknown  CIWA: CIWA-Ar BP: 126/91 Pulse Rate: 81 COWS:    PATIENT STRENGTHS: (choose at least two) Communication skills Physical Health Supportive family/friends  Allergies:  Allergies  Allergen Reactions   Shellfish Allergy Anaphylaxis   Amoxicillin Other (See Comments)    Has patient had a PCN reaction causing immediate rash, facial/tongue/throat swelling, SOB or lightheadedness with hypotension: No Has patient had a PCN reaction causing severe rash involving mucus membranes or skin necrosis: No Has patient had a PCN reaction that required hospitalization No Has patient had a PCN reaction occurring within the last 10 years: No If all of the above answers are "NO", then may proceed with Cephalosporin use.   Penicillins Other (See Comments)    Has patient had a PCN reaction causing immediate rash, facial/tongue/throat swelling, SOB or lightheadedness with hypotension: No Has patient had a PCN reaction causing severe rash involving mucus membranes or skin necrosis: No Has patient had a PCN reaction  that required hospitalization No Has patient had a PCN reaction occurring within the last 10 years: No If all of the above answers are "NO", then may proceed with Cephalosporin use.   Mushroom Extract Complex Nausea And Vomiting    Home Medications:  (Not in a hospital admission)  OB/GYN Status:  No LMP for male patient.  General Assessment Data Location of Assessment: [facility] ED TTS Assessment: In system Is this a Tele or Face-to-Face Assessment?:  Face-to-Face Is this an Initial Assessment or a Re-assessment for this encounter?: Initial Assessment Marital status: Single Is patient pregnant?: No Pregnancy Status: No Living Arrangements: Spouse/significant other Can pt return to current living arrangement?: Yes Admission Status: Involuntary Is patient capable of signing voluntary admission?: No Referral Source: Other ([facility]) Insurance type: none     Crisis Care Plan Living Arrangements: Spouse/significant other Name of Psychiatrist: none Name of Therapist: none  Education Status Is patient currently in school?: No Highest grade of school patient has completed: college  Risk to self with the past 6 months Suicidal Ideation: No Has patient been a risk to self within the past 6 months prior to admission? : No Suicidal Intent: No Has patient had any suicidal intent within the past 6 months prior to admission? : No Is patient at risk for suicide?: No Suicidal Plan?: No Has patient had any suicidal plan within the past 6 months prior to admission? : No Access to Means: No What has been your use of drugs/alcohol within the last 12 months?: denies use, EMS reports pt has been using "Molly" for the past 4 days  Previous Attempts/Gestures: No Triggers for Past Attempts: None known Intentional Self Injurious Behavior: None Family Suicide History: No Recent stressful life event(s): Other (Comment) (unknown) Persecutory voices/beliefs?: No Depression: No Depression Symptoms: Insomnia Substance abuse history and/or treatment for substance abuse?: No Suicide prevention information given to non-admitted patients: Not applicable  Risk to Others within the past 6 months Homicidal Ideation: No Does patient have any lifetime risk of violence toward others beyond the six months prior to admission? : No Thoughts of Harm to Others: No Current Homicidal Intent: No Current Homicidal Plan: No Access to Homicidal Means: No History of harm  to others?: No Assessment of Violence: None Noted Does patient have access to weapons?: No Criminal Charges Pending?: Yes Describe Pending Criminal Charges: assault, failure to appear Does patient have a court date: Yes Court Date: 05/17/16 Is patient on probation?: No  Psychosis Hallucinations: Visual, Auditory Delusions: Unspecified  Mental Status Report Appearance/Hygiene: Disheveled, Bizarre Eye Contact: Fair Motor Activity: Restlessness, Shuffling Speech: Logical/coherent, Rapid Level of Consciousness: Alert Mood: Anxious Affect: Anxious Anxiety Level: Minimal Thought Processes: Thought Blocking Judgement: Impaired Orientation: Not oriented Obsessive Compulsive Thoughts/Behaviors: None  Cognitive Functioning Concentration: Fair Memory: Remote Impaired, Recent Impaired IQ: Average Insight: Poor Impulse Control: Poor Appetite: Good Sleep: Decreased Total Hours of Sleep: 0 (pt has reportedly been awake for 5 days) Vegetative Symptoms: None  ADLScreening [facility] Assessment Services) Patient's cognitive ability adequate to safely complete daily activities?: Yes Patient able to express need for assistance with ADLs?: Yes Independently performs ADLs?: Yes (appropriate for developmental age)  Prior Inpatient Therapy Prior Inpatient Therapy: Yes Prior Therapy Dates: multiple Prior Therapy Facilty/Provider(s): [facility] Reason for Treatment: Bipolar  Prior Outpatient Therapy Prior Outpatient Therapy:  (unknown) Does patient have an ACCT team?: Unknown Does patient have Intensive In-House Services?  : Unknown Does patient have [facility] services? : Unknown Does patient have P4CC services?: Unknown  ADL  Screening (condition at time of admission) Patient's cognitive ability adequate to safely complete daily activities?: Yes Is the patient deaf or have difficulty hearing?: No Does the patient have difficulty seeing, even when wearing glasses/contacts?: No Does the patient have  difficulty concentrating, remembering, or making decisions?: No Patient able to express need for assistance with ADLs?: Yes Does the patient have difficulty dressing or bathing?: No Independently performs ADLs?: Yes (appropriate for developmental age) Does the patient have difficulty walking or climbing stairs?: No Weakness of Legs: None Weakness of Arms/Hands: None  Home Assistive Devices/Equipment Home Assistive Devices/Equipment: None    Abuse/Neglect Assessment (Assessment to be complete while patient is alone) Physical Abuse: Denies Verbal Abuse: Denies Sexual Abuse: Denies Exploitation of patient/patient's resources: Denies Self-Neglect: Denies     Merchant navy officer (For Healthcare) Does Patient Have a Medical Advance Directive?: No Would patient like information on creating a medical advance directive?: No - Patient declined    Additional Information 1:1 In Past 12 Months?: No CIRT Risk: No Elopement Risk: No Does patient have medical clearance?: Yes     Disposition:  Disposition Initial Assessment Completed for this Encounter: Yes Disposition of Patient: Inpatient treatment program Type of inpatient treatment program: Adult (per Donell Sievert, PA )  Karolee Ohs 05/04/2016 1:21 AM

## 2016-05-04 NOTE — ED Notes (Signed)
Pt up at nurses station, speech pressured, pt requesting an antibiotic, reports he has an STD. This nurse notified Jameson,NP.

## 2016-05-04 NOTE — ED Notes (Signed)
Pt sleeping at present, no distress noted, calm & cooperative at present.  Monitoring for safety, Q 15 min checks in effect.  [facility],.

## 2016-05-04 NOTE — ED Notes (Signed)
Report called to RN Brandon,[facility], Pending [facility] transport.

## 2016-05-04 NOTE — ED Notes (Signed)
Pt anxious, speech pressured,rapid, disorganized , laughing inappropriately at times. Pt not endorsing SI/HI. Special checks q 15 mins in place for safety. Video monitoring in place. Will continue to monitor.

## 2016-05-05 ENCOUNTER — Encounter (HOSPITAL_COMMUNITY): Payer: Self-pay | Admitting: *Deleted

## 2016-05-05 DIAGNOSIS — F1610 Hallucinogen abuse, uncomplicated: Secondary | ICD-10-CM | POA: Diagnosis present

## 2016-05-05 DIAGNOSIS — F3164 Bipolar disorder, current episode mixed, severe, with psychotic features: Secondary | ICD-10-CM | POA: Diagnosis present

## 2016-05-05 DIAGNOSIS — Z880 Allergy status to penicillin: Secondary | ICD-10-CM

## 2016-05-05 DIAGNOSIS — Z79899 Other long term (current) drug therapy: Secondary | ICD-10-CM

## 2016-05-05 DIAGNOSIS — Z91018 Allergy to other foods: Secondary | ICD-10-CM

## 2016-05-05 DIAGNOSIS — Z91013 Allergy to seafood: Secondary | ICD-10-CM

## 2016-05-05 MED ORDER — OLANZAPINE 10 MG PO TABS: 10.0000 mg | ORAL_TABLET | Freq: Three times a day (TID) | ORAL | Status: DC | PRN

## 2016-05-05 MED ORDER — CARBAMAZEPINE 200 MG PO TABS
100.0000 mg | ORAL_TABLET | Freq: Two times a day (BID) | ORAL | Status: DC
  Filled 2016-05-05 (×2): qty 0.5

## 2016-05-05 MED ORDER — OLANZAPINE 10 MG IM SOLR: 10.0000 mg | Freq: Three times a day (TID) | INTRAMUSCULAR | Status: DC | PRN

## 2016-05-05 MED ORDER — NICOTINE POLACRILEX 2 MG MT GUM
2.0000 mg | CHEWING_GUM | OROMUCOSAL | Status: DC | PRN
  Administered 2016-05-05 – 2016-05-07 (×2): 2 mg via ORAL
  Filled 2016-05-05 (×2): qty 1

## 2016-05-05 MED ORDER — LURASIDONE HCL 40 MG PO TABS
40.0000 mg | ORAL_TABLET | Freq: Every day | ORAL | Status: DC
  Administered 2016-05-05 – 2016-05-06 (×2): 40 mg via ORAL
  Filled 2016-05-05: qty 7
  Filled 2016-05-05 (×3): qty 1

## 2016-05-05 MED ORDER — CARBAMAZEPINE 100 MG PO CHEW
100.0000 mg | CHEWABLE_TABLET | Freq: Two times a day (BID) | ORAL | Status: DC
  Administered 2016-05-05 – 2016-05-07 (×4): 100 mg via ORAL
  Filled 2016-05-05: qty 14
  Filled 2016-05-05 (×6): qty 1
  Filled 2016-05-05: qty 14

## 2016-05-05 MED ORDER — NICOTINE POLACRILEX 2 MG MT GUM
CHEWING_GUM | OROMUCOSAL | Status: AC
  Administered 2016-05-05: 14:00:00
  Filled 2016-05-05: qty 1

## 2016-05-05 NOTE — Progress Notes (Signed)
Patient attended karaoke group this evening.  
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
## 2016-05-05 NOTE — Progress Notes (Signed)
Recreation Therapy Notes  INPATIENT RECREATION THERAPY ASSESSMENT  Patient Details Name: Raymond Church MRN: [id number] DOB: 1990/10/20 Today's Date: 05/05/2016  Patient Stressors: Family  Pt stated he was here because of himself.  Coping Skills:   Isolate, Avoidance, Exercise, Art/Dance, Talking, Music  Personal Challenges: Time Management, Trusting Others  Leisure Interests (2+):  Games - Board games, Individual - TV, Music - Write music, Individual - Other (Comment) (Write poetry, take a bath)  Awareness of Community Resources:  Yes  Community Resources:  Library, [employer]  Current Use: Yes  Patient Strengths:  Knowledge, respect  Patient Identified Areas of Improvement:  Paying attention, trusting  Current Recreation Participation:  Everyday  Patient Goal for Hospitalization:  "Hurry up and go home"  [place]  [place] Residence[place]  Current [place] (including self-harm):  No  Current HI:  No  Consent to Intern Participation: [name]   Caroll RancherMarjette Shakeitha Umbaugh, LRT/CTRS  Caroll RancherLindsay, Shanik [place] A 05/05/2016, 12:29 PM

## 2016-05-05 NOTE — Tx Team (Signed)
Interdisciplinary Treatment and Diagnostic Plan Update  05/05/2016 Time of Session: 4:10 PM  Raymond Church MRN: [id number]  Principal Diagnosis: Bipolar disorder, curr episode mixed, severe, with psychotic features ([place])  Secondary Diagnoses: Principal Problem:   Bipolar disorder, curr episode mixed, severe, with psychotic features ([place]) Active Problems:   Cocaine use disorder, severe, dependence ([place])   Cannabis use disorder, severe, dependence ([place])   MDMA abuse   Current Medications:  Current Facility-Administered Medications  Medication Dose Route Frequency Provider Last Rate Last Dose  . nicotine polacrilex (NICORETTE) 2 MG gum           . acetaminophen (TYLENOL) tablet 650 mg  650 mg Oral Q6H PRN Patrecia Pour, NP      . albuterol (PROVENTIL HFA;VENTOLIN HFA) 108 (90 Base) MCG/ACT inhaler 3-4 puff  3-4 puff Inhalation Q6H PRN Patrecia Pour, NP      . alum & mag hydroxide-simeth (MAALOX/MYLANTA) 200-200-20 MG/5ML suspension 30 mL  30 mL Oral Q4H PRN Patrecia Pour, NP      . carbamazepine (TEGRETOL) chewable tablet 100 mg  100 mg Oral BID PC Saramma Eappen, MD      . hydrOXYzine (ATARAX/VISTARIL) tablet 25 mg  25 mg Oral Q6H PRN Patrecia Pour, NP      . lurasidone (LATUDA) tablet 40 mg  40 mg Oral Q supper Saramma Eappen, MD      . magnesium hydroxide (MILK OF MAGNESIA) suspension 30 mL  30 mL Oral Daily PRN Patrecia Pour, NP      . nicotine (NICODERM CQ - dosed in mg/24 hours) patch 21 mg  21 mg Transdermal Daily Patrecia Pour, NP   21 mg at 05/05/16 0816  . OLANZapine (ZYPREXA) tablet 10 mg  10 mg Oral TID PRN Ursula Alert, MD       Or  . OLANZapine (ZYPREXA) injection 10 mg  10 mg Intramuscular TID PRN Ursula Alert, MD      . traZODone (DESYREL) tablet 50 mg  50 mg Oral QHS PRN Patrecia Pour, NP        PTA Medications: Prescriptions Prior to Admission  Medication Sig Dispense Refill Last Dose  . albuterol (PROVENTIL HFA;VENTOLIN HFA) 108 (90 Base) MCG/ACT inhaler  Inhale 3-4 puffs into the lungs every 6 (six) hours as needed for wheezing or shortness of breath. (Patient not taking: Reported on 05/04/2016) 1 Inhaler 1 Not Taking at Unknown time  . hydrOXYzine (ATARAX/VISTARIL) 25 MG tablet Take 1 tablet (25 mg total) by mouth every 6 (six) hours as needed (anxiety/agitation). (Patient not taking: Reported on 05/04/2016) 30 tablet 0 Not Taking at Unknown time  . Multiple Vitamin (MULTIVITAMIN WITH MINERALS) TABS tablet Take 1 tablet by mouth daily. For nutritional support (Patient not taking: Reported on 05/04/2016) 30 tablet 0 Not Taking at Unknown time  . nicotine (NICODERM CQ - DOSED IN MG/24 HOURS) 21 mg/24hr patch Place 1 patch (21 mg total) onto the skin daily. (Patient not taking: Reported on 05/04/2016) 28 patch 0 Not Taking at Unknown time  . traZODone (DESYREL) 50 MG tablet Take 1 tablet (50 mg total) by mouth at bedtime as needed for sleep. (Patient not taking: Reported on 05/04/2016) 14 tablet 0 Not Taking at Unknown time    Treatment Modalities: Medication Management, Group therapy, Case management,  1 to 1 session with clinician, Psychoeducation, Recreational therapy.   Physician Treatment Plan for Primary Diagnosis: Bipolar disorder, curr episode mixed, severe, with psychotic features ([place]) Long Term Goal(s):  Improvement in symptoms so as ready for discharge  Short Term Goals: Ability to identify and develop effective coping behaviors will improve Compliance with prescribed medications will improve Ability to identify triggers associated with substance abuse/mental health issues will improve Ability to demonstrate self-control will improve    Medication Management: Evaluate patient's response, side effects, and tolerance of medication regimen.  Therapeutic Interventions: 1 to 1 sessions, Unit Group sessions and Medication administration.  Evaluation of Outcomes: Progressing  Physician Treatment Plan for Secondary Diagnosis: Principal Problem:    Bipolar disorder, curr episode mixed, severe, with psychotic features ([place]) Active Problems:   Cocaine use disorder, severe, dependence ([place])   Cannabis use disorder, severe, dependence ([place])   MDMA abuse   Long Term Goal(s): Improvement in symptoms so as ready for discharge  Short Term Goals: Ability to identify and develop effective coping behaviors will improve Compliance with prescribed medications will improve Ability to identify triggers associated with substance abuse/mental health issues will improve Ability to demonstrate self-control will improve  Medication Management: Evaluate patient's response, side effects, and tolerance of medication regimen.  Therapeutic Interventions: 1 to 1 sessions, Unit Group sessions and Medication administration.  Evaluation of Outcomes: Progressing   RN Treatment Plan for Primary Diagnosis: Bipolar disorder, curr episode mixed, severe, with psychotic features ([place]) Long Term Goal(s): Knowledge of disease and therapeutic regimen to maintain health will improve  Short Term Goals: Ability to identify and develop effective coping behaviors will improve and Compliance with prescribed medications will improve  Medication Management: RN will administer medications as ordered by provider, will assess and evaluate patient's response and provide education to patient for prescribed medication. RN will report any adverse and/or side effects to prescribing provider.  Therapeutic Interventions: 1 on 1 counseling sessions, Psychoeducation, Medication administration, Evaluate responses to treatment, Monitor vital signs and CBGs as ordered, Perform/monitor CIWA, COWS, AIMS and Fall Risk screenings as ordered, Perform wound care treatments as ordered.  Evaluation of Outcomes: Progressing   Recreational Therapy Treatment Plan for Primary Diagnosis: Bipolar disorder, curr episode mixed, severe, with psychotic features ([place]) Long Term Goal(s): LTG- Patient will  participate in recreation therapy tx in at least 2 group sessions without prompting from LRT.  Short Term Goals: Patient will demonstrate increased ability to follow instructions, as demonstrated by ability to follow LRT instructions on first prompt during recreation therapy group sessions.   Treatment Modalities: Group and Pet Therapy  Therapeutic Interventions: Psychoeducation  Evaluation of Outcomes: Progressing   LCSW Treatment Plan for Primary Diagnosis: Bipolar disorder, curr episode mixed, severe, with psychotic features ([place]) Long Term Goal(s): Safe transition to appropriate next level of care at discharge, Engage patient in therapeutic group addressing interpersonal concerns.  Short Term Goals: Engage patient in aftercare planning with referrals and resources  Therapeutic Interventions: Assess for all discharge needs, 1 to 1 time with Social worker, Explore available resources and support systems, Assess for adequacy in community support network, Educate family and significant other(s) on suicide prevention, Complete Psychosocial Assessment, Interpersonal group therapy.  Evaluation of Outcomes: Met   Progress in Treatment: Attending groups: Yes Participating in groups: Yes Taking medication as prescribed: Yes Toleration medication: Yes, no side effects reported at this time Family/Significant other contact made: No Patient understands diagnosis: Yes AEB asking to get back on [place] Discussing patient identified problems/goals with staff: Yes Medical problems stabilized or resolved: Yes Denies suicidal/homicidal ideation: Yes Issues/concerns per patient self-inventory: None Other: [name]  New problem(s) identified: None identified at this time.  New Short Term/Long Term Goal(s): None identified at this time.   Discharge Plan or Barriers: return home, follow up outpt  Reason for Continuation of Hospitalization: Mood lability Hallucinations  Medication  stabilization   Estimated Length of Stay: 3-5 days  Attendees: Patient: 05/05/2016  4:10 PM  Physician: Ursula Alert, MD 05/05/2016  4:10 PM  Nursing: Hoy Register, RN 05/05/2016  4:10 PM  RN Care Manager: Lars Pinks, RN 05/05/2016  4:10 PM  Social Worker: Ripley Fraise 05/05/2016  4:10 PM  Recreational Therapist: Laretta Bolster  05/05/2016  4:10 PM  Other: Norberto Sorenson 05/05/2016  4:10 PM  Other:  05/05/2016  4:10 PM    Scribe for Treatment Team:  Roque Lias LCSW  05/05/2016 4:10 PM
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
## 2016-05-05 NOTE — Progress Notes (Signed)
Admission Note:  26 year old male who presents IVC, in no acute distress, due to being non-compliant with medications and Substance Abuse".  Patient appears lethargic on admission. Patient had to be awaken multiple times to complete admission process.  Patient was calm and cooperative with admission process. Patient currently denies SI and contracts for safety upon admission. Patient currently denies AVH stating "no, but I use to".  Patient states last AVH was "A long time ago. Probably like the last time I was here". Patient does not appear to be responding to internal stimuli during admission.  Patient reports that [facility].  Per report, patient had been using "molly" for 4 days and has been awake for 5 days.  Patient reports that he currently lives with his girlfriend and identifies his girlfriend as his support system.  Patient is currently unemployed and reports getting "let go" a couple of months ago.  [facility] would like to work on "sleep" and "staying up".  Skin was assessed and found to be clear of any abnormal marks apart from multiple tattoos.  Patient searched and no contraband found, POC and unit policies explained and understanding verbalized. Consents obtained. Food and fluids offered and accepted. Patient had no additional questions or concerns.

## 2016-05-05 NOTE — Plan of Care (Signed)
Problem: Safety: Goal: Periods of time without injury will increase Outcome: Progressing Client is safe on the unit AEB q15min safety checks.   

## 2016-05-05 NOTE — Progress Notes (Signed)
Recreation Therapy Notes  Date: 05/05/16 Time: 1000 Location: 500 Hall Dayroom  Group Topic: Leisure Education  Goal Area(s) Addresses:  Patient will identify positive leisure activities.  Patient will identify one positive benefit of participation in leisure activities.   Intervention: Construction paper, markers, scissors, glue sticks and magazines  Activity: Leisure PSA.  Patients were to create Church public service announcement to explain the benefits of recreation and leisure.  Patients were to also provide examples of various types of recreation and leisure.  Education:  Leisure Education, Discharge Planning  Education Outcome: Acknowledges education/In group clarification offered/Needs additional education  Clinical Observations/Feedback: Pt did not attend group.    Raymond Church, LRT/CTRS         Raymond Church 05/05/2016 12:16 PM 

## 2016-05-05 NOTE — Tx Team (Signed)
Initial Treatment Plan 05/05/2016 12:14 AM Raymond Church ZOX:[id number]    PATIENT STRESSORS: Medication change or noncompliance Substance abuse   PATIENT STRENGTHS: Communication skills Physical Health Supportive family/friends   PATIENT IDENTIFIED PROBLEMS: Psychosis  Substance Abuse  "Sleep"  "Staying up"               DISCHARGE CRITERIA:  Ability to meet basic life and health needs Improved stabilization in mood, thinking, and/or behavior Motivation to continue treatment in a less acute level of care Need for constant or close observation no longer present Verbal commitment to aftercare and medication compliance  PRELIMINARY DISCHARGE PLAN: Attend 12-step recovery group Outpatient therapy Return to previous living arrangement  PATIENT/FAMILY INVOLVEMENT: This treatment plan has been presented to and reviewed with the patient, Raymond DandyDanyel Church.  The patient and family have been given the opportunity to ask questions and make suggestions.  Carleene OverlieMiddleton, Eliam Snapp P, RN 05/05/2016, 12:14 AM

## 2016-05-05 NOTE — [facility] Suicide Risk Assessment (Signed)
[facility]   Nursing information obtained from:  Patient Demographic factors:  Male, Adolescent or young adult, Unemployed Current Mental Status:  NA Loss Factors:  Decrease in vocational status Historical Factors:  NA Risk Reduction Factors:  Responsible for children under 27 years of age, Living with another person, especially a relative, Positive social support  Total Time spent with patient: 30 minutes Principal Problem: Bipolar disorder, curr episode mixed, severe, with psychotic features (HCC) Diagnosis:   Patient Active Problem List   Diagnosis Date Noted  . Bipolar disorder, curr episode mixed, severe, with psychotic features (HCC) [F31.64] 05/05/2016  . 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 Cocaine use disorder, severe, dependence (HCC) [F14.20] 09/08/2014  . Cannabis use disorder, severe, dependence (HCC) [F12.20] 09/08/2014   Subjective Data: Please see H&P.   Continued Clinical Symptoms:  Alcohol Use Disorder Identification Test Final Score (AUDIT): 0 The "Alcohol Use Disorders Identification Test", Guidelines for Use in Primary Care, Second Edition.  World Science writerHealth Organization [facility](WHO). Score between 0-7:  no or low risk or alcohol related problems. Score between 8-15:  moderate risk of alcohol related problems. Score between 16-19:  high risk of alcohol related problems. Score 20 or above:  warrants further diagnostic evaluation for alcohol dependence and treatment.   CLINICAL FACTORS:   Alcohol/Substance Abuse/Dependencies Previous Psychiatric Diagnoses and Treatments   Musculoskeletal: Strength & Muscle Tone: within normal limits Gait & Station: normal Patient leans: [name]  Psychiatric Specialty Exam: Physical Exam  Review of Systems  Psychiatric/Behavioral: Positive for depression, hallucinations and substance abuse. The patient is nervous/anxious and has insomnia.   All other systems reviewed and are negative.   Blood pressure 113/68,  pulse 96, temperature 98 F (36.7 C), temperature source Oral, resp. rate 18, height 5\' 8"  (1.727 m), weight 68.9 kg (152 lb).Body mass index is 23.11 kg/m.          Please see H&P.                                                 COGNITIVE FEATURES THAT CONTRIBUTE TO RISK:  Closed-mindedness, Polarized thinking and Thought constriction (tunnel vision)    SUICIDE RISK:   Mild:  Suicidal ideation of limited frequency, intensity, duration, and specificity.  There are no identifiable plans, no associated intent, mild dysphoria and related symptoms, good self-control (both objective and subjective assessment), few other risk factors, and identifiable protective factors, including available and accessible social support.   PLAN OF CARE: Please see H&P.   I certify that inpatient services furnished can reasonably be expected to improve the patient's condition.  Zhuri Krass, MD 05/05/2016, 12:04 PM

## 2016-05-05 NOTE — B[facility] Group Notes (Signed)
B[facility] LCSW Group Therapy  05/05/2016 4:05 PM   Type of Therapy:  Group Therapy  Participation Level:  Active  Participation Quality:  Attentive  Affect:  Appropriate  Cognitive:  Appropriate  Insight:  Improving  Engagement in Therapy:  Engaged  Modes of Intervention:  Clarification, Education, Exploration and Socialization  Summary of Progress/Problems: Today's group focused on resilience. Raymond Church stayed the entire time, was engaged and active throughout.  Stated that he was told he would not graduate from school, and that he would be dead or locked up by this time.  "It's because of my faith and my stubbornness that I am doing as well as I am.  And I am a Pisces.  I believe in the power of positive thinking, and I believe that things will always get better."  Educated the group about the 5 P's-Positive Planning Prevents Poor Performance. Raymond Church, Raymond Church B 05/05/2016 , 4:05 PM

## 2016-05-05 NOTE — Progress Notes (Signed)
Patient ID: Corrie DandyDanyel Lietzke, male   DOB: Apr 15, 1991, 26 y.o.   MRN: [id number] D: Client visible on the unit has visitors "that's my GF and my little sister" client laughing and joking during the visit. Client reports "I'm ready to go home, my GF said she ready for me to come home to" When asked  [facility] client reports "being stupid, not listening" "she thought I needed a second to relax" Client reports upon discharge "I'll be taking my medicine" Client notes that referrals have been made for follow up care. A: Client encouraged to continue medications upon discharge. Medications reviewed, administered as ordered. Staff will monitor q2415min for safety. R:Client is safe on the unit, attended karaoke. "we had fun" "I sang, we turned up" "it was nice"

## 2016-05-05 NOTE — [facility] Suicide Risk Assessment (Signed)
[facility] INPATIENT:  Family/Significant Other Suicide Prevention Education  Suicide Prevention Education:  Education Completed; No one has been identified by the patient as the family member/significant other with whom the patient will be residing, and identified as the person(s) who will aid the patient in the event of a mental health crisis (suicidal ideations/suicide attempt).  With written consent from the patient, the family member/significant other has been provided the following suicide prevention education, prior to the and/or following the discharge of the patient.  The suicide prevention education provided includes the following:  Suicide risk factors  Suicide prevention and interventions  National Suicide Hotline telephone number  [facility] assessment telephone number  [facility] Emergency Assistance 911  [facility] and/or Residential [facility] Crisis [facility] telephone number  Request made of family/significant other to:  Remove weapons (e.g., guns, rifles, knives), all items previously/currently identified as safety concern.    Remove drugs/medications (over-the-counter, prescriptions, illicit drugs), all items previously/currently identified as a safety concern.  The family member/significant other verbalizes understanding of the suicide prevention education information provided.  The family member/significant other agrees to remove the items of safety concern listed above. The patient did not endorse SI at the time of admission, nor did the patient c/o SI during the stay here.  SPE not required.  Raymond RogueRodney Church Raymond Church 05/05/2016, 3:41 PM

## 2016-05-05 NOTE — Progress Notes (Signed)
DAR NOTE: Patient presents with anxious affect and mood.  Denies pain, auditory and visual hallucinations.  Rates depression at 0, hopelessness at 0, and anxiety at 0.  Maintained on routine safety checks.  Medications given as prescribed.  Support and encouragement offered as needed.  Attended group and participated.  Patient observed socializing with peers in the dayroom.  Offered no complaint.    

## 2016-05-06 LAB — COMPREHENSIVE METABOLIC PANEL
ALBUMIN: 3.7 g/dL (ref 3.5–5.0)
ALT: 58 U/L (ref 17–63)
AST: 33 U/L (ref 15–41)
Alkaline Phosphatase: 42 U/L (ref 38–126)
Anion gap: 7 (ref 5–15)
BUN: 12 mg/dL (ref 6–20)
CO2: 24 mmol/L (ref 22–32)
CREATININE: 0.84 mg/dL (ref 0.61–1.24)
Calcium: 8.6 mg/dL — ABNORMAL LOW (ref 8.9–10.3)
Chloride: 109 mmol/L (ref 101–111)
GFR calc Af Amer: >60 mL/min (ref >60)
Glucose, Bld: 110 mg/dL — ABNORMAL HIGH (ref 65–99)
POTASSIUM: 3.9 mmol/L (ref 3.5–5.1)
SODIUM: 140 mmol/L (ref 135–145)
Total Bilirubin: 0.3 mg/dL (ref 0.3–1.2)
Total Protein: 6.5 g/dL (ref 6.5–8.1)

## 2016-05-06 LAB — TSH: TSH: 0.431 u[IU]/mL (ref 0.350–4.500)

## 2016-05-06 LAB — CBC WITH DIFFERENTIAL/PLATELET
Basophils Absolute: 0.1 10*3/uL (ref 0.0–0.1)
Basophils Relative: 1 %
EOS PCT: 3 %
Eosinophils Absolute: 0.3 10*3/uL (ref 0.0–0.7)
HCT: 39.3 % (ref 39.0–52.0)
Hemoglobin: 13.4 g/dL (ref 13.0–17.0)
LYMPHS ABS: 3.7 10*3/uL (ref 0.7–4.0)
Lymphocytes Relative: 44 %
MCH: 30.7 pg (ref 26.0–34.0)
MCHC: 34.1 g/dL (ref 30.0–36.0)
MCV: 90.1 fL (ref 78.0–100.0)
MONO ABS: 0.6 10*3/uL (ref 0.1–1.0)
Monocytes Relative: 7 %
Neutro Abs: 3.7 10*3/uL (ref 1.7–7.7)
Neutrophils Relative %: 45 %
PLATELETS: 288 10*3/uL (ref 150–400)
RBC: 4.36 MIL/uL (ref 4.22–5.81)
RDW: 14.1 % (ref 11.5–15.5)
WBC: 8.4 10*3/uL (ref 4.0–10.5)

## 2016-05-06 LAB — LIPID PANEL
Cholesterol: 125 mg/dL (ref 0–200)
HDL: 42 mg/dL (ref >40)
LDL Cholesterol: 47 mg/dL (ref 0–99)
Total CHOL/HDL Ratio: 3 RATIO
Triglycerides: 180 mg/dL — ABNORMAL HIGH (ref <150)
VLDL: 36 mg/dL (ref 0–40)

## 2016-05-06 NOTE — Progress Notes (Signed)
  [facility] Dis[facility] :  Will you be returning to the same living situation after discharge:  Yes,  home with grandparents At discharge, do you have transportation home?: Yes,  friend or walk Do you have the ability to pay for your medications: Yes,  mental health  Release of information consent forms completed and in the chart;  Patient's signature needed at discharge.  Patient to Follow up at: Follow-up [employer]    [facility] Follow up.   Specialty:  Professional Counselor Why:  You will need to go to the walk-in clinic this week between 8:30 and 2:30 for your hospital follow up appointment Contact information: [facility] [address] [place] [address] [phone]           Next level of care provider has access to [facility] Link:no  Safety Planning and Suicide Prevention discussed: Yes,  yes  Have you used any form of tobacco in the last 30 days? (Cigarettes, Smokeless Tobacco, Cigars, and/or Pipes): Yes  Has patient been referred to the Quitline?: Patient refused referral  Patient has been referred for addiction treatment: Pt. refused referral  Ida RogueRodney B Julia Kulzer 05/06/2016, 11:06 AM

## 2016-05-06 NOTE — [facility] Group Notes (Signed)
[facility] LCSW Group Therapy  05/06/2016  1:05 PM  Type of Therapy:  Group therapy  Participation Level:  Active  Participation Quality:  Attentive  Affect:  Flat  Cognitive:  Oriented  Insight:  Limited  Engagement in Therapy:  Limited  Modes of Intervention:  Discussion, Socialization  Summary of Progress/Problems:  Chaplain was here to lead a group on themes of hope and courage. "Hope goes hand in hand with faith. I've been to the place where hope was all I had."  Cited our discussion yesterday about relilience.  "Hope equals resilience."  "Coming in here is like finding out that even though you thought your battery was low, it was actullay good." Daryel Geraldorth, Hurley Sobel B 05/06/2016 12:37 PM

## 2016-05-06 NOTE — Progress Notes (Signed)
Patient has denies all symptoms this shift.  Patient has been compliant with medications, attended groups engaged Clinical research associatewriter.  Patient is noted to have a bright affect.   Assess patient for safety, offer medications as prescribed engage patient in 1:1 staff talks,   Continue to monitor as prescribed.

## 2016-05-06 NOTE — Tx Team (Signed)
Interdisciplinary Treatment and Diagnostic Plan Update  05/06/2016 Time of Session: 11:04 AM  Raymond Church MRN: [id number]  Principal Diagnosis: Bipolar disorder, curr episode mixed, severe, with psychotic features ([place])  Secondary Diagnoses: Principal Problem:   Bipolar disorder, curr episode mixed, severe, with psychotic features ([place]) Active Problems:   Cocaine use disorder, severe, dependence ([place])   Cannabis use disorder, severe, dependence ([place])   MDMA abuse   Current Medications:  Current Facility-Administered Medications  Medication Dose Route Frequency Provider Last Rate Last Dose  . acetaminophen (TYLENOL) tablet 650 mg  650 mg Oral Q6H PRN Patrecia Pour, NP   650 mg at 05/05/16 2150  . albuterol (PROVENTIL HFA;VENTOLIN HFA) 108 (90 Base) MCG/ACT inhaler 3-4 puff  3-4 puff Inhalation Q6H PRN Patrecia Pour, NP   3 puff at 05/05/16 2153  . alum & mag hydroxide-simeth (MAALOX/MYLANTA) 200-200-20 MG/5ML suspension 30 mL  30 mL Oral Q4H PRN Patrecia Pour, NP      . carbamazepine (TEGRETOL) chewable tablet 100 mg  100 mg Oral BID PC Saramma Eappen, MD   100 mg at 05/06/16 0821  . hydrOXYzine (ATARAX/VISTARIL) tablet 25 mg  25 mg Oral Q6H PRN Patrecia Pour, NP   25 mg at 05/05/16 2150  . lurasidone (LATUDA) tablet 40 mg  40 mg Oral Q supper Ursula Alert, MD   40 mg at 05/05/16 1815  . magnesium hydroxide (MILK OF MAGNESIA) suspension 30 mL  30 mL Oral Daily PRN Patrecia Pour, NP      . nicotine polacrilex (NICORETTE) gum 2 mg  2 mg Oral PRN Ursula Alert, MD   2 mg at 05/05/16 1814  . OLANZapine (ZYPREXA) tablet 10 mg  10 mg Oral TID PRN Ursula Alert, MD       Or  . OLANZapine (ZYPREXA) injection 10 mg  10 mg Intramuscular TID PRN Ursula Alert, MD      . traZODone (DESYREL) tablet 50 mg  50 mg Oral QHS PRN Patrecia Pour, NP   50 mg at 05/05/16 2151    PTA Medications: Prescriptions Prior to Admission  Medication Sig Dispense Refill Last Dose  . albuterol (PROVENTIL  HFA;VENTOLIN HFA) 108 (90 Base) MCG/ACT inhaler Inhale 3-4 puffs into the lungs every 6 (six) hours as needed for wheezing or shortness of breath. (Patient not taking: Reported on 05/04/2016) 1 Inhaler 1 Not Taking at Unknown time  . hydrOXYzine (ATARAX/VISTARIL) 25 MG tablet Take 1 tablet (25 mg total) by mouth every 6 (six) hours as needed (anxiety/agitation). (Patient not taking: Reported on 05/04/2016) 30 tablet 0 Not Taking at Unknown time  . Multiple Vitamin (MULTIVITAMIN WITH MINERALS) TABS tablet Take 1 tablet by mouth daily. For nutritional support (Patient not taking: Reported on 05/04/2016) 30 tablet 0 Not Taking at Unknown time  . nicotine (NICODERM CQ - DOSED IN MG/24 HOURS) 21 mg/24hr patch Place 1 patch (21 mg total) onto the skin daily. (Patient not taking: Reported on 05/04/2016) 28 patch 0 Not Taking at Unknown time  . traZODone (DESYREL) 50 MG tablet Take 1 tablet (50 mg total) by mouth at bedtime as needed for sleep. (Patient not taking: Reported on 05/04/2016) 14 tablet 0 Not Taking at Unknown time    Treatment Modalities: Medication Management, Group therapy, Case management,  1 to 1 session with clinician, Psychoeducation, Recreational therapy.   Physician Treatment Plan for Primary Diagnosis: Bipolar disorder, curr episode mixed, severe, with psychotic features ([place]) Long Term Goal(s): Improvement in symptoms so as  ready for discharge  Short Term Goals: Ability to identify and develop effective coping behaviors will improve Compliance with prescribed medications will improve Ability to identify triggers associated with substance abuse/mental health issues will improve Ability to demonstrate self-control will improve    Medication Management: Evaluate patient's response, side effects, and tolerance of medication regimen.  Therapeutic Interventions: 1 to 1 sessions, Unit Group sessions and Medication administration.  Evaluation of Outcomes: Adequate for Discharge  Physician  Treatment Plan for Secondary Diagnosis: Principal Problem:   Bipolar disorder, curr episode mixed, severe, with psychotic features ([place]) Active Problems:   Cocaine use disorder, severe, dependence ([place])   Cannabis use disorder, severe, dependence ([place])   MDMA abuse   Long Term Goal(s): Improvement in symptoms so as ready for discharge  Short Term Goals: Ability to identify and develop effective coping behaviors will improve Compliance with prescribed medications will improve Ability to identify triggers associated with substance abuse/mental health issues will improve Ability to demonstrate self-control will improve  Medication Management: Evaluate patient's response, side effects, and tolerance of medication regimen.  Therapeutic Interventions: 1 to 1 sessions, Unit Group sessions and Medication administration.  Evaluation of Outcomes: Adequate for Discharge   RN Treatment Plan for Primary Diagnosis: Bipolar disorder, curr episode mixed, severe, with psychotic features ([place]) Long Term Goal(s): Knowledge of disease and therapeutic regimen to maintain health will improve  Short Term Goals: Ability to identify and develop effective coping behaviors will improve and Compliance with prescribed medications will improve  Medication Management: RN will administer medications as ordered by provider, will assess and evaluate patient's response and provide education to patient for prescribed medication. RN will report any adverse and/or side effects to prescribing provider.  Therapeutic Interventions: 1 on 1 counseling sessions, Psychoeducation, Medication administration, Evaluate responses to treatment, Monitor vital signs and CBGs as ordered, Perform/monitor CIWA, COWS, AIMS and Fall Risk screenings as ordered, Perform wound care treatments as ordered.  Evaluation of Outcomes: Adequate for Discharge   Recreational Therapy Treatment Plan for Primary Diagnosis: Bipolar disorder, curr episode  mixed, severe, with psychotic features ([place]) Long Term Goal(s): LTG- Patient will participate in recreation therapy tx in at least 2 group sessions without prompting from LRT.  Short Term Goals: Patient will demonstrate increased ability to follow instructions, as demonstrated by ability to follow LRT instructions on first prompt during recreation therapy group sessions.   Treatment Modalities: Group and Pet Therapy  Therapeutic Interventions: Psychoeducation  Evaluation of Outcomes: Progressing   LCSW Treatment Plan for Primary Diagnosis: Bipolar disorder, curr episode mixed, severe, with psychotic features ([place]) Long Term Goal(s): Safe transition to appropriate next level of care at discharge, Engage patient in therapeutic group addressing interpersonal concerns.  Short Term Goals: Engage patient in aftercare planning with referrals and resources  Therapeutic Interventions: Assess for all discharge needs, 1 to 1 time with Social worker, Explore available resources and support systems, Assess for adequacy in community support network, Educate family and significant other(s) on suicide prevention, Complete Psychosocial Assessment, Interpersonal group therapy.  Evaluation of Outcomes: Met   Progress in Treatment: Attending groups: Yes Participating in groups: Yes Taking medication as prescribed: Yes Toleration medication: Yes, no side effects reported at this time Family/Significant other contact made: No Patient understands diagnosis: Yes AEB asking to get back on [place] Discussing patient identified problems/goals with staff: Yes Medical problems stabilized or resolved: Yes Denies suicidal/homicidal ideation: Yes Issues/concerns per patient self-inventory: None Other: [name]  New problem(s) identified: None identified at this time.  New Short Term/Long Term Goal(s): None identified at this time.   Discharge Plan or Barriers: return home, follow up outpt  Reason for  Continuation of Hospitalization: Medication stabilization   Estimated Length of Stay: Likely d/c tomorrow  Attendees: Patient: 05/06/2016  11:04 AM  Physician: Ursula Alert, MD 05/06/2016  11:04 AM  Nursing: Lauretta Chester, RN 05/06/2016  11:04 AM  RN Care Manager: Lars Pinks, RN 05/06/2016  11:04 AM  Social Worker: Ripley Fraise 05/06/2016  11:04 AM  Recreational Therapist: Caoimhe Damron  05/06/2016  11:04 AM  Other: Norberto Sorenson 05/06/2016  11:04 AM  Other:  05/06/2016  11:04 AM    Scribe for Treatment Team:  Roque Lias LCSW  05/06/2016 11:04 AM

## 2016-05-06 NOTE — Progress Notes (Signed)
[facility] MD Progress Note  05/06/2016 1:26 PM Raymond Church  MRN:  [id number] Subjective:  Patient states " I am fine.'  Objective:Raymond Pettyis a  26 y.o.AA male, who is single, unemployed , who lives in [place] , who has a hx of Bipolar do , polysubstance abuse - who presented to ED, IVC ed for worsening sx- and being a danger to self as per EHR.  Patient seen and chart reviewed.Discussed patient with treatment team.  Pt seen as pleasant , hyperactive, anxious . Pt has been compliant with medications , denies ADRs. Continue to encourage and support. Pt is motivated to stay away from illict drugs and his insight has improved.    Principal Problem: Bipolar disorder, curr episode mixed, severe, with psychotic features ([place]) Diagnosis:   Patient Active Problem List   Diagnosis Date Noted  . Bipolar disorder, curr episode mixed, severe, with psychotic features ([place]) [F31.64] 05/05/2016  . 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 Cocaine use disorder, severe, dependence ([place]) [F14.20] 09/08/2014  . Cannabis use disorder, severe, dependence ([place]) [F12.20] 09/08/2014   Total Time spent with patient: 20 minutes  Past Psychiatric History: Please see H&P.   Past Medical History:  Past Medical History:  Diagnosis Date  . Asthma   . Bipolar disorder ([place])   . Depression    History reviewed. No pertinent surgical history. Family History:  Family History  Problem Relation Age of Onset  . Bipolar disorder Mother   . Drug abuse Mother    Family Psychiatric  History: Please see H&P.  Social History:  History  Alcohol Use No    Comment: occasional      History  Drug Use  . Types: Marijuana    Comment: Pt endorsed current use of  THC     Social History   Social History  . Marital status: Single    Spouse name: [name]  . Number of children: [name]  . Years of education: [name]   Social History Main Topics  . Smoking status: Current Every Day Smoker    Packs/day: 0.25    Years: 10.00    Types:  Cigarettes  . Smokeless tobacco: Never Used     Comment: refused  . Alcohol use No     Comment: occasional   . Drug use:     Types: Marijuana     Comment: Pt endorsed current use of  THC   . Sexual activity: Yes   Other Topics Concern  . None   Social History Narrative  . None   Additional Social History:                         Sleep: Fair  Appetite:  Fair  Current Medications: Current Facility-Administered Medications  Medication Dose Route Frequency Provider Last Rate Last Dose  . acetaminophen (TYLENOL) tablet 650 mg  650 mg Oral Q6H PRN Patrecia Pour, NP   650 mg at 05/05/16 2150  . albuterol (PROVENTIL HFA;VENTOLIN HFA) 108 (90 Base) MCG/ACT inhaler 3-4 puff  3-4 puff Inhalation Q6H PRN Patrecia Pour, NP   3 puff at 05/05/16 2153  . alum & mag hydroxide-simeth (MAALOX/MYLANTA) 200-200-20 MG/5ML suspension 30 mL  30 mL Oral Q4H PRN Patrecia Pour, NP      . carbamazepine (TEGRETOL) chewable tablet 100 mg  100 mg Oral BID PC Giorgia Wahler, MD   100 mg at 05/06/16 0821  . hydrOXYzine (ATARAX/VISTARIL) tablet 25 mg  25 mg Oral  Q6H PRN Patrecia Pour, NP   25 mg at 05/05/16 2150  . lurasidone (LATUDA) tablet 40 mg  40 mg Oral Q supper Ursula Alert, MD   40 mg at 05/05/16 1815  . magnesium hydroxide (MILK OF MAGNESIA) suspension 30 mL  30 mL Oral Daily PRN Patrecia Pour, NP      . nicotine polacrilex (NICORETTE) gum 2 mg  2 mg Oral PRN Ursula Alert, MD   2 mg at 05/05/16 1814  . OLANZapine (ZYPREXA) tablet 10 mg  10 mg Oral TID PRN Ursula Alert, MD       Or  . OLANZapine (ZYPREXA) injection 10 mg  10 mg Intramuscular TID PRN Ursula Alert, MD      . traZODone (DESYREL) tablet 50 mg  50 mg Oral QHS PRN Patrecia Pour, NP   50 mg at 05/05/16 2151    Lab Results:  Results for orders placed or performed during the hospital encounter of 05/04/16 (from the past 48 hour(s))  CBC with Differential/Platelet     Status: None   Collection Time: 05/06/16  6:41 AM   Result Value Ref Range   WBC 8.4 4.0 - 10.5 K/uL    Comment: WHITE COUNT CONFIRMED ON SMEAR   RBC 4.36 4.22 - 5.81 MIL/uL   Hemoglobin 13.4 13.0 - 17.0 g/dL   HCT 39.3 39.0 - 52.0 %   MCV 90.1 78.0 - 100.0 fL   MCH 30.7 26.0 - 34.0 pg   MCHC 34.1 30.0 - 36.0 g/dL   RDW 14.1 11.5 - 15.5 %   Platelets 288 150 - 400 K/uL    Comment: SPECIMEN CHECKED FOR CLOTS REPEATED TO VERIFY PLATELET COUNT CONFIRMED BY SMEAR    Neutrophils Relative % 45 %   Lymphocytes Relative 44 %   Monocytes Relative 7 %   Eosinophils Relative 3 %   Basophils Relative 1 %   Neutro Abs 3.7 1.7 - 7.7 K/uL   Lymphs Abs 3.7 0.7 - 4.0 K/uL   Monocytes Absolute 0.6 0.1 - 1.0 K/uL   Eosinophils Absolute 0.3 0.0 - 0.7 K/uL   Basophils Absolute 0.1 0.0 - 0.1 K/uL    Comment: Performed at [facility]  Comprehensive metabolic panel     Status: Abnormal   Collection Time: 05/06/16  6:41 AM  Result Value Ref Range   Sodium 140 135 - 145 mmol/L   Potassium 3.9 3.5 - 5.1 mmol/L   Chloride 109 101 - 111 mmol/L   CO2 24 22 - 32 mmol/L   Glucose, Bld 110 (H) 65 - 99 mg/dL   BUN 12 6 - 20 mg/dL   Creatinine, Ser 0.84 0.61 - 1.24 mg/dL   Calcium 8.6 (L) 8.9 - 10.3 mg/dL   Total Protein 6.5 6.5 - 8.1 g/dL   Albumin 3.7 3.5 - 5.0 g/dL   AST 33 15 - 41 U/L   ALT 58 17 - 63 U/L   Alkaline Phosphatase 42 38 - 126 U/L   Total Bilirubin 0.3 0.3 - 1.2 mg/dL   GFR calc non Af Amer >60 >60 mL/min   GFR calc Af Amer >60 >60 mL/min    Comment: (NOTE) The eGFR has been calculated using the CKD EPI equation. This calculation has not been validated in all clinical situations. eGFR's persistently <60 mL/min signify possible Chronic Kidney Disease.    Anion gap 7 5 - 15    Comment: Performed at [facility]  TSH  Status: None   Collection Time: 05/06/16  6:41 AM  Result Value Ref Range   TSH 0.431 0.350 - 4.500 uIU/mL    Comment: Performed by a 3rd Generation assay with a functional  sensitivity of <=0.01 uIU/mL. Performed at [facility]   Lipid panel     Status: Abnormal   Collection Time: 05/06/16  6:41 AM  Result Value Ref Range   Cholesterol 125 0 - 200 mg/dL   Triglycerides 180 (H) <150 mg/dL   HDL 42 >40 mg/dL   Total CHOL/HDL Ratio 3.0 RATIO   VLDL 36 0 - 40 mg/dL   LDL Cholesterol 47 0 - 99 mg/dL    Comment:        Total Cholesterol/HDL:CHD Risk Coronary Heart Disease Risk Table                     Men   Women  1/2 Average Risk   3.4   3.3  Average Risk       5.0   4.4  2 X Average Risk   9.6   7.1  3 X Average Risk  23.4   11.0        Use the calculated Patient Ratio above and the CHD Risk Table to determine the patient's CHD Risk.        ATP III CLASSIFICATION (LDL):  <100     mg/dL   Optimal  100-129  mg/dL   Near or Above                    Optimal  130-159  mg/dL   Borderline  160-189  mg/dL   High  >190     mg/dL   Very High Performed at [facility]     Blood Alcohol level:  Lab Results  Component Value Date   [facility] <5 05/03/2016   ETH <5 75/01/2584    Metabolic Disorder Labs: No results found for: HGBA1C, MPG No results found for: PROLACTIN Lab Results  Component Value Date   CHOL 125 05/06/2016   TRIG 180 (H) 05/06/2016   HDL 42 05/06/2016   CHOLHDL 3.0 05/06/2016   VLDL 36 05/06/2016   LDLCALC 47 05/06/2016    Physical Findings: AIMS: Facial and Oral Movements Muscles of Facial Expression: None, normal Lips and Perioral Area: None, normal Jaw: None, normal Tongue: None, normal,Extremity Movements Upper (arms, wrists, hands, fingers): None, normal Lower (legs, knees, ankles, toes): None, normal, Trunk Movements Neck, shoulders, hips: None, normal, Overall Severity Severity of abnormal movements (highest score from questions above): None, normal Incapacitation due to abnormal movements: None, normal Patient's awareness of abnormal movements (rate only patient's report): No Awareness, Dental  Status Current problems with teeth and/or dentures?: No Does patient usually wear dentures?: No  CIWA:    COWS:     Musculoskeletal: Strength & Muscle Tone: within normal limits Gait & Station: normal Patient leans: [name]  Psychiatric Specialty Exam: Physical Exam  Nursing note and vitals reviewed.   Review of Systems  Psychiatric/Behavioral: Positive for substance abuse. The patient is nervous/anxious.   All other systems reviewed and are negative.   Blood pressure 114/71, pulse 96, temperature 98.6 F (37 C), temperature source Oral, resp. rate 18, height '5\' 8"'$  (1.727 m), weight 68.9 kg (152 lb).Body mass index is 23.11 kg/m.  General Appearance: Fairly Groomed  Eye Contact:  Fair  Speech:  Normal Rate  Volume:  Normal  Mood:  Anxious  Affect:  Congruent  Thought Process:  Goal Directed and Descriptions of Associations: Circumstantial  Orientation:  Full (Time, Place, and Person)  Thought Content:  Logical  Suicidal Thoughts:  No  Homicidal Thoughts:  No  Memory:  Immediate;   Fair Recent;   Fair Remote;   Fair  Judgement:  Fair  Insight:  Fair  Psychomotor Activity:  Normal  Concentration:  Concentration: Fair and Attention Span: Fair  Recall:  [employer] of Knowledge:  Fair  Language:  Fair  Akathisia:  No  Handed:  Right  AIMS (if indicated):     Assets:  Desire for Improvement  ADL's:  Intact  Cognition:  WNL  Sleep:  Number of Hours: 6.75     Treatment Plan Summary: Patient today seen as less anxious , motivated to get help with his substance abuse. Continue treatment.  Bipolar disorder, curr episode mixed, severe, with psychotic features ([place]) improving  Will continue today 05/06/16 plan as below except where it is noted.   Daily contact with patient to assess and evaluate symptoms and progress in treatment and Medication management For psychosis: Latuda 40 mg po qpm.  For mood lability: Tegretol 100 mg po bid. Tegretol level on  05/07/16.  For insomnia: Trazodone 50 mg po qhs prn.  Will continue to monitor vitals ,medication compliance and treatment side effects while patient is here.   Will monitor for medical issues as well as call consult as needed.   Reviewed labs- cbc - wbc - elevated - repeat - wnl  , cmp - creatinine - elevated - repeat- wnl , AST/ALT - elevated - chronic - likely substance induced  ,UDS pending , tsh- wnl , lipid panel- wnl , pending hba1c, pl.  I have reviewed EKG for qtc - wnl.   CSW will continue  working on disposition.   Patient to participate in therapeutic milieu .      Secundino Ellithorpe, MD 05/06/2016, 1:26 PM

## 2016-05-06 NOTE — Progress Notes (Signed)
Recreation Therapy Notes  Date: 05/06/16 Time: 1000 Location: 500 Hall Dayroom  Group Topic: Stress Management  Goal Area(s) Addresses:  Patient will verbalize importance of using healthy stress management.  Patient will identify positive emotions associated with healthy stress management.   Behavioral Response: Engaged  Intervention: Stress Management  Activity :  Progressive Muscle Relaxation, [facility].  LRT introduced the stress management techniques of progressive muscle relaxation and guided imagery to patients.  LRT read scripts to allow patients to engage and participate in the techniques.  Patients were to follow along as LRT read the scripts to participate.    Education:  Stress Management, Discharge Planning.   Education Outcome: Acknowledges edcuation/In group clarification offered/Needs additional education  Clinical Observations/Feedback:  Pt stated some symptoms of stress are "not communicating or talking too much".  Pt was active and fully engaged.  Pt stated he enjoyed both techniques because "the muscle relaxation set up the guided imagery".   Caroll RancherMarjette Rilley Poulter, LRT/CTRS         Caroll RancherLindsay, Esmee Fallaw A 05/06/2016 12:07 PM

## 2016-05-07 DIAGNOSIS — Z813 Family history of other psychoactive substance abuse and dependence: Secondary | ICD-10-CM

## 2016-05-07 DIAGNOSIS — F3164 Bipolar disorder, current episode mixed, severe, with psychotic features: Principal | ICD-10-CM

## 2016-05-07 DIAGNOSIS — Z818 Family history of other mental and behavioral disorders: Secondary | ICD-10-CM

## 2016-05-07 DIAGNOSIS — F1910 Other psychoactive substance abuse, uncomplicated: Secondary | ICD-10-CM

## 2016-05-07 DIAGNOSIS — F17210 Nicotine dependence, cigarettes, uncomplicated: Secondary | ICD-10-CM

## 2016-05-07 DIAGNOSIS — F1420 Cocaine dependence, uncomplicated: Secondary | ICD-10-CM

## 2016-05-07 DIAGNOSIS — F1220 Cannabis dependence, uncomplicated: Secondary | ICD-10-CM

## 2016-05-07 DIAGNOSIS — F1510 Other stimulant abuse, uncomplicated: Secondary | ICD-10-CM

## 2016-05-07 LAB — CARBAMAZEPINE LEVEL, TOTAL: CARBAMAZEPINE LVL: 5.7 ug/mL (ref 4.0–12.0)

## 2016-05-07 LAB — PROLACTIN: PROLACTIN: 22.5 ng/mL — AB (ref 4.0–15.2)

## 2016-05-07 LAB — HEMOGLOBIN A1C
Hgb A1c MFr Bld: 5.2 % (ref 4.8–5.6)
MEAN PLASMA GLUCOSE: 103 mg/dL

## 2016-05-07 MED ORDER — CARBAMAZEPINE 100 MG PO CHEW: 100.0000 mg | CHEWABLE_TABLET | Freq: Two times a day (BID) | ORAL | 0 refills | Status: DC

## 2016-05-07 MED ORDER — NICOTINE POLACRILEX 2 MG MT GUM: 2.0000 mg | CHEWING_GUM | OROMUCOSAL | 0 refills | Status: DC | PRN

## 2016-05-07 MED ORDER — HYDROXYZINE HCL 25 MG PO TABS: 25.0000 mg | ORAL_TABLET | Freq: Four times a day (QID) | ORAL | 0 refills | Status: DC | PRN

## 2016-05-07 MED ORDER — TRAZODONE HCL 50 MG PO TABS: 50.0000 mg | ORAL_TABLET | Freq: Every evening | ORAL | 0 refills | Status: DC | PRN

## 2016-05-07 MED ORDER — ALBUTEROL SULFATE HFA 108 (90 BASE) MCG/ACT IN AERS: 2.0000 | INHALATION_SPRAY | Freq: Four times a day (QID) | RESPIRATORY_TRACT | 0 refills | Status: DC | PRN

## 2016-05-07 MED ORDER — LURASIDONE HCL 40 MG PO TABS: 40.0000 mg | ORAL_TABLET | Freq: Every day | ORAL | 0 refills | Status: DC

## 2016-05-07 NOTE — [facility] Suicide Risk Assessment (Signed)
[facility]   Principal Problem: Bipolar disorder, curr episode mixed, severe, with psychotic features [facility](HCC) Discharge Diagnoses:  Patient Active Problem List   Diagnosis Date Noted  . Bipolar disorder, curr episode mixed, severe, with psychotic features (HCC) [F31.64] 05/05/2016  . 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 Cocaine use disorder, severe, dependence (HCC) [F14.20] 09/08/2014  . Cannabis use disorder, severe, dependence (HCC) [F12.20] 09/08/2014    Total Time spent with patient: 30 minutes  Musculoskeletal: Strength & Muscle Tone: within normal limits Gait & Station: normal Patient leans: no lean  Psychiatric Specialty Exam: Review of Systems  Constitutional: Negative for fever.  Cardiovascular: Negative for chest pain.  Gastrointestinal: Negative for nausea.    Blood pressure (!) 103/49, pulse (!) 101, temperature 98.5 F (36.9 C), temperature source Oral, resp. rate 18, height 5\' 8"  (1.727 m), weight 68.9 kg (152 lb).Body mass index is 23.11 kg/m.  General Appearance: Casual  Eye Contact::  Fair  Speech:  Normal Rate409  Volume:  Normal  Mood:  Euthymic  Affect:  Congruent  Thought Process:  Goal Directed  Orientation:  Full (Time, Place, and Person)  Thought Content:  Logical  Suicidal Thoughts:  No  Homicidal Thoughts:  No  Memory:  Immediate;   Fair Recent;   Fair  Judgement:  Fair  Insight:  Fair  Psychomotor Activity:  Normal  Concentration:  Good  Recall:  Fis[employer]Fair  Fund of Knowledge:Fair  Language: Fair  Akathisia:  Negative  Handed:  Right  AIMS (if indicated):     Assets:  Desire for Improvement  Sleep:  Number of Hours: 6.75  Cognition: WNL  ADL's:  Intact   Mental Status Per Nursing Assessment::   On Admission:  NA  Demographic Factors:  Male and Low socioeconomic status  Loss Factors: Financial problems/change in socioeconomic status  Historical Factors: Impulsivity  Risk Reduction Factors:   Positive  social support and Positive coping skills or problem solving skills  Continued Clinical Symptoms:  Dysthymia Alcohol/Substance Abuse/Dependencies More than one psychiatric diagnosis  Dysthymia is improved. Feels guilt of using molly and plans to avoid it as it had made him confused and agitated  Cognitive Features That Contribute To Risk:  None    Suicide Risk:  Minimal: No identifiable suicidal ideation.  Patients presenting with no risk factors but with morbid ruminations; may be classified as minimal risk based on the severity of the depressive symptoms  Follow-up Information    [facility]   Specialty:  Professional Counselor Why:  You will need to go to the walk-in clinic this week between 8:30 and 2:30 for your hospital follow up appointment Contact information: [facility] [address] [place] [address] [phone]           Plan Of Care/Follow-up recommendations:  Activity:  as tolerated Diet:  regular  See Discharge summary for details Avoid using drugs or Molly. Insight improved Compliance with meds and appointments discussed  Thresa RossAKHTAR, Evalene Vath, MD 05/07/2016, 11:20 AM
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
## 2016-05-07 NOTE — BHH Group Notes (Signed)
B[facility] Group Notes:  (Clinical Social Work)  05/07/2016  11:15-12:00PM  Summary of Progress/Problems:   Today's process group involved patients discussing their feelings related to being hospitalized, as well as benefits they see to being in the hospital.  These were itemized on the whiteboard, and then the group brainstormed specific ways in which they could seek for those same benefits to happen when they discharge and go back home. The patient expressed a primary feeling about being hospitalized is "blessed" - he participated fully in group until called out of the room by RN to be discharged.  Type of Therapy:  Group Therapy - Process  Participation Level:  Active  Participation Quality:  Attentive and Sharing  Affect:  Appropriate  Cognitive:  Appropriate  Insight:  Engaged  Engagement in Therapy:  Engaged  Modes of Intervention:  Exploration, Discussion  Ambrose MantleMareida Grossman-Orr, LCSW 05/07/2016, 2:50 PM

## 2016-05-07 NOTE — Progress Notes (Signed)
Patient ID: Raymond Church, male   DOB: [birth year], 26 y.o.   MRN: [id number]  D: Patient observed watching TV and interacting well with peers on approach. 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 No behavioral issues noted.  A: Support and encouragement offered as needed. Medications administered as prescribed.  R: Patient cooperative and appropriate on unit. Will continue to monitor patient for safety and stability.

## 2016-05-07 NOTE — Progress Notes (Signed)
Adult Psychoeducational Group Note  Date:  05/07/2016 Time:  2:13 PM  Group Topic/Focus:  Coping With Mental Health Crisis:   The purpose of this group is to help patients identify strategies for coping with mental health crisis.  Group discusses possible causes of crisis and ways to manage them effectively.   Participation Level:  Did Not Attend  Participation Quality:  Raymond Church 05/07/2016, 2:13 PM

## 2016-05-07 NOTE — Progress Notes (Signed)
Patient discharged to lobby. Patient was stable and appreciative at that time. All papers, samples and prescriptions were given and valuables returned. Verbal understanding expressed. Denies SI/HI and A/VH. Patient given opportunity to express concerns and ask questions.  

## 2016-10-12 ENCOUNTER — Emergency Department (HOSPITAL_COMMUNITY)
Admission: EM | Admit: 2016-10-12 | Discharge: 2016-10-13 | Disposition: A | Discharge status: Home / Self Care | Payer: Self-pay | Attending: Emergency Medicine | Admitting: Emergency Medicine

## 2016-10-12 ENCOUNTER — Encounter (HOSPITAL_COMMUNITY): Payer: Self-pay | Admitting: Emergency Medicine

## 2016-10-12 ENCOUNTER — Emergency Department (HOSPITAL_COMMUNITY): Payer: Self-pay

## 2016-10-12 DIAGNOSIS — Z880 Allergy status to penicillin: Secondary | ICD-10-CM | POA: Insufficient documentation

## 2016-10-12 DIAGNOSIS — Z79899 Other long term (current) drug therapy: Secondary | ICD-10-CM | POA: Insufficient documentation

## 2016-10-12 DIAGNOSIS — Z91013 Allergy to seafood: Secondary | ICD-10-CM | POA: Insufficient documentation

## 2016-10-12 DIAGNOSIS — F17210 Nicotine dependence, cigarettes, uncomplicated: Secondary | ICD-10-CM | POA: Insufficient documentation

## 2016-10-12 DIAGNOSIS — J45909 Unspecified asthma, uncomplicated: Secondary | ICD-10-CM | POA: Insufficient documentation

## 2016-10-12 DIAGNOSIS — J4521 Mild intermittent asthma with (acute) exacerbation: Secondary | ICD-10-CM | POA: Insufficient documentation

## 2016-10-12 MED ORDER — IPRATROPIUM-ALBUTEROL 0.5-2.5 (3) MG/3ML IN SOLN
3.0000 mL | Freq: Once | RESPIRATORY_TRACT | Status: AC
  Administered 2016-10-13: 3 mL via RESPIRATORY_TRACT
  Filled 2016-10-12: qty 3

## 2016-10-12 MED ORDER — KETOROLAC TROMETHAMINE 60 MG/2ML IM SOLN
30.0000 mg | Freq: Once | INTRAMUSCULAR | Status: AC
  Administered 2016-10-12: 30 mg via INTRAMUSCULAR
  Filled 2016-10-12: qty 2

## 2016-10-12 MED ORDER — IPRATROPIUM-ALBUTEROL 0.5-2.5 (3) MG/3ML IN SOLN
3.0000 mL | Freq: Once | RESPIRATORY_TRACT | Status: AC
  Administered 2016-10-12: 3 mL via RESPIRATORY_TRACT
  Filled 2016-10-12: qty 3

## 2016-10-12 MED ORDER — PREDNISONE 20 MG PO TABS
40.0000 mg | ORAL_TABLET | Freq: Once | ORAL | Status: AC
  Administered 2016-10-12: 40 mg via ORAL
  Filled 2016-10-12: qty 2
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
## 2016-10-12 NOTE — ED Provider Notes (Signed)
[facility]-EMERGENCY DEPT Provider Note   CSN: [id number] Arrival date & time: 10/12/16  2133   By signing my name below, I, Freida Busman, attest that this documentation has been prepared under the direction and in the presence of [facility], PA-C. Electronically Signed: Freida Busman, Scribe. 10/12/2016. 10:44 PM.   History   Chief Complaint Chief Complaint  Patient presents with  . Shortness of Breath    The history is provided by the patient and medical records. No language interpreter was used.     HPI Comments:  Raymond Church is a 26 y.o. male with a history of asthma who presents to the Emergency Department complaining of intermittent episodes of SOB x 2-3 days. Pt reports associated productive cough with yellow sputum, congestion and fever with TMAX of 100.9. He notes h/o similar symptoms; states he was diagnosed with PNA at that time and is concerned he may have pneumonia again. Pt also notes using his home inhaler with some relief, however he ran out of his albuterol inhaler this AM. No chest pain, sore throat, abdominal pain or back pain.    Past Medical History:  Diagnosis Date  . Asthma   . Bipolar disorder (HCC)   . Depression     Patient Active Problem List   Diagnosis Date Noted  . Bipolar disorder, curr episode mixed, severe, with psychotic features (HCC) 05/05/2016  . 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 Polysubstance abuse 12/23/2015  . Cocaine use disorder, severe, dependence (HCC) 09/08/2014  . Cannabis use disorder, severe, dependence (HCC) 09/08/2014    History reviewed. No pertinent surgical history.     Home Medications    Prior to Admission medications   Medication Sig Start Date End Date Taking? Authorizing Provider  albuterol (PROVENTIL HFA;VENTOLIN HFA) 108 (90 Base) MCG/ACT inhaler Inhale 2 puffs into the lungs every 6 (six) hours as needed for wheezing or shortness of breath. 05/07/16   Withrow, Everardo All, [name]  carbamazepine (TEGRETOL) 100 MG chewable tablet  Chew 1 tablet (100 mg total) by mouth 2 (two) times daily after a meal. 05/07/16   Withrow, Everardo All, [name]  hydrOXYzine (ATARAX/VISTARIL) 25 MG tablet Take 1 tablet (25 mg total) by mouth every 6 (six) hours as needed (anxiety/agitation). 05/07/16   Withrow, Everardo All, [name]  lurasidone (LATUDA) 40 MG TABS tablet Take 1 tablet (40 mg total) by mouth daily with supper. 05/07/16   Withrow, Everardo All, [name]  nicotine polacrilex (NICORETTE) 2 MG gum Take 1 each (2 mg total) by mouth as needed for smoking cessation. 05/07/16   Withrow, Everardo All, [name]  predniSONE (DELTASONE) 20 MG tablet Take 2 tablets (40 mg total) by mouth daily. 10/13/16   Ward, Chase Picket, PA-C  traZODone (DESYREL) 50 MG tablet Take 1 tablet (50 mg total) by mouth at bedtime as needed for sleep. 05/07/16   Withrow, Everardo All, [name]    Family History Family History  Problem Relation Age of Onset  . Bipolar disorder Mother   . Drug abuse Mother     Social History Social History  Substance Use Topics  . Smoking status: Current Every Day Smoker    Packs/day: 0.25    Years: 10.00    Types: Cigarettes  . Smokeless tobacco: Never Used     Comment: refused  . Alcohol use Yes     Comment: occasional      Allergies   Shellfish allergy; Amoxicillin; Penicillins; and Mushroom extract complex   Review of Systems Review of Systems  Constitutional: Positive  for fever.  HENT: Positive for congestion. Negative for sore throat.   Respiratory: Positive for cough and shortness of breath.   Cardiovascular: Negative for chest pain.     Physical Exam Updated Vital Signs BP 107/60 (BP Location: Right Arm)   Pulse 100   Temp 98.4 F (36.9 C) (Oral)   Resp 13   SpO2 95%   Physical Exam  Constitutional: He is oriented to person, place, and time. He appears well-developed and well-nourished. No distress.  HENT:  Head: Normocephalic and atraumatic.  OP with erythema, no exudates or tonsillar hypertrophy. + nasal congestion with mucosal edema.   Neck:  Normal range of motion. Neck supple.  Cardiovascular: Regular rhythm and normal heart sounds.   Pulmonary/Chest: Effort normal. No respiratory distress. He has wheezes. He has no rales. He exhibits tenderness.  Abdominal: Soft. He exhibits no distension. There is no tenderness.  Musculoskeletal: Normal range of motion.  Neurological: He is alert and oriented to person, place, and time.  Skin: Skin is warm and dry. He is not diaphoretic.  Nursing note and vitals reviewed.    ED Treatments / Results  DIAGNOSTIC STUDIES:  Oxygen Saturation is 96% on RA, normal by my interpretation.    COORDINATION OF CARE:  10:42 PM Discussed treatment plan with pt at bedside and pt agreed to plan.  Labs (all labs ordered are listed, but only abnormal results are displayed) Labs Reviewed - No data to display  EKG  EKG Interpretation None       Radiology Dg Chest 2 View  Result Date: 10/12/2016 CLINICAL DATA:  Acute onset of shortness of breath and cough. Congestion. Initial encounter. EXAM: CHEST  2 VIEW COMPARISON:  Chest radiograph performed 04/11/2016 FINDINGS: The lungs are well-aerated and clear. There is no evidence of focal opacification, pleural effusion or pneumothorax. The heart is normal in size; the mediastinal contour is within normal limits. No acute osseous abnormalities are seen. IMPRESSION: No acute cardiopulmonary process seen. Electronically Signed   By: Roanna RaiderJeffery  Chang M.D.   On: 10/12/2016 22:27    Procedures Procedures (including critical care time)  Medications Ordered in ED Medications  ipratropium-albuterol (DUONEB) 0.5-2.5 (3) MG/3ML nebulizer solution 3 mL (3 mLs Nebulization Given 10/12/16 2310)  predniSONE (DELTASONE) tablet 40 mg (40 mg Oral Given 10/12/16 2302)  ketorolac (TORADOL) injection 30 mg (30 mg Intramuscular Given 10/12/16 2332)  ipratropium-albuterol (DUONEB) 0.5-2.5 (3) MG/3ML nebulizer solution 3 mL (3 mLs Nebulization Given 10/13/16 0015)  albuterol  (PROVENTIL HFA;VENTOLIN HFA) 108 (90 Base) MCG/ACT inhaler 2 puff (2 puffs Inhalation Given 10/13/16 0040)     Initial Impression / Assessment and Plan / ED Course  I have reviewed the triage vital signs and the nursing notes.  Pertinent labs & imaging results that were available during my care of the patient were reviewed by me and considered in my medical decision making (see chart for details).     Raymond Church is a 26 y.o. male who presents to ED for cough, congestion, shortness of breath. Hx of asthma and out of inhaler. Faint expiratory wheezing which resolved with prednisone and duoneb x 2. Home inhaler refilled. CXR negative. Patient feels better and comfortable with discharge to home. Rx for steroid burst. PCP follow up encouraged and resources for PCP's provided. Reasons to return to ER discussed. All questions answered.    Final Clinical Impressions(s) / ED Diagnoses   Final diagnoses:  Exacerbation of intermittent asthma, unspecified asthma severity    New Prescriptions Discharge  Medication List as of 10/13/2016 12:37 AM    START taking these medications   Details  predniSONE (DELTASONE) 20 MG tablet Take 2 tablets (40 mg total) by mouth daily., Starting Thu 10/13/2016, Print       I personally performed the services described in this documentation, which was scribed in my presence. The recorded information has been reviewed and is accurate.    Ward, Chase Picket, PA-C 10/13/16 0144    Gerhard Munch, MD 10/13/16 1230

## 2016-10-13 MED ORDER — PREDNISONE 20 MG PO TABS: 40.0000 mg | ORAL_TABLET | Freq: Every day | ORAL | 0 refills | Status: DC

## 2016-10-13 MED ORDER — ALBUTEROL SULFATE HFA 108 (90 BASE) MCG/ACT IN AERS
2.0000 | INHALATION_SPRAY | Freq: Once | RESPIRATORY_TRACT | Status: AC
  Administered 2016-10-13: 2 via RESPIRATORY_TRACT
  Filled 2016-10-13: qty 6.7

## 2016-10-13 NOTE — Discharge Instructions (Signed)
It was my pleasure taking care of you today!   Take prednisone daily starting tomorrow morning.  I have given you a refill of your home inhaler. Use this every four hours as needed.  Follow up with your primary care provider for further discussion of today's ER visit. If you do not have one, see the information below for helping finding a doctor in the area.   If you develop any new or worsening symptoms, including but not limited to fever, persistent vomiting, worsening shortness of breath or other symptoms that concern you, please return to the Emergency Department immediately.  To find a primary care or specialty doctor please call [phone] or [phone] to access "[place] Find a Doctor Service."  You may also go on the [facility] website at [email or web address]  There are also multiple [facility] practices throughout the [place] that are frequently accepting new patients. You may find a clinic that is close to your home and contact them.  [facility] - [address] [place] [phone]  [facility] in [place] (also locations in [place] and [place]) - [address] [place] [phone]  [facility] - [address] [place] [phone]

## 2017-01-11 ENCOUNTER — Encounter (HOSPITAL_COMMUNITY): Payer: Self-pay | Admitting: Emergency Medicine

## 2017-01-11 ENCOUNTER — Emergency Department (HOSPITAL_COMMUNITY)
Admission: EM | Admit: 2017-01-11 | Discharge: 2017-01-11 | Disposition: A | Discharge status: Home / Self Care | Payer: Self-pay | Attending: Emergency Medicine | Admitting: Emergency Medicine

## 2017-01-11 DIAGNOSIS — Z5321 Procedure and treatment not carried out due to patient leaving prior to being seen by health care provider: Secondary | ICD-10-CM | POA: Insufficient documentation

## 2017-01-11 DIAGNOSIS — R300 Dysuria: Secondary | ICD-10-CM | POA: Insufficient documentation

## 2017-01-11 LAB — COMPREHENSIVE METABOLIC PANEL
ALT: 19 U/L (ref 17–63)
ANION GAP: 7 (ref 5–15)
AST: 22 U/L (ref 15–41)
Albumin: 4.3 g/dL (ref 3.5–5.0)
Alkaline Phosphatase: 44 U/L (ref 38–126)
BUN: 12 mg/dL (ref 6–20)
CO2: 25 mmol/L (ref 22–32)
Calcium: 9.2 mg/dL (ref 8.9–10.3)
Chloride: 106 mmol/L (ref 101–111)
Creatinine, Ser: 1.07 mg/dL (ref 0.61–1.24)
GFR calc Af Amer: >60 mL/min (ref >60)
GFR calc non Af Amer: >60 mL/min (ref >60)
GLUCOSE: 102 mg/dL — AB (ref 65–99)
POTASSIUM: 4 mmol/L (ref 3.5–5.1)
SODIUM: 138 mmol/L (ref 135–145)
TOTAL PROTEIN: 7.6 g/dL (ref 6.5–8.1)
Total Bilirubin: 0.8 mg/dL (ref 0.3–1.2)

## 2017-01-11 LAB — CBC WITH DIFFERENTIAL/PLATELET
BASOS ABS: 0 10*3/uL (ref 0.0–0.1)
Basophils Relative: 0 %
Eosinophils Absolute: 0.1 10*3/uL (ref 0.0–0.7)
Eosinophils Relative: 1 %
HEMATOCRIT: 43.4 % (ref 39.0–52.0)
Hemoglobin: 14.8 g/dL (ref 13.0–17.0)
LYMPHS PCT: 24 %
Lymphs Abs: 2.5 10*3/uL (ref 0.7–4.0)
MCH: 31.5 pg (ref 26.0–34.0)
MCHC: 34.1 g/dL (ref 30.0–36.0)
MCV: 92.3 fL (ref 78.0–100.0)
MONO ABS: 0.7 10*3/uL (ref 0.1–1.0)
MONOS PCT: 7 %
NEUTROS ABS: 6.9 10*3/uL (ref 1.7–7.7)
Neutrophils Relative %: 68 %
Platelets: 301 10*3/uL (ref 150–400)
RBC: 4.7 MIL/uL (ref 4.22–5.81)
RDW: 13.7 % (ref 11.5–15.5)
WBC: 10.3 10*3/uL (ref 4.0–10.5)

## 2017-01-11 LAB — I-STAT CG4 LACTIC ACID, ED: LACTIC ACID, VENOUS: 1.79 mmol/L (ref 0.5–1.9)

## 2017-01-11 NOTE — ED Notes (Signed)
Called pt to re-assess vitals, did not answer.

## 2017-01-11 NOTE — ED Notes (Signed)
Called pt to re-assess vitals, pt did not answer.  

## 2017-01-11 NOTE — ED Triage Notes (Addendum)
Pt reports pain and swelling around penis x4 days, with painful urination. denies discharge

## 2017-01-13 ENCOUNTER — Emergency Department (HOSPITAL_COMMUNITY)
Admission: EM | Admit: 2017-01-13 | Discharge: 2017-01-13 | Disposition: A | Discharge status: Home / Self Care | Payer: Self-pay | Attending: Emergency Medicine | Admitting: Emergency Medicine

## 2017-01-13 ENCOUNTER — Encounter (HOSPITAL_COMMUNITY): Payer: Self-pay | Admitting: *Deleted

## 2017-01-13 DIAGNOSIS — F17210 Nicotine dependence, cigarettes, uncomplicated: Secondary | ICD-10-CM | POA: Insufficient documentation

## 2017-01-13 DIAGNOSIS — Z79899 Other long term (current) drug therapy: Secondary | ICD-10-CM | POA: Insufficient documentation

## 2017-01-13 DIAGNOSIS — N471 Phimosis: Secondary | ICD-10-CM

## 2017-01-13 DIAGNOSIS — J45909 Unspecified asthma, uncomplicated: Secondary | ICD-10-CM | POA: Insufficient documentation

## 2017-01-13 DIAGNOSIS — N481 Balanitis: Secondary | ICD-10-CM

## 2017-01-13 LAB — URINALYSIS, ROUTINE W REFLEX MICROSCOPIC
Bilirubin Urine: NEGATIVE
GLUCOSE, UA: NEGATIVE mg/dL
KETONES UR: NEGATIVE mg/dL
NITRITE: NEGATIVE
PROTEIN: NEGATIVE mg/dL
Specific Gravity, Urine: 1.018 (ref 1.005–1.030)
pH: 5 (ref 5.0–8.0)

## 2017-01-13 LAB — RAPID HIV SCREEN (HIV 1/2 AB+AG)
HIV 1/2 ANTIBODIES: NONREACTIVE
HIV-1 P24 Antigen - HIV24: NONREACTIVE

## 2017-01-13 MED ORDER — KETOROLAC TROMETHAMINE 60 MG/2ML IM SOLN
60.0000 mg | Freq: Once | INTRAMUSCULAR | Status: AC
  Administered 2017-01-13: 60 mg via INTRAMUSCULAR
  Filled 2017-01-13: qty 2

## 2017-01-13 MED ORDER — CEFTRIAXONE SODIUM 250 MG IJ SOLR
250.0000 mg | Freq: Once | INTRAMUSCULAR | Status: AC
  Administered 2017-01-13: 250 mg via INTRAMUSCULAR
  Filled 2017-01-13: qty 250

## 2017-01-13 MED ORDER — HYDROMORPHONE HCL 1 MG/ML IJ SOLN
1.0000 mg | Freq: Once | INTRAMUSCULAR | Status: DC
  Filled 2017-01-13: qty 1

## 2017-01-13 MED ORDER — AZITHROMYCIN 250 MG PO TABS
1000.0000 mg | ORAL_TABLET | Freq: Once | ORAL | Status: AC
  Administered 2017-01-13: 1000 mg via ORAL
  Filled 2017-01-13: qty 4

## 2017-01-13 MED ORDER — NYSTATIN 100000 UNIT/GM EX CREA: TOPICAL_CREAM | CUTANEOUS | 0 refills | Status: DC

## 2017-01-13 MED ORDER — STERILE WATER FOR INJECTION IJ SOLN
INTRAMUSCULAR | Status: AC
  Filled 2017-01-13: qty 10

## 2017-01-13 MED ORDER — HYDROMORPHONE HCL 1 MG/ML IJ SOLN
1.0000 mg | Freq: Once | INTRAMUSCULAR | Status: AC
  Administered 2017-01-13: 1 mg via INTRAMUSCULAR

## 2017-01-13 NOTE — ED Provider Notes (Signed)
[facility] DEPT Provider Note   CSN: [id number] Arrival date & time: 01/13/17  1444     History   Chief Complaint Chief Complaint  Patient presents with  . SEXUALLY TRANSMITTED DISEASE  . Groin Pain    HPI Raymond Church is a 26 y.o. male who presents with 4 days of penile swelling and sores on penis. Patient notes that partially 4 days ago, he began having some swelling of the foreskin, followed by sores that appeared on the glans penis. Patient reports that for the last 2 days he has not been able to retract the foreskin of the penis. He states that he still been able to urinate but has some burning with urination. Patient has not taken any medications for the pain. He has not seen a urologist. Patient reports that he frequently has swelling of his foreskin but that it returns after few days. Patient is currently sexually active with one partner. They do not use protection. He reports that his partner recently tested negative for STDs. Patient denies any fever, discharge from the penis, hematuria, testicular pain or swelling.  The history is provided by the patient.    Past Medical History:  Diagnosis Date  . Asthma   . Bipolar disorder (HCC)   . Depression     Patient Active Problem List   Diagnosis Date Noted  . Bipolar disorder, curr episode mixed, severe, with psychotic features (HCC) 05/05/2016  . 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 Polysubstance abuse 12/23/2015  . Cocaine use disorder, severe, dependence (HCC) 09/08/2014  . Cannabis use disorder, severe, dependence (HCC) 09/08/2014    History reviewed. No pertinent surgical history.     Home Medications    Prior to Admission medications   Medication Sig Start Date End Date Taking? Authorizing Provider  albuterol (PROVENTIL HFA;VENTOLIN HFA) 108 (90 Base) MCG/ACT inhaler Inhale 2 puffs into the lungs every 6 (six) hours as needed for wheezing or shortness of breath. 05/07/16   Withrow, Everardo All, [name]  carbamazepine  (TEGRETOL) 100 MG chewable tablet Chew 1 tablet (100 mg total) by mouth 2 (two) times daily after a meal. 05/07/16   Withrow, Everardo All, [name]  hydrOXYzine (ATARAX/VISTARIL) 25 MG tablet Take 1 tablet (25 mg total) by mouth every 6 (six) hours as needed (anxiety/agitation). 05/07/16   Withrow, Everardo All, [name]  lurasidone (LATUDA) 40 MG TABS tablet Take 1 tablet (40 mg total) by mouth daily with supper. 05/07/16   Withrow, Everardo All, [name]  nicotine polacrilex (NICORETTE) 2 MG gum Take 1 each (2 mg total) by mouth as needed for smoking cessation. 05/07/16   Withrow, Everardo All, [name]  nystatin cream (MYCOSTATIN) Apply to affected area 2 times daily 01/13/17   Graciella Freer A, PA-C  predniSONE (DELTASONE) 20 MG tablet Take 2 tablets (40 mg total) by mouth daily. 10/13/16   Ward, Chase Picket, PA-C  traZODone (DESYREL) 50 MG tablet Take 1 tablet (50 mg total) by mouth at bedtime as needed for sleep. 05/07/16   Withrow, Everardo All, [name]    Family History Family History  Problem Relation Age of Onset  . Bipolar disorder Mother   . Drug abuse Mother     Social History Social History  Substance Use Topics  . Smoking status: Current Every Day Smoker    Packs/day: 0.25    Years: 10.00    Types: Cigarettes  . Smokeless tobacco: Never Used     Comment: refused  . Alcohol use Yes     Comment: occasional  Allergies   Shellfish allergy; Amoxicillin; Penicillins; and Mushroom extract complex   Review of Systems Review of Systems  Constitutional: Negative for fever.  Genitourinary: Positive for dysuria, penile pain and penile swelling. Negative for hematuria, scrotal swelling and testicular pain.     Physical Exam Updated Vital Signs BP (!) 97/57 (BP Location: Right Arm)   Pulse 76   Temp 98.7 F (37.1 C) (Oral)   Resp 16   SpO2 98%   Physical Exam  Constitutional: He appears well-developed and well-nourished.  Sitting comfortably on examination table  HENT:  Head: Normocephalic and atraumatic.  Eyes:  Conjunctivae and EOM are normal. Right eye exhibits no discharge. Left eye exhibits no discharge. No scleral icterus.  Pulmonary/Chest: Effort normal.  Genitourinary: Uncircumcised.  Genitourinary Comments: The exam was performed with a chaperone present. Phimosis of the penis present. Cannot completely visualize the head of the penis secondary to phimosis.There is good cap refill present. He has some mild surrounding purulent sores to the glans penis. No ulcers, vesicles noted. No sores noted on the shaft of the penis.   Neurological: He is alert.  Skin: Skin is warm and dry.  Psychiatric: He has a normal mood and affect. His speech is normal and behavior is normal.  Nursing note and vitals reviewed.    ED Treatments / Results  Labs (all labs ordered are listed, but only abnormal results are displayed) Labs Reviewed  URINALYSIS, ROUTINE W REFLEX MICROSCOPIC - Abnormal; Notable for the following:       Result Value   Hgb urine dipstick SMALL (*)    Leukocytes, UA SMALL (*)    Bacteria, UA RARE (*)    Squamous Epithelial / LPF 0-5 (*)    All other components within normal limits  AEROBIC CULTURE (SUPERFICIAL SPECIMEN)  RAPID HIV SCREEN (HIV 1/2 AB+AG)  RPR  GC/CHLAMYDIA PROBE AMP ([place]) NOT AT [facility]    EKG  EKG Interpretation None       Radiology No results found.  Procedures Procedures (including critical care time)  Medications Ordered in ED Medications  HYDROmorphone (DILAUDID) injection 1 mg (1 mg Intramuscular Given 01/13/17 1656)  ketorolac (TORADOL) injection 60 mg (60 mg Intramuscular Given 01/13/17 1825)  cefTRIAXone (ROCEPHIN) injection 250 mg (250 mg Intramuscular Given 01/13/17 2002)  azithromycin (ZITHROMAX) tablet 1,000 mg (1,000 mg Oral Given 01/13/17 1957)     Initial Impression / Assessment and Plan / ED Course  I have reviewed the triage vital signs and the nursing notes.  Pertinent labs & imaging results that were available during my care of  the patient were reviewed by me and considered in my medical decision making (see chart for details).     26 year old male who presents with 4 days of penile pain and swelling. Patient is afebrile, non-toxic appearing, sitting comfortably on examination table. Vital signs reviewed and stable. Physical exam is consistent with phimosis. No evidence of epididymitis. Patient also has some purulent sores noted to the glans penis. No evidence of vesicles, rash, ulcers. History/physical exam are concerning for balanitis. Also consider STD given history of dysuria. History/physical exam are not concerning for paraphimosis or priapism. Initial labs ordered at triage. Analgesics provided in the department.  With a chaperone present, attempted to retract the foreskin but was unable to do so. Wound culture obtained. Given symptoms and complaints, we'll plan to treat for STD. Also plan to treat for balanitis. Will plan to consult urology for further evaluation.  Discussed with Dr. Vernie Ammons (Urology).  Agrees with plan for treatment. Will plan to see patient in the outpatient office next week.  Discussed with patient. He is agreeable to plan. Instructed him to follow up with urology as documented in the discharge paperwork. Conservative therapies discussed at home. Patient instructed to follow up with his primary care doctor next 2-4 days. Strict return precautions discussed. Patient expresses understanding and agreement to plan.    Final Clinical Impressions(s) / ED Diagnoses   Final diagnoses:  Balanitis  Phimosis    New Prescriptions Discharge Medication List as of 01/13/2017  8:31 PM    START taking these medications   Details  nystatin cream (MYCOSTATIN) Apply to affected area 2 times daily, Print         Rosana Hoes 01/14/17 0045    Doug Sou, MD 01/14/17 [phone]

## 2017-01-13 NOTE — ED Provider Notes (Signed)
Patient with phimosis and pain at tip of penis for the past 4 days. On examhe is in no distress alert nontoxic. Male genitalia. He hasphimosis present. Foreskin appears reddened swollen. There is yellowish drainage at the at the glans penis. I cannot completely visualize the glans penis due to phimosis. I'm unable to retract the foreskin.   Doug Sou, MD 01/13/17 [phone]

## 2017-01-13 NOTE — Discharge Instructions (Signed)
The test results with take 2-3 days to return. If there is an abnormal result, you will be notified. If you do not hear anything, that means the results were negative. You can also log on MyChart to see the results.  You can take Tylenol or Ibuprofen as directed for pain. You can alternate Tylenol and Ibuprofen every 4 hours. If you take Tylenol at 1pm, then you can take Ibuprofen at 5pm. Then you can take Tylenol again at 9pm.   Apply the cream as directed. Do not have sexual intercourse while the cream is on.   Follow-up with the referred Urologist. Call their office and arrange an appointment.  Do not have sexual intercourse until the condition clears.   Return to the Emergency Department for any worsening pain, inability to pee, blood in your urine, fever, redness/swelling of the penis or any other worsening or concerning symptoms.

## 2017-01-13 NOTE — ED Triage Notes (Signed)
Pt arrived by [facility], reports having sores on genitals causing pain. Reports swelling to penis, burning when urinating. No acute distress is noted at triage.

## 2017-01-13 NOTE — ED Provider Notes (Signed)
MSE was initiated and I personally evaluated the patient and placed orders (if any) at  5:46 PM on January 13, 2017.  The patient appears stable so that the remainder of the MSE may be completed by another provider.  26 year old uncircumcised male presenting with penile pain and swelling 4 days with associated dysuria. He reports that when he awoke this morning this his penis was "stuck" to his boxer shorts and a yellowish d/c was noted. He also reports painful sores to the glans penis. No h/o of similar. He reports that he has been unable to pull back the foreskin due to the swelling and pain throughout the penis. He denies fever or chills. He reports that he is currently sexually active with one male partner who was recently tested for STDs. All her tests were negative.    On physical exam, the penis is extremely TTP. No testicular pain or swelling. He has right-sided inguinal lymphadenopathy. Multiple small ulcerated lesions are present over the glans penis. No lesions noted on the shaft.   Dilaudid ordered for pain control. UA, GC/chlamydia, RPR, wet pep, and Rapid HIV pending. CBC and BMP from 2 days ago when the patient LWBS are available.    Frederik Pear A, PA-C 01/13/17 1746    Doug Sou, MD 01/14/17 [phone]

## 2017-01-14 LAB — RPR: RPR Ser Ql: NONREACTIVE

## 2017-01-16 LAB — AEROBIC CULTURE W GRAM STAIN (SUPERFICIAL SPECIMEN): Special Requests: NORMAL

## 2017-01-16 LAB — GC/CHLAMYDIA PROBE AMP (~~LOC~~) NOT AT ARMC
Chlamydia: NEGATIVE
NEISSERIA GONORRHEA: NEGATIVE

## 2017-01-16 LAB — AEROBIC CULTURE  (SUPERFICIAL SPECIMEN)

## 2018-10-19 IMAGING — CR DG FOOT 2V*L*
2 series · 2 of 2 positions shown · non-contrast
Comparison: None.

CLINICAL DATA: 25-year-old male thinks there is a piece of glass in
his foot at the level of the heel. Initial encounter.

EXAM:
LEFT FOOT - 2 VIEW

[x foot ap left]
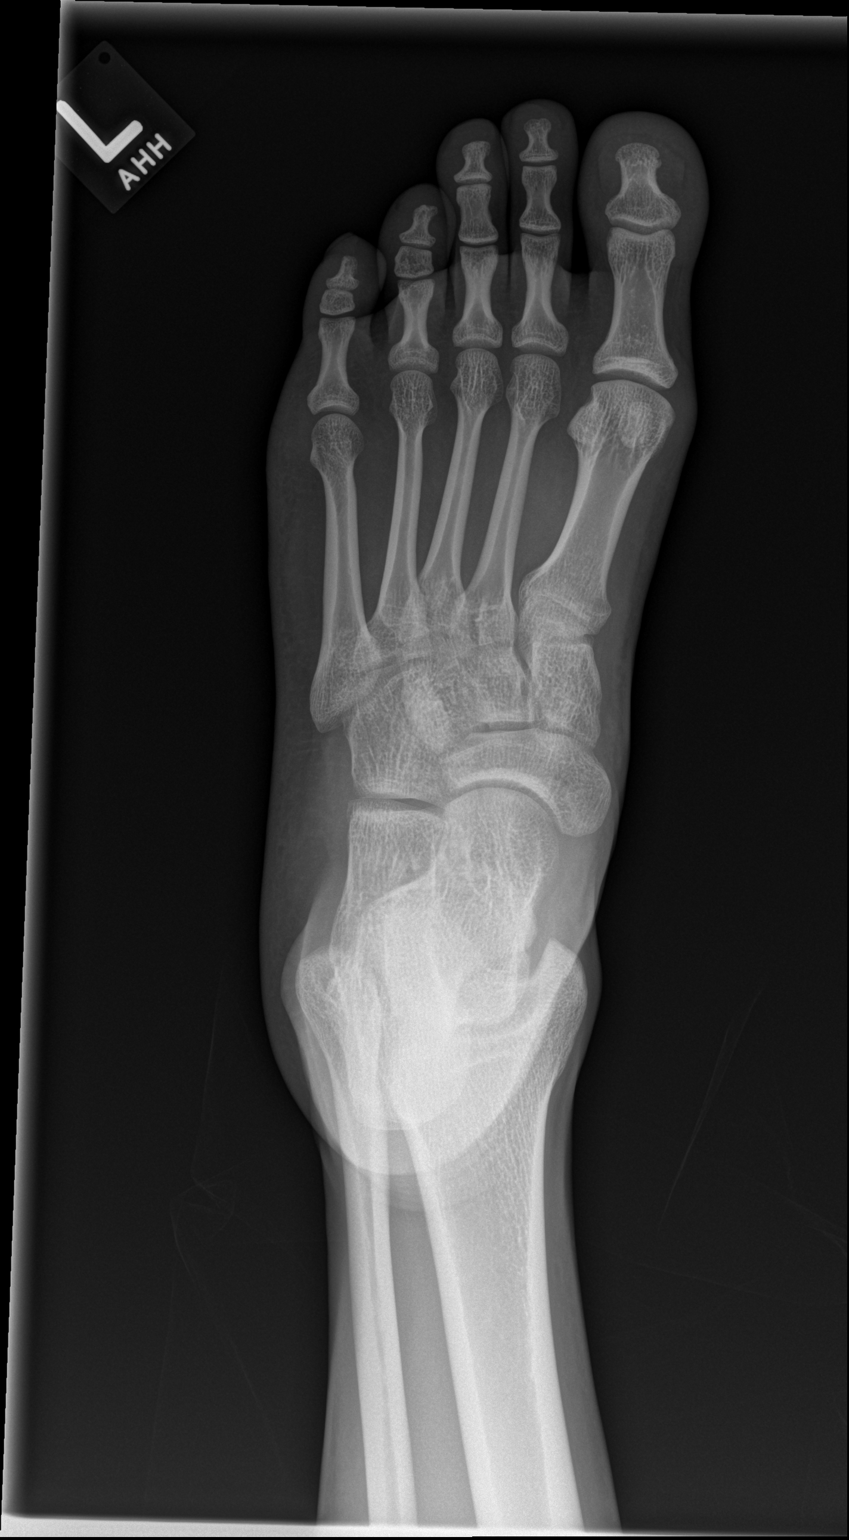

[x foot lat left]
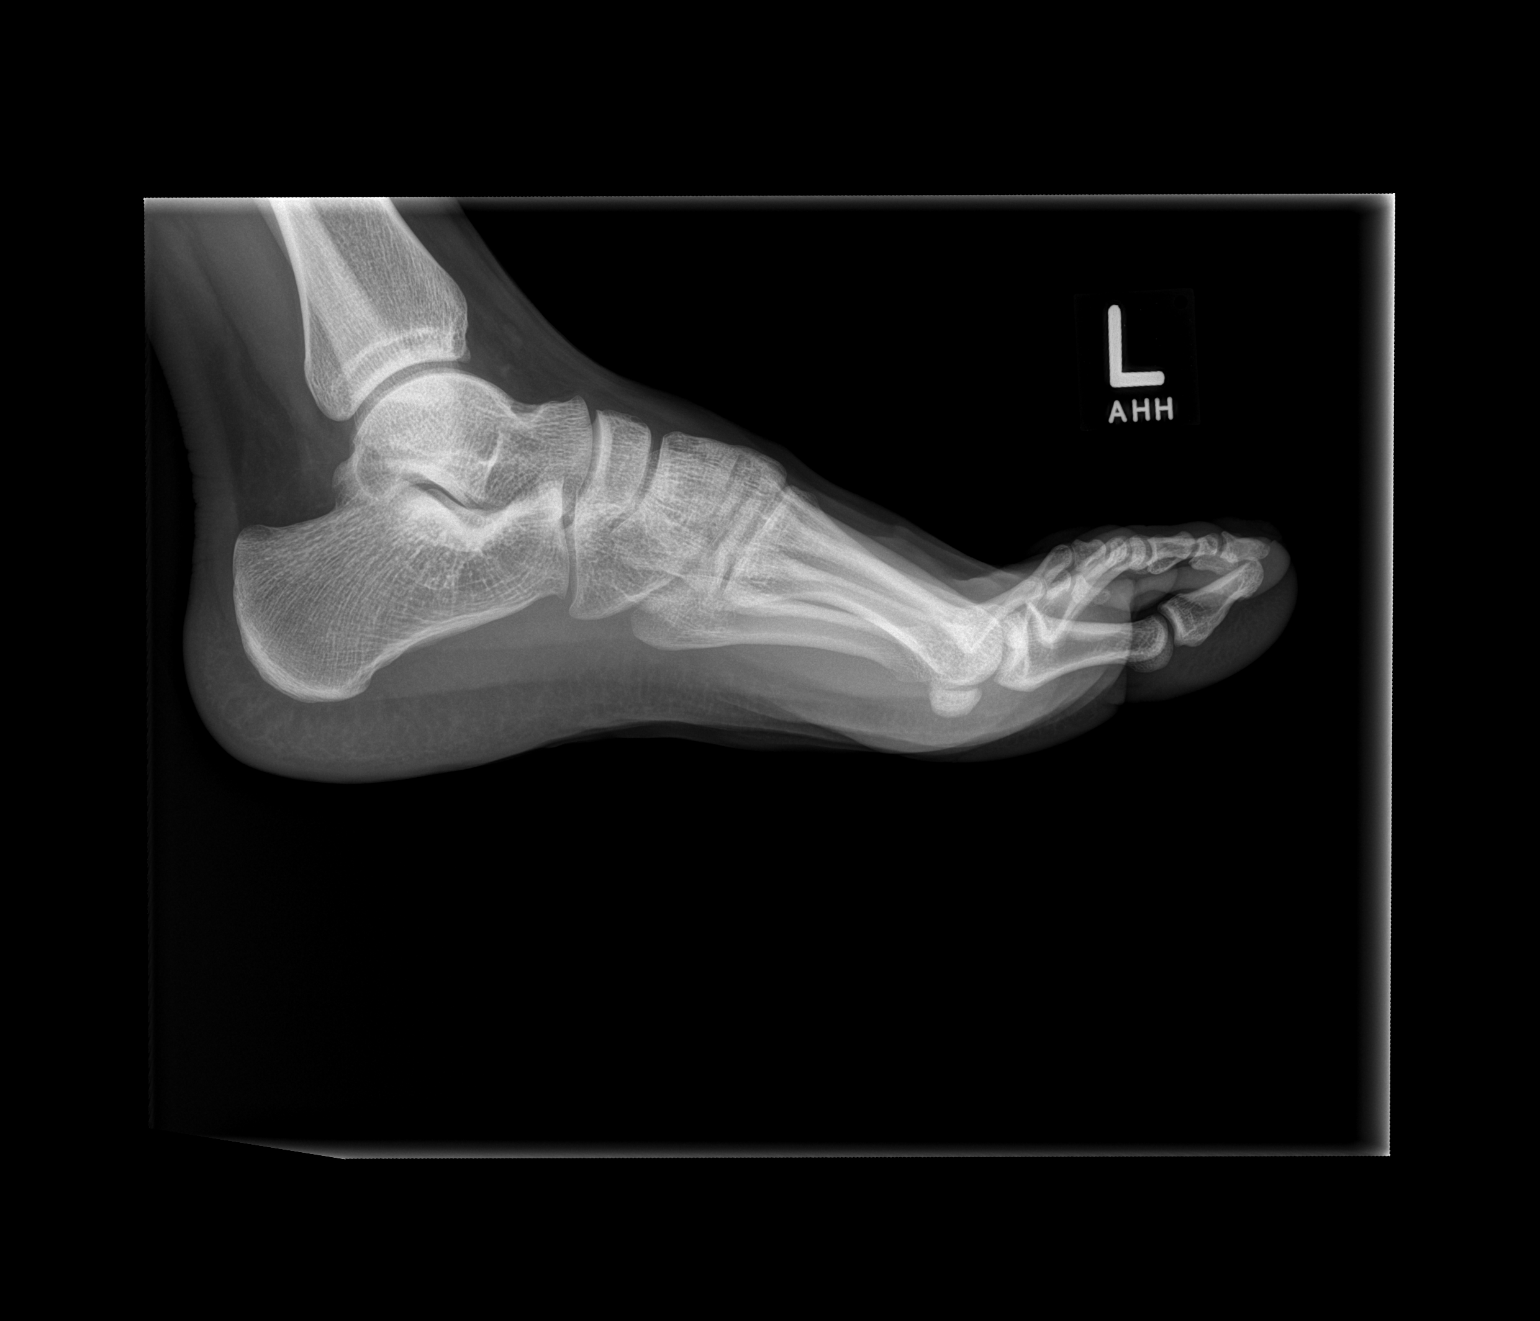

[2 of 2 positions shown; findings below may reference images not displayed]

FINDINGS: Bone mineralization is within normal limits. Calcaneus intact. Other
tarsal bones appear intact. Metatarsals and phalanges appear intact.
Joint spaces and alignment are normal.

No radiopaque foreign body identified.  No subcutaneous gas.
IMPRESSION: Negative radiographic appearance of the left foot. No radiopaque
foreign body identified, but note that not all glass is radiopaque.

## 2019-03-29 IMAGING — DX DG CHEST 2V
2 series · 2 of 2 positions shown · non-contrast
Comparison: Chest radiograph performed 04/11/2016

CLINICAL DATA: Acute onset of shortness of breath and cough.
Congestion. Initial encounter.

EXAM:
CHEST  2 VIEW

[w chest pa]
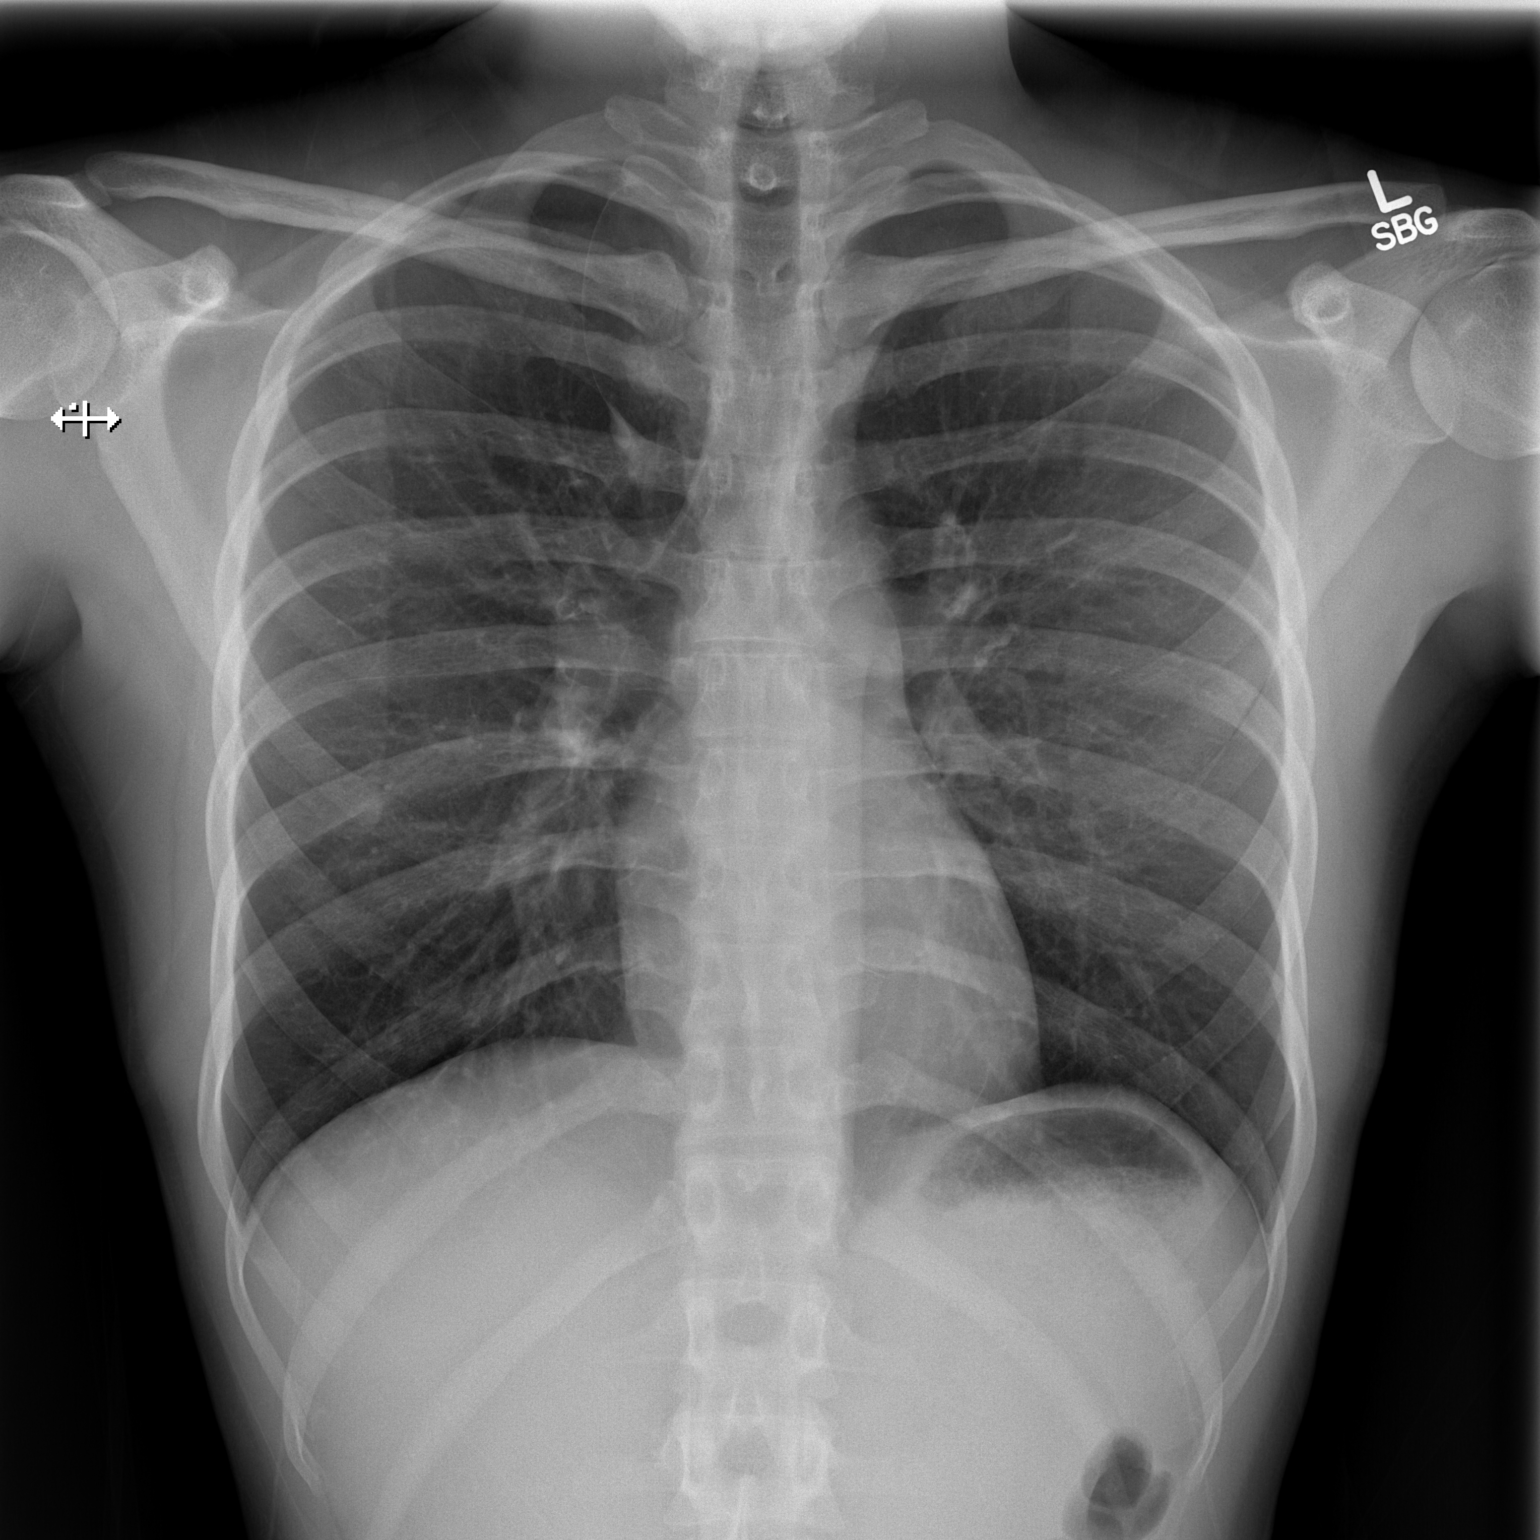

[w chest lat]
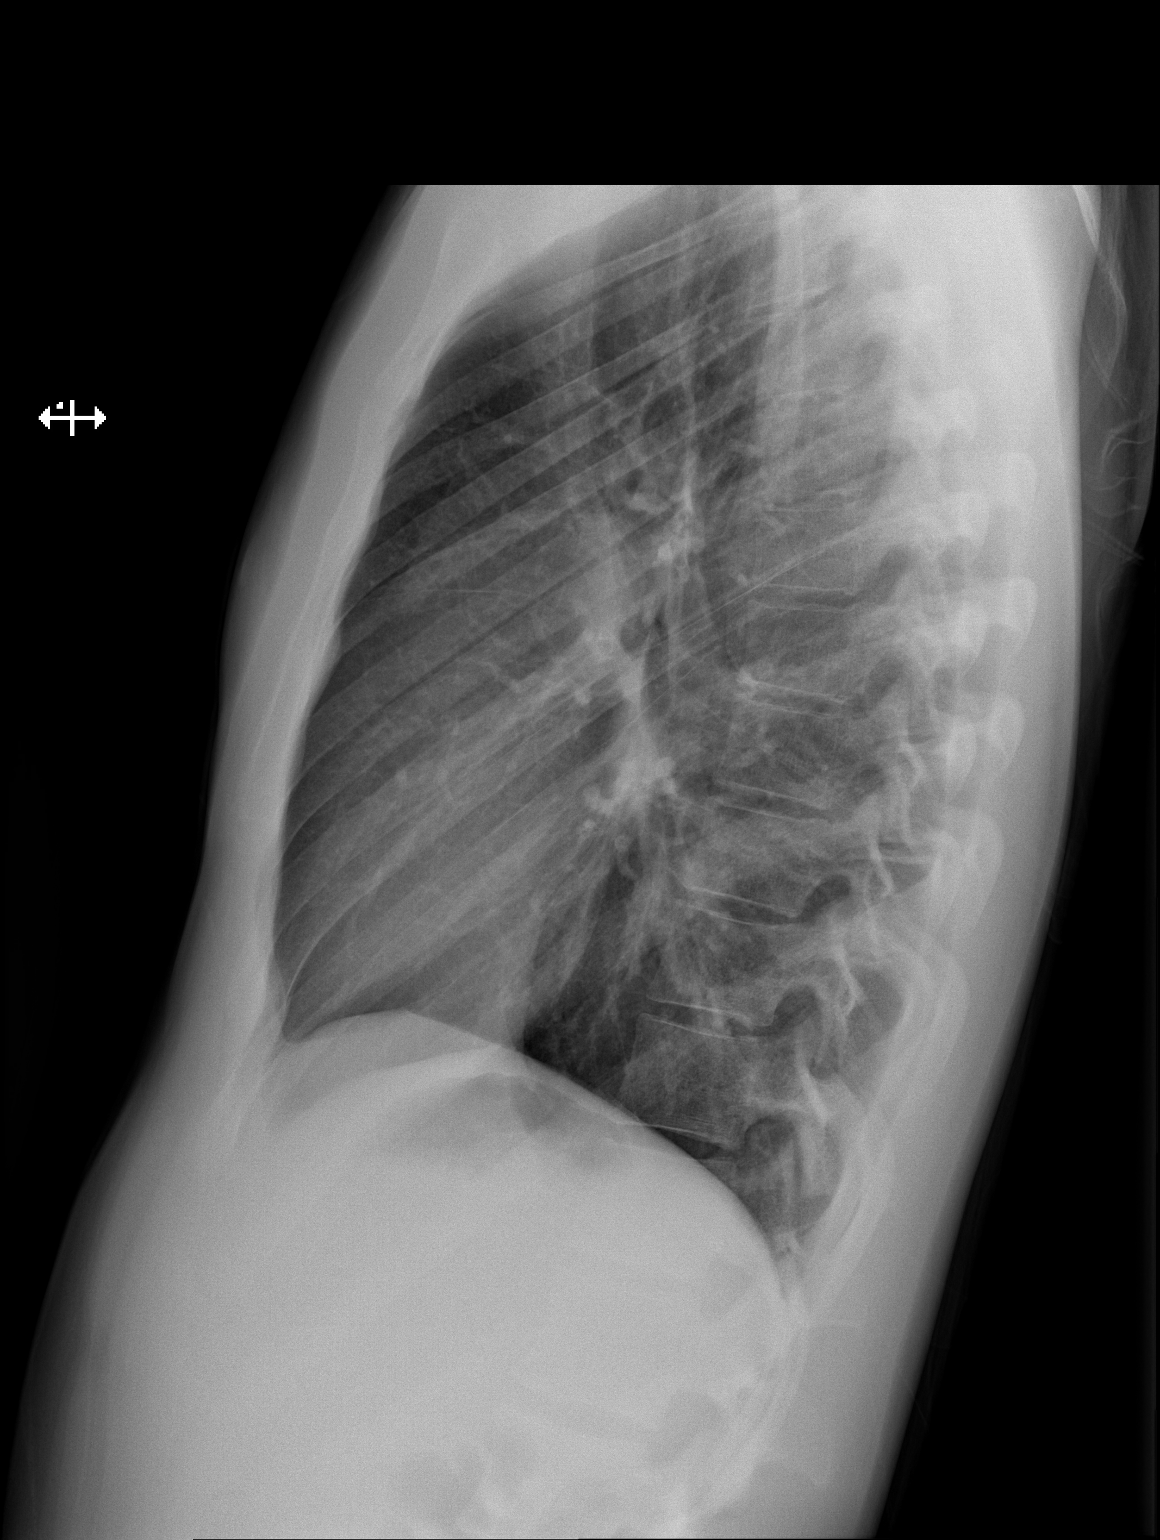

[2 of 2 positions shown; findings below may reference images not displayed]

FINDINGS: The lungs are well-aerated and clear. There is no evidence of focal
opacification, pleural effusion or pneumothorax.

The heart is normal in size; the mediastinal contour is within
normal limits. No acute osseous abnormalities are seen.
IMPRESSION: No acute cardiopulmonary process seen.

## 2022-05-01 ENCOUNTER — Other Ambulatory Visit: Payer: Self-pay

## 2022-05-01 ENCOUNTER — Emergency Department (HOSPITAL_COMMUNITY)
Admission: EM | Admit: 2022-05-01 | Discharge: 2022-05-02 | Disposition: A | Discharge status: Other Acute Inpatient Hospital | Payer: Commercial Managed Care - HMO | Attending: Emergency Medicine | Admitting: Emergency Medicine

## 2022-05-01 DIAGNOSIS — Z20822 Contact with and (suspected) exposure to covid-19: Secondary | ICD-10-CM | POA: Insufficient documentation

## 2022-05-01 DIAGNOSIS — J45909 Unspecified asthma, uncomplicated: Secondary | ICD-10-CM | POA: Insufficient documentation

## 2022-05-01 DIAGNOSIS — F29 Unspecified psychosis not due to a substance or known physiological condition: Secondary | ICD-10-CM | POA: Insufficient documentation

## 2022-05-01 DIAGNOSIS — R45851 Suicidal ideations: Secondary | ICD-10-CM

## 2022-05-01 DIAGNOSIS — Z7951 Long term (current) use of inhaled steroids: Secondary | ICD-10-CM | POA: Insufficient documentation

## 2022-05-01 LAB — RAPID URINE DRUG SCREEN, HOSP PERFORMED
Amphetamines: POSITIVE — AB
Barbiturates: NOT DETECTED
Benzodiazepines: NOT DETECTED
Cocaine: NOT DETECTED
Opiates: NOT DETECTED
Tetrahydrocannabinol: POSITIVE — AB

## 2022-05-01 LAB — CBC WITH DIFFERENTIAL/PLATELET
Abs Immature Granulocytes: 0.02 10*3/uL (ref 0.00–0.07)
Basophils Absolute: 0.1 10*3/uL (ref 0.0–0.1)
Basophils Relative: 1 %
Eosinophils Absolute: 0.7 10*3/uL — ABNORMAL HIGH (ref 0.0–0.5)
Eosinophils Relative: 6 %
HCT: 39.2 % (ref 39.0–52.0)
Hemoglobin: 12.7 g/dL — ABNORMAL LOW (ref 13.0–17.0)
Immature Granulocytes: 0 %
Lymphocytes Relative: 37 %
Lymphs Abs: 4.3 10*3/uL — ABNORMAL HIGH (ref 0.7–4.0)
MCH: 30.4 pg (ref 26.0–34.0)
MCHC: 32.4 g/dL (ref 30.0–36.0)
MCV: 93.8 fL (ref 80.0–100.0)
Monocytes Absolute: 1.1 10*3/uL — ABNORMAL HIGH (ref 0.1–1.0)
Monocytes Relative: 9 %
Neutro Abs: 5.5 10*3/uL (ref 1.7–7.7)
Neutrophils Relative %: 47 %
Platelets: 365 10*3/uL (ref 150–400)
RBC: 4.18 MIL/uL — ABNORMAL LOW (ref 4.22–5.81)
RDW: 15.9 % — ABNORMAL HIGH (ref 11.5–15.5)
WBC: 11.5 10*3/uL — ABNORMAL HIGH (ref 4.0–10.5)
nRBC: 0 % (ref 0.0–0.2)

## 2022-05-01 LAB — COMPREHENSIVE METABOLIC PANEL
ALT: 22 U/L (ref 0–44)
AST: 31 U/L (ref 15–41)
Albumin: 3.5 g/dL (ref 3.5–5.0)
Alkaline Phosphatase: 35 U/L — ABNORMAL LOW (ref 38–126)
Anion gap: 7 (ref 5–15)
BUN: 8 mg/dL (ref 6–20)
CO2: 28 mmol/L (ref 22–32)
Calcium: 8.9 mg/dL (ref 8.9–10.3)
Chloride: 105 mmol/L (ref 98–111)
Creatinine, Ser: 1 mg/dL (ref 0.61–1.24)
GFR, Estimated: >60 mL/min (ref >60)
Glucose, Bld: 89 mg/dL (ref 70–99)
Potassium: 3.3 mmol/L — ABNORMAL LOW (ref 3.5–5.1)
Sodium: 140 mmol/L (ref 135–145)
Total Bilirubin: 0.8 mg/dL (ref 0.3–1.2)
Total Protein: 7.1 g/dL (ref 6.5–8.1)

## 2022-05-01 LAB — ETHANOL: Alcohol, Ethyl (B): <10 mg/dL (ref <10)

## 2022-05-01 LAB — SALICYLATE LEVEL: Salicylate Lvl: <7 mg/dL — ABNORMAL LOW (ref 7.0–30.0)

## 2022-05-01 LAB — ACETAMINOPHEN LEVEL: Acetaminophen (Tylenol), Serum: <10 ug/mL — ABNORMAL LOW (ref 10–30)

## 2022-05-01 NOTE — ED Triage Notes (Signed)
Patient reports suicidal ideation with auditory and visual hallucinations this week .

## 2022-05-01 NOTE — ED Provider Triage Note (Signed)
Emergency Medicine Provider Triage Evaluation Note  Calder Oblinger , a 31 y.o. male  was evaluated in triage.  Pt complains of suicidal ideations with plan and auditory and visual hallucinations.  He reports that he has been off his medications for past 3 years.  He has been feeling more down and anxious over the past few days.  He reports his suicidal ideations are transitory, but is concerning for him.  He reports that he has voices telling him to kill himself. MJ use, otherwise denies any current use.   Review of Systems  Positive:  Negative:   Physical Exam  BP (!) 147/99   Pulse (!) 122   Temp 98.5 F (36.9 C) (Oral)   Resp 16   SpO2 100%  Gen:   Awake, no distress   Resp:  Normal effort  MSK:   Moves extremities without difficulty  Other:    Medical Decision Making  Medically screening exam initiated at 9:39 PM.  Appropriate orders placed.  Wynne Jury was informed that the remainder of the evaluation will be completed by another provider, this initial triage assessment does not replace that evaluation, and the importance of remaining in the ED until their evaluation is complete.  Medical clearance labs ordered   Achille Rich, Cordelia Poche 05/01/22 2140

## 2022-05-02 ENCOUNTER — Inpatient Hospital Stay (HOSPITAL_COMMUNITY)
Admission: AD | Admit: 2022-05-02 | Discharge: 2022-05-10 | DRG: 885 | Disposition: A | Discharge status: Home / Self Care | Payer: Commercial Managed Care - HMO | Source: Intra-hospital | Attending: Psychiatry | Admitting: Psychiatry

## 2022-05-02 ENCOUNTER — Encounter (HOSPITAL_COMMUNITY): Payer: Self-pay | Admitting: Nurse Practitioner

## 2022-05-02 DIAGNOSIS — G4700 Insomnia, unspecified: Secondary | ICD-10-CM | POA: Diagnosis present

## 2022-05-02 DIAGNOSIS — Z79899 Other long term (current) drug therapy: Secondary | ICD-10-CM | POA: Diagnosis not present

## 2022-05-02 DIAGNOSIS — Z560 Unemployment, unspecified: Secondary | ICD-10-CM

## 2022-05-02 DIAGNOSIS — Z880 Allergy status to penicillin: Secondary | ICD-10-CM | POA: Diagnosis not present

## 2022-05-02 DIAGNOSIS — Z91199 Patient's noncompliance with other medical treatment and regimen due to unspecified reason: Secondary | ICD-10-CM | POA: Diagnosis not present

## 2022-05-02 DIAGNOSIS — Z23 Encounter for immunization: Secondary | ICD-10-CM

## 2022-05-02 DIAGNOSIS — F3164 Bipolar disorder, current episode mixed, severe, with psychotic features: Secondary | ICD-10-CM | POA: Diagnosis present

## 2022-05-02 DIAGNOSIS — F1220 Cannabis dependence, uncomplicated: Secondary | ICD-10-CM | POA: Diagnosis present

## 2022-05-02 DIAGNOSIS — J45909 Unspecified asthma, uncomplicated: Secondary | ICD-10-CM | POA: Diagnosis present

## 2022-05-02 DIAGNOSIS — F17210 Nicotine dependence, cigarettes, uncomplicated: Secondary | ICD-10-CM | POA: Diagnosis present

## 2022-05-02 DIAGNOSIS — K5900 Constipation, unspecified: Secondary | ICD-10-CM | POA: Diagnosis present

## 2022-05-02 DIAGNOSIS — F1420 Cocaine dependence, uncomplicated: Secondary | ICD-10-CM | POA: Diagnosis present

## 2022-05-02 DIAGNOSIS — F4310 Post-traumatic stress disorder, unspecified: Secondary | ICD-10-CM | POA: Diagnosis present

## 2022-05-02 DIAGNOSIS — Z20822 Contact with and (suspected) exposure to covid-19: Secondary | ICD-10-CM | POA: Diagnosis present

## 2022-05-02 DIAGNOSIS — Z634 Disappearance and death of family member: Secondary | ICD-10-CM

## 2022-05-02 DIAGNOSIS — R45851 Suicidal ideations: Secondary | ICD-10-CM | POA: Diagnosis not present

## 2022-05-02 DIAGNOSIS — Z91148 Patient's other noncompliance with medication regimen for other reason: Secondary | ICD-10-CM

## 2022-05-02 DIAGNOSIS — F1910 Other psychoactive substance abuse, uncomplicated: Secondary | ICD-10-CM | POA: Diagnosis present

## 2022-05-02 DIAGNOSIS — Z818 Family history of other mental and behavioral disorders: Secondary | ICD-10-CM

## 2022-05-02 DIAGNOSIS — E876 Hypokalemia: Secondary | ICD-10-CM | POA: Diagnosis present

## 2022-05-02 DIAGNOSIS — F1610 Hallucinogen abuse, uncomplicated: Secondary | ICD-10-CM | POA: Diagnosis present

## 2022-05-02 HISTORY — DX: Pneumonia, unspecified organism: J18.9

## 2022-05-02 LAB — RESP PANEL BY RT-PCR (RSV, FLU A&B, COVID)  RVPGX2
Influenza A by PCR: NEGATIVE
Influenza B by PCR: NEGATIVE
Resp Syncytial Virus by PCR: NEGATIVE
SARS Coronavirus 2 by RT PCR: NEGATIVE

## 2022-05-02 MED ORDER — ONDANSETRON HCL 4 MG PO TABS
4.0000 mg | ORAL_TABLET | Freq: Three times a day (TID) | ORAL | Status: DC | PRN
  Administered 2022-05-08: 4 mg via ORAL
  Filled 2022-05-02: qty 1

## 2022-05-02 MED ORDER — NICOTINE 21 MG/24HR TD PT24
21.0000 mg | MEDICATED_PATCH | Freq: Every day | TRANSDERMAL | Status: DC
  Administered 2022-05-02: 21 mg via TRANSDERMAL
  Filled 2022-05-02: qty 1

## 2022-05-02 MED ORDER — TRAZODONE HCL 50 MG PO TABS: 50.0000 mg | ORAL_TABLET | Freq: Every evening | ORAL | Status: DC | PRN

## 2022-05-02 MED ORDER — IBUPROFEN 600 MG PO TABS
600.0000 mg | ORAL_TABLET | Freq: Three times a day (TID) | ORAL | Status: DC | PRN
  Administered 2022-05-02 – 2022-05-07 (×5): 600 mg via ORAL
  Filled 2022-05-02 (×5): qty 1

## 2022-05-02 MED ORDER — ALBUTEROL SULFATE HFA 108 (90 BASE) MCG/ACT IN AERS: 1.0000 | INHALATION_SPRAY | RESPIRATORY_TRACT | Status: DC | PRN

## 2022-05-02 MED ORDER — INFLUENZA VAC SPLIT QUAD 0.5 ML IM SUSY
0.5000 mL | PREFILLED_SYRINGE | INTRAMUSCULAR | Status: AC
  Administered 2022-05-04: 0.5 mL via INTRAMUSCULAR
  Filled 2022-05-02: qty 0.5

## 2022-05-02 MED ORDER — POTASSIUM CHLORIDE CRYS ER 20 MEQ PO TBCR
40.0000 meq | EXTENDED_RELEASE_TABLET | Freq: Once | ORAL | Status: AC
  Administered 2022-05-02: 40 meq via ORAL
  Filled 2022-05-02: qty 2

## 2022-05-02 MED ORDER — MELATONIN 5 MG PO TABS: 5.0000 mg | ORAL_TABLET | Freq: Every day | ORAL | Status: DC

## 2022-05-02 MED ORDER — MAGNESIUM HYDROXIDE 400 MG/5ML PO SUSP: 30.0000 mL | Freq: Every day | ORAL | Status: DC | PRN

## 2022-05-02 MED ORDER — MELATONIN 5 MG PO TABS
5.0000 mg | ORAL_TABLET | Freq: Every day | ORAL | Status: DC
  Administered 2022-05-03 – 2022-05-05 (×3): 5 mg via ORAL
  Filled 2022-05-02 (×6): qty 1

## 2022-05-02 MED ORDER — LURASIDONE HCL 40 MG PO TABS
40.0000 mg | ORAL_TABLET | Freq: Every evening | ORAL | Status: DC
  Administered 2022-05-02: 40 mg via ORAL
  Filled 2022-05-02 (×4): qty 1

## 2022-05-02 MED ORDER — IBUPROFEN 400 MG PO TABS: 600.0000 mg | ORAL_TABLET | Freq: Three times a day (TID) | ORAL | Status: DC | PRN

## 2022-05-02 MED ORDER — LURASIDONE HCL 40 MG PO TABS
40.0000 mg | ORAL_TABLET | Freq: Every evening | ORAL | Status: DC
  Filled 2022-05-02: qty 1

## 2022-05-02 MED ORDER — PNEUMOCOCCAL 20-VAL CONJ VACC 0.5 ML IM SUSY
0.5000 mL | PREFILLED_SYRINGE | INTRAMUSCULAR | Status: AC
  Administered 2022-05-04: 0.5 mL via INTRAMUSCULAR
  Filled 2022-05-02: qty 0.5

## 2022-05-02 MED ORDER — ONDANSETRON HCL 4 MG PO TABS: 4.0000 mg | ORAL_TABLET | Freq: Three times a day (TID) | ORAL | Status: DC | PRN

## 2022-05-02 MED ORDER — NICOTINE 21 MG/24HR TD PT24
21.0000 mg | MEDICATED_PATCH | Freq: Every day | TRANSDERMAL | Status: DC
  Administered 2022-05-03 – 2022-05-07 (×5): 21 mg via TRANSDERMAL
  Filled 2022-05-02 (×11): qty 1

## 2022-05-02 MED ORDER — ALUM & MAG HYDROXIDE-SIMETH 200-200-20 MG/5ML PO SUSP
30.0000 mL | Freq: Four times a day (QID) | ORAL | Status: DC | PRN
  Administered 2022-05-08: 30 mL via ORAL
  Filled 2022-05-02: qty 30

## 2022-05-02 MED ORDER — ALBUTEROL SULFATE HFA 108 (90 BASE) MCG/ACT IN AERS
1.0000 | INHALATION_SPRAY | RESPIRATORY_TRACT | Status: DC | PRN
  Administered 2022-05-03 – 2022-05-07 (×3): 2 via RESPIRATORY_TRACT
  Filled 2022-05-02: qty 6.7

## 2022-05-02 MED ORDER — ZIPRASIDONE MESYLATE 20 MG IM SOLR: 20.0000 mg | Freq: Two times a day (BID) | INTRAMUSCULAR | Status: DC | PRN

## 2022-05-02 MED ORDER — ALUM & MAG HYDROXIDE-SIMETH 200-200-20 MG/5ML PO SUSP: 30.0000 mL | Freq: Four times a day (QID) | ORAL | Status: DC | PRN

## 2022-05-02 NOTE — [facility] Group Notes (Signed)
Pt did not attend AA group  

## 2022-05-02 NOTE — ED Notes (Signed)
Pt three bags and blanket provided to safety transport

## 2022-05-02 NOTE — Progress Notes (Signed)
   05/02/22 2300  Psych Admission Type (Psych Patients Only)  Admission Status Voluntary  Psychosocial Assessment  Patient Complaints Depression  Eye Contact Brief  Facial Expression Animated  Affect Appropriate to circumstance  Speech Logical/coherent  Interaction Assertive  Motor Activity Restless  Appearance/Hygiene Disheveled  Behavior Characteristics Cooperative  Mood Depressed  Thought Process  Coherency WDL  Content WDL  Delusions None reported or observed  Perception WDL  Hallucination Auditory  Judgment Impaired  Confusion None  Danger to Self  Current suicidal ideation? Passive  Agreement Not to Harm Self Yes  Description of Agreement Verbal  Danger to Others  Danger to Others None reported or observed

## 2022-05-02 NOTE — Consult Note (Addendum)
[facility] ASSESSMENT   Reason for Consult:  Eval Referring Physician:  Percell Miller, [place] Patient Identification: Dhruvan Gullion MRN:  [id number] ED Chief Complaint: Suicidal ideation  Diagnosis:  Principal Problem:   Suicidal ideation   ED Assessment Time Calculation: Start Time: 1000 Stop Time: 1045 Total Time in Minutes (Assessment Completion): 45   HPI:   Dejour Dimalanta is a 32 y.o. male patient Who voluntarily presented to ED reporting suicidal ideation with plan to walk into traffic. He also reports auditory hallucinations of voices telling him to kill himself. He reports history of Bipolar disorder that was managed well with Latuda. However, he has been off all medications for a few months.   Subjective:   Patient seen this [facility] at [facility] for face-to-face evaluation.  Patient reports having a lot of stressors, and he had an aborted suicide attempt yesterday.  He was sitting on the curb of the street and reportedly hearing voices to walk out into traffic and kill himself.  He tells me he was very close to doing it but for some reason was able to ignore the voices, called his godmother, and then presented to the hospital.  Over the past 2 months patient has lost his grandmother, grandfather, and his father.  Reportedly the mother of his children left him for 6 months with their kids, and she has now returned and has taken full custody of the kids.  He has not seen his kids in multiple months.  He has been off his [place] for multiple months.  Tells me the only supportive person in his life right now is his godmother, he lives with her.  He has been unemployed for a few months, difficulty finding a job.  He denies alcohol use.  He does endorse meth and THC use.  Pt stated "I just don't have any reason to live right now. I miss my kids, I miss my family that died, I have no one." He is denying current AH. Denies VH. Denies homicidal ideations.   Pt goal is get back on latuda and start having  regular OP follow up and therapy again. Pt is able to engage in coherent and logical conversation. His speech is normal in rate and tone. He does not appear to be responding to internal stimuli, no concerns for psychosis/mania at this time. Thought content in intact. Will recommend for inpatient psychiatric treatment. Pt currently under review at [facility] [place] will fax out if no availability.   Past Psychiatric History:  Bipolar disorder, anxiety, depression, polysubstance abuse  Risk to Self or Others: Is the patient at risk to self? Yes Has the patient been a risk to self in the past 6 months? No Has the patient been a risk to self within the distant past? No Is the patient a risk to others? No Has the patient been a risk to others in the past 6 months? No Has the patient been a risk to others within the distant past? No  [place] Scale:  [place] ED from 05/01/2022 in [place] CATEGORY High Risk      Past Medical History:  Past Medical History:  Diagnosis Date   Asthma    Bipolar disorder ([place])    Depression    No past surgical history on file. Family History:  Family History  Problem Relation Age of Onset   Bipolar disorder Mother    Drug abuse Mother    Social History:  Social  History   Substance and Sexual Activity  Alcohol Use Yes   Comment: occasional      Social History   Substance and Sexual Activity  Drug Use Yes   Types: Marijuana, Methamphetamines   Comment: used molly last night    Social History   Socioeconomic History   Marital status: Single    Spouse name: Not on file   Number of children: Not on file   Years of education: Not on file   Highest education level: Not on file  Occupational History   Not on file  Tobacco Use   Smoking status: Every Day    Packs/day: 0.25    Years: 10.00    Total pack years: 2.50    Types: Cigarettes   Smokeless tobacco: Never   Tobacco comments:     refused  Substance and Sexual Activity   Alcohol use: Yes    Comment: occasional    Drug use: Yes    Types: Marijuana, Methamphetamines    Comment: used molly last night   Sexual activity: Yes  Other Topics Concern   Not on file  Social History Narrative   Not on file   Social Determinants of Health   Financial Resource Strain: Not on file  Food Insecurity: Not on file  Transportation Needs: Not on file  Physical Activity: Not on file  Stress: Not on file  Social Connections: Not on file   Additional Social History:    Allergies:   Allergies  Allergen Reactions   Shellfish Allergy Anaphylaxis   Amoxicillin Other (See Comments)    Has patient had a PCN reaction causing immediate rash, facial/tongue/throat swelling, SOB or lightheadedness with hypotension: No Has patient had a PCN reaction causing severe rash involving mucus membranes or skin necrosis: No Has patient had a PCN reaction that required hospitalization No Has patient had a PCN reaction occurring within the last 10 years: No If all of the above answers are "NO", then may proceed with Cephalosporin use.   Penicillins Other (See Comments)    Has patient had a PCN reaction causing immediate rash, facial/tongue/throat swelling, SOB or lightheadedness with hypotension: No Has patient had a PCN reaction causing severe rash involving mucus membranes or skin necrosis: No Has patient had a PCN reaction that required hospitalization No Has patient had a PCN reaction occurring within the last 10 years: No If all of the above answers are "NO", then may proceed with Cephalosporin use.   Mushroom Extract Complex Nausea And Vomiting    Labs:  Results for orders placed or performed during the hospital encounter of 05/01/22 (from the past 48 hour(s))  Comprehensive metabolic panel     Status: Abnormal   Collection Time: 05/01/22  9:56 PM  Result Value Ref Range   Sodium 140 135 - 145 mmol/L   Potassium 3.3 (L) 3.5 - 5.1  mmol/L   Chloride 105 98 - 111 mmol/L   CO2 28 22 - 32 mmol/L   Glucose, Bld 89 70 - 99 mg/dL    Comment: Glucose reference range applies only to samples taken after fasting for at least 8 hours.   BUN 8 6 - 20 mg/dL   Creatinine, Ser 1.00 0.61 - 1.24 mg/dL   Calcium 8.9 8.9 - 10.3 mg/dL   Total Protein 7.1 6.5 - 8.1 g/dL   Albumin 3.5 3.5 - 5.0 g/dL   AST 31 15 - 41 U/L   ALT 22 0 - 44 U/L   Alkaline Phosphatase 35 (  L) 38 - 126 U/L   Total Bilirubin 0.8 0.3 - 1.2 mg/dL   GFR, Estimated >60 >60 mL/min    Comment: (NOTE) Calculated using the CKD-EPI Creatinine Equation (2021)    Anion gap 7 5 - 15    Comment: Performed at [facility] [address], [place], [place] [address]  Ethanol     Status: None   Collection Time: 05/01/22  9:56 PM  Result Value Ref Range   Alcohol, Ethyl (B) <10 <10 mg/dL    Comment: (NOTE) Lowest detectable limit for serum alcohol is 10 mg/dL.  For medical purposes only. Performed at [facility], [place] [address], [place], [place] [address]   CBC with Diff     Status: Abnormal   Collection Time: 05/01/22  9:56 PM  Result Value Ref Range   WBC 11.5 (H) 4.0 - 10.5 K/uL   RBC 4.18 (L) 4.22 - 5.81 MIL/uL   Hemoglobin 12.7 (L) 13.0 - 17.0 g/dL   HCT 39.2 39.0 - 52.0 %   MCV 93.8 80.0 - 100.0 fL   MCH 30.4 26.0 - 34.0 pg   MCHC 32.4 30.0 - 36.0 g/dL   RDW 15.9 (H) 11.5 - 15.5 %   Platelets 365 150 - 400 K/uL   nRBC 0.0 0.0 - 0.2 %   Neutrophils Relative % 47 %   Neutro Abs 5.5 1.7 - 7.7 K/uL   Lymphocytes Relative 37 %   Lymphs Abs 4.3 (H) 0.7 - 4.0 K/uL   Monocytes Relative 9 %   Monocytes Absolute 1.1 (H) 0.1 - 1.0 K/uL   Eosinophils Relative 6 %   Eosinophils Absolute 0.7 (H) 0.0 - 0.5 K/uL   Basophils Relative 1 %   Basophils Absolute 0.1 0.0 - 0.1 K/uL   Immature Granulocytes 0 %   Abs Immature Granulocytes 0.02 0.00 - 0.07 K/uL    Comment: Performed at [facility], [place] [address], [place], [place] [address]   Acetaminophen level     Status: Abnormal   Collection Time: 05/01/22  9:56 PM  Result Value Ref Range   Acetaminophen (Tylenol), Serum <10 (L) 10 - 30 ug/mL    Comment: (NOTE) Therapeutic concentrations vary significantly. A range of 10-30 ug/mL  may be an effective concentration for many patients. However, some  are best treated at concentrations outside of this range. Acetaminophen concentrations >150 ug/mL at 4 hours after ingestion  and >50 ug/mL at 12 hours after ingestion are often associated with  toxic reactions.  Performed at [facility], [place] [address] [place], [place] [id number]   Salicylate level     Status: Abnormal   Collection Time: 05/01/22  9:56 PM  Result Value Ref Range   Salicylate Lvl [id number] (L) 7.0 - 30.0 mg/dL    Comment: Performed at [facility] [address] [place], [place] [address]  Urine rapid drug screen (hosp performed)     Status: Abnormal   Collection Time: 05/01/22 10:02 PM  Result Value Ref Range   Opiates NONE DETECTED NONE DETECTED   Cocaine NONE DETECTED NONE DETECTED   Benzodiazepines NONE DETECTED NONE DETECTED   Amphetamines POSITIVE (A) NONE DETECTED   Tetrahydrocannabinol POSITIVE (A) NONE DETECTED   Barbiturates NONE DETECTED NONE DETECTED    Comment: (NOTE) DRUG SCREEN FOR MEDICAL PURPOSES ONLY.  IF CONFIRMATION IS NEEDED FOR ANY PURPOSE, NOTIFY LAB WITHIN 5 DAYS.  LOWEST DETECTABLE LIMITS FOR URINE DRUG SCREEN Drug Class  Cutoff (ng/mL) Amphetamine and metabolites    1000 Barbiturate and metabolites    200 Benzodiazepine                 200 Opiates and metabolites        300 Cocaine and metabolites        300 THC                            50 Performed at [facility], [place] [address], [place], [place] [address]   Resp panel by RT-PCR (RSV, Flu A&B, Covid) Anterior Nasal Swab     Status: None   Collection Time: 05/02/22  8:26 AM   Specimen: Anterior Nasal Swab  Result Value Ref  Range   SARS Coronavirus 2 by RT PCR NEGATIVE NEGATIVE    Comment: (NOTE) SARS-CoV-2 target nucleic acids are NOT DETECTED.  The SARS-CoV-2 RNA is generally detectable in upper respiratory specimens during the acute phase of infection. The lowest concentration of SARS-CoV-2 viral copies this assay can detect is 138 copies/mL. A negative result does not preclude SARS-Cov-2 infection and should not be used as the sole basis for treatment or other patient management decisions. A negative result may occur with  improper specimen collection/handling, submission of specimen other than nasopharyngeal swab, presence of viral mutation(s) within the areas targeted by this assay, and inadequate number of viral copies(<138 copies/mL). A negative result must be combined with clinical observations, patient history, and epidemiological information. The expected result is Negative.  Fact Sheet for Patients:  EntrepreneurPulse.com.au  Fact Sheet for Healthcare Providers:  IncredibleEmployment.be  This test is no t yet approved or cleared by the [place] FDA and  has been authorized for detection and/or diagnosis of SARS-CoV-2 by FDA under an Emergency Use Authorization (EUA). This EUA will remain  in effect (meaning this test can be used) for the duration of the COVID-19 declaration under Section 564(b)(1) of the Act, 21 U.S.C.section 360bbb-3(b)(1), unless the authorization is terminated  or revoked sooner.       Influenza A by PCR NEGATIVE NEGATIVE   Influenza B by PCR NEGATIVE NEGATIVE    Comment: (NOTE) The Xpert Xpress SARS-CoV-2/FLU/RSV plus assay is intended as an aid in the diagnosis of influenza from Nasopharyngeal swab specimens and should not be used as a sole basis for treatment. Nasal washings and aspirates are unacceptable for Xpert Xpress SARS-CoV-2/FLU/RSV testing.  Fact Sheet for  Patients: EntrepreneurPulse.com.au  Fact Sheet for Healthcare Providers: IncredibleEmployment.be  This test is not yet approved or cleared by the [place] FDA and has been authorized for detection and/or diagnosis of SARS-CoV-2 by FDA under an Emergency Use Authorization (EUA). This EUA will remain in effect (meaning this test can be used) for the duration of the COVID-19 declaration under Section 564(b)(1) of the Act, 21 U.S.C. section 360bbb-3(b)(1), unless the authorization is terminated or revoked.     Resp Syncytial Virus by PCR NEGATIVE NEGATIVE    Comment: (NOTE) Fact Sheet for Patients: EntrepreneurPulse.com.au  Fact Sheet for Healthcare Providers: IncredibleEmployment.be  This test is not yet approved or cleared by the [place] FDA and has been authorized for detection and/or diagnosis of SARS-CoV-2 by FDA under an Emergency Use Authorization (EUA). This EUA will remain in effect (meaning this test can be used) for the duration of the COVID-19 declaration under Section 564(b)(1) of the Act, 21 U.S.C. section 360bbb-3(b)(1), unless the authorization is terminated or revoked.  Performed at [facility], [place] [address] [place], [place] [address]     Current Facility-Administered Medications  Medication Dose Route Frequency Provider Last Rate Last Admin   albuterol (VENTOLIN HFA) 108 (90 Base) MCG/ACT inhaler 1-2 puff  1-2 puff Inhalation Q4H PRN Suella Broad A, PA-C       alum & mag hydroxide-simeth (MAALOX/MYLANTA) 200-200-20 MG/5ML suspension 30 mL  30 mL Oral Q6H PRN Suella Broad A, PA-C       ibuprofen (ADVIL) tablet 600 mg  600 mg Oral Q8H PRN Suella Broad A, PA-C       melatonin tablet 5 mg  5 mg Oral QHS Suella Broad A, PA-C       nicotine (NICODERM CQ - dosed in mg/24 hours) patch 21 mg  21 mg Transdermal Daily Suella Broad A, PA-C       ondansetron [facility]) tablet 4 mg   4 mg Oral Q8H PRN Tacy Learn, PA-C       Current Outpatient Medications  Medication Sig Dispense Refill   albuterol (PROVENTIL HFA;VENTOLIN HFA) 108 (90 Base) MCG/ACT inhaler Inhale 2 puffs into the lungs every 6 (six) hours as needed for wheezing or shortness of breath. 1 Inhaler 0   carbamazepine (TEGRETOL) 100 MG chewable tablet Chew 1 tablet (100 mg total) by mouth 2 (two) times daily after a meal. (Patient not taking: Reported on 05/01/2022) 60 tablet 0   hydrOXYzine (ATARAX/VISTARIL) 25 MG tablet Take 1 tablet (25 mg total) by mouth every 6 (six) hours as needed (anxiety/agitation). (Patient not taking: Reported on 05/01/2022) 60 tablet 0   lurasidone (LATUDA) 40 MG TABS tablet Take 1 tablet (40 mg total) by mouth daily with supper. (Patient not taking: Reported on 05/01/2022) 30 tablet 0   nicotine polacrilex (NICORETTE) 2 MG gum Take 1 each (2 mg total) by mouth as needed for smoking cessation. (Patient not taking: Reported on 05/01/2022) 100 tablet 0   nystatin cream (MYCOSTATIN) Apply to affected area 2 times daily (Patient not taking: Reported on 05/01/2022) 15 g 0   predniSONE (DELTASONE) 20 MG tablet Take 2 tablets (40 mg total) by mouth daily. (Patient not taking: Reported on 05/01/2022) 6 tablet 0   traZODone (DESYREL) 50 MG tablet Take 1 tablet (50 mg total) by mouth at bedtime as needed for sleep. (Patient not taking: Reported on 05/01/2022) 30 tablet 0   Psychiatric Specialty Exam: Presentation  General Appearance:  Appropriate for Environment  Eye Contact: Fair  Speech: Clear and Coherent  Speech Volume: Normal  Handedness:No data recorded  Mood and Affect  Mood: Depressed  Affect: Depressed; Flat   Thought Process  Thought Processes: Coherent  Descriptions of Associations:Intact  Orientation:Full (Time, Place and Person)  Thought Content:Logical  History of Schizophrenia/Schizoaffective disorder:No data recorded Duration of Psychotic  Symptoms:No data recorded Hallucinations:Hallucinations: Auditory Description of Auditory Hallucinations: "telling me to kill myself and I have nothing to live for"  Ideas of Reference:None  Suicidal Thoughts:Suicidal Thoughts: Yes, Active SI Active Intent and/or Plan: With Intent; With Plan  Homicidal Thoughts:Homicidal Thoughts: No   Sensorium  Memory: Recent Good; Immediate Good  Judgment: Fair  Insight: Fair   Executive Functions  Concentration: Good  Attention Span: Good  Recall: Good  Fund of Knowledge: Good  Language: Good   Psychomotor Activity  Psychomotor Activity: Psychomotor Activity: Normal   Assets  Assets: Physical Health; Resilience; Desire for Improvement    Sleep  Sleep: Sleep: Fair   Physical Exam: Physical Exam Vitals  reviewed.  Neurological:     Mental Status: He is alert and oriented to person, place, and time.  Psychiatric:        Attention and Perception: Attention normal.        Mood and Affect: Mood is depressed. Affect is flat.        Speech: Speech normal.        Behavior: Behavior is cooperative.        Thought Content: Thought content includes suicidal ideation. Thought content includes suicidal plan.    Review of Systems  Psychiatric/Behavioral:  Positive for depression, hallucinations, substance abuse and suicidal ideas.   All other systems reviewed and are negative.  Blood pressure (!) 147/99, pulse (!) 122, temperature 98.5 F (36.9 C), temperature source Oral, resp. rate 16, SpO2 100 %. There is no height or weight on file to calculate BMI.  Medical Decision Making: Pt case reviewed and discussed with Dr. Leverne Humbles. Pt unable to contract for safety at this time, will recommend inpatient psychiatric treatment. Patient currently under review at [facility], if no availability CSW notified to fax out.   - Patient has previously been prescribed Latuda 40 mg daily in the evening, and Tegretol 100 mg BID - For now will  only restart Latuda 40 mg   Disposition:  recommend IP treatment  [name], NP 05/02/2022 10:57 AM

## 2022-05-02 NOTE — Progress Notes (Signed)
Pt was accepted to [facility] 05/02/2022, pending signed voluntary consent form faxed to [phone]  Pt meets inpatient criteria per [name], NP  Attending Physician will be [name], MD  Report can be called to: Adult unit: [phone]  Pt can arrive after pending items are received  Care Team Notified: [facility] [place], RN, Leonia Reader, RN, [name], NP, Kevan Rosebush, RN, and Juel Burrow, RN  [place], [place]  05/02/2022 1:47 PM

## 2022-05-02 NOTE — Tx Team (Addendum)
Initial Treatment Plan 05/02/2022 3:55 PM Raymond Church [id number]    PATIENT STRESSORS: Medication change or noncompliance     PATIENT STRENGTHS: Ability for insight  Active sense of humor  Average or above average intelligence  Capable of independent living  Communication skills    PATIENT IDENTIFIED PROBLEMS: Grief, insomnia and medication  DISCHARGE CRITERIA:  Ability to meet basic life and health needs Adequate post-discharge living arrangements Reduction of life-threatening or endangering symptoms to within safe limits  PRELIMINARY DISCHARGE PLAN: Attend aftercare/continuing care group Outpatient therapy Return to previous living arrangement  PATIENT/FAMILY INVOLVEMENT: This treatment plan has been presented to and reviewed with the patient, Raymond Church, and/or family member, .  The patient and family have been given the opportunity to ask questions and make suggestions.  Raymond Reader, RN 05/02/2022, 3:55 PM

## 2022-05-02 NOTE — ED Provider Notes (Signed)
[facility] EMERGENCY DEPARTMENT Provider Note   CSN: [id number] Arrival date & time: 05/01/22  2114     History  Chief Complaint  Patient presents with   Suicidal Ideation     Raymond Church is a 32 y.o. male.  32 year old male with past medical history of asthma, depression, bipolar disorder presents with complaint of suicidal ideation with plan to step out into traffic.  Patient states over the past month he has had multiple family members die and he is not coping well.  Homicidal ideation, will not be specific.  Previously on psychiatric medications, discontinued several years ago.  Denies drug or alcohol use, reports he used to use drugs years ago but quit.  Recently quit smoking.  Denies regular alcohol use.  Reported to triage auditory and visual hallucinations, no complaint at this time.       Home Medications Prior to Admission medications   Medication Sig Start Date End Date Taking? Authorizing Provider  albuterol (PROVENTIL HFA;VENTOLIN HFA) 108 (90 Base) MCG/ACT inhaler Inhale 2 puffs into the lungs every 6 (six) hours as needed for wheezing or shortness of breath. 05/07/16  Yes Withrow, Everardo All, [name]  carbamazepine (TEGRETOL) 100 MG chewable tablet Chew 1 tablet (100 mg total) by mouth 2 (two) times daily after a meal. Patient not taking: Reported on 05/01/2022 05/07/16   Withrow, Everardo All, [name]  hydrOXYzine (ATARAX/VISTARIL) 25 MG tablet Take 1 tablet (25 mg total) by mouth every 6 (six) hours as needed (anxiety/agitation). Patient not taking: Reported on 05/01/2022 05/07/16   Beau Fanny, [name]  lurasidone (LATUDA) 40 MG TABS tablet Take 1 tablet (40 mg total) by mouth daily with supper. Patient not taking: Reported on 05/01/2022 05/07/16   Beau Fanny, [name]  nicotine polacrilex (NICORETTE) 2 MG gum Take 1 each (2 mg total) by mouth as needed for smoking cessation. Patient not taking: Reported on 05/01/2022 05/07/16   Beau Fanny, [name]  nystatin cream  (MYCOSTATIN) Apply to affected area 2 times daily Patient not taking: Reported on 05/01/2022 01/13/17   Maxwell Caul, PA-C  predniSONE (DELTASONE) 20 MG tablet Take 2 tablets (40 mg total) by mouth daily. Patient not taking: Reported on 05/01/2022 10/13/16   Ward, Chase Picket, PA-C  traZODone (DESYREL) 50 MG tablet Take 1 tablet (50 mg total) by mouth at bedtime as needed for sleep. Patient not taking: Reported on 05/01/2022 05/07/16   Beau Fanny, [name]      Allergies    Shellfish allergy, Amoxicillin, Penicillins, and Mushroom extract complex    Review of Systems   Review of Systems Negative except as per HPI Physical Exam Updated Vital Signs BP 136/72 (BP Location: Right Arm)   Pulse 98   Temp 98 F (36.7 C) (Oral)   Resp 16   SpO2 98%  Physical Exam Vitals and nursing note reviewed.  Constitutional:      General: He is not in acute distress.    Appearance: He is well-developed. He is not diaphoretic.  HENT:     Head: Normocephalic and atraumatic.  Cardiovascular:     Rate and Rhythm: Normal rate and regular rhythm.  Pulmonary:     Effort: Pulmonary effort is normal.     Breath sounds: Wheezing present.  Abdominal:     Palpations: Abdomen is soft.     Tenderness: There is no abdominal tenderness.  Skin:    General: Skin is warm and dry.     Findings: No erythema  or rash.  Neurological:     Mental Status: He is alert and oriented to person, place, and time.  Psychiatric:        Behavior: Behavior normal.     ED Results / Procedures / Treatments   Labs (all labs ordered are listed, but only abnormal results are displayed) Labs Reviewed  COMPREHENSIVE METABOLIC PANEL - Abnormal; Notable for the following components:      Result Value   Potassium 3.3 (*)    Alkaline Phosphatase 35 (*)    All other components within normal limits  RAPID URINE DRUG SCREEN, HOSP PERFORMED - Abnormal; Notable for the following components:   Amphetamines POSITIVE (*)     Tetrahydrocannabinol POSITIVE (*)    All other components within normal limits  CBC WITH DIFFERENTIAL/PLATELET - Abnormal; Notable for the following components:   WBC 11.5 (*)    RBC 4.18 (*)    Hemoglobin 12.7 (*)    RDW 15.9 (*)    Lymphs Abs 4.3 (*)    Monocytes Absolute 1.1 (*)    Eosinophils Absolute 0.7 (*)    All other components within normal limits  ACETAMINOPHEN LEVEL - Abnormal; Notable for the following components:   Acetaminophen (Tylenol), Serum <10 (*)    All other components within normal limits  SALICYLATE LEVEL - Abnormal; Notable for the following components:   Salicylate Lvl <2.7 (*)    All other components within normal limits  RESP PANEL BY RT-PCR (RSV, FLU A&B, COVID)  RVPGX2  ETHANOL    EKG EKG Interpretation  Date/Time:  Monday May 02 2022 08:06:39 EST Ventricular Rate:  78 PR Interval:  138 QRS Duration: 74 QT Interval:  378 QTC Calculation: 430 R Axis:   85 Text Interpretation: Sinus rhythm with marked sinus arrhythmia ST elevation, consider early repolarization Borderline ECG No significant change since last tracing Confirmed by Leanord Asal ([id number]) on 05/02/2022 8:27:22 AM  Radiology No results found.  Procedures Procedures    Medications Ordered in ED Medications  albuterol (VENTOLIN HFA) 108 (90 Base) MCG/ACT inhaler 1-2 puff (has no administration in time range)  ibuprofen (ADVIL) tablet 600 mg (has no administration in time range)  ondansetron (ZOFRAN) tablet 4 mg (has no administration in time range)  alum & mag hydroxide-simeth (MAALOX/MYLANTA) 200-200-20 MG/5ML suspension 30 mL (has no administration in time range)  nicotine (NICODERM CQ - dosed in mg/24 hours) patch 21 mg (21 mg Transdermal Patch Applied 05/02/22 1133)  melatonin tablet 5 mg (has no administration in time range)  lurasidone (LATUDA) tablet 40 mg (has no administration in time range)  potassium chloride SA (KLOR-CON M) CR tablet 40 mEq (40 mEq Oral Given 05/02/22  0831)    ED Course/ Medical Decision Making/ A&P Clinical Course as of 05/02/22 [place] May 02, 2022  0843 Medically cleared for behavioral health evaluation and disposition. [LM]    Clinical Course User Index [LM] Tacy Learn, PA-C                           Medical Decision Making Risk OTC drugs. Prescription drug management.   This patient presents to the ED for concern of suicidal ideation with plan, homicidal ideation, this involves an extensive number of treatment options, and is a complaint that carries with it a high risk of complications and morbidity.  The differential diagnosis includes but not limited to depression, psychosis,   Co morbidities that complicate the patient evaluation  Depression, bipolar disorder, asthma   Additional history obtained:  External records from outside source obtained and reviewed including recently seen at [facility] ER on 03/31/2022 for influenza.   Lab Tests:  I Ordered, and personally interpreted labs.  The pertinent results include: UDS positive for amphetamines and marijuana.  Salicylate and acetaminophen levels negative.  Alcohol negative.  CBC with mild leukocytosis at 11.5, recently completed course of steroids.  CMP with mild hypokalemia with potassium of 3.3.  Cardiac Monitoring: / EKG:  The patient was maintained on a cardiac monitor.  I personally viewed and interpreted the cardiac monitored which showed an underlying rhythm of: Sinus rhythm, rate 78   Consultations Obtained:  I requested consultation with the behavioral health team,  and discussed lab and imaging findings as well as pertinent plan - they recommend: inpatient treatment.    Problem List / ED Course / Critical interventions / Medication management  32 year old male with history of depression and bipolar disorder presents with suicidal and homicidal ideation after death of multiple family members over the past month with plan to step into traffic.  Denies  complaints otherwise.  Found to have mild wheezing on exam, has history of asthma, reports he uses his inhaler about 5 times daily.  Inhaler ordered for use during stay.  Medically cleared for behavioral health evaluation and disposition. I ordered medication including K-Dur for hypokalemia, albuterol for asthma/wheezing Reevaluation of the patient after these medicines showed that the patient improved I have reviewed the patients home medicines and have made adjustments as needed   Social Determinants of Health:  Lives with family   Test / Admission - Considered:  Disposition pending behavioral for evaluation         Final Clinical Impression(s) / ED Diagnoses Final diagnoses:  Psychosis, unspecified psychosis type [facility])    Rx / DC Orders ED Discharge Orders     None         Tacy Learn, PA-C 05/02/22 1301    Leanord Asal K, DO 05/02/22 1556

## 2022-05-02 NOTE — ED Notes (Signed)
RN left voicmail for safety transport

## 2022-05-02 NOTE — Progress Notes (Signed)
Pt admitted to [facility] from [facility] voluntarily, for suicidal ideation.  Pt upon admission presents with mixed hypomanic mood, and pressured speech. His speech is circumstantial at times but he does answer questions appropriately. Pt states '' yeah I'm here because of those suicidal thoughts. I've been going through it, I lost my father, my granparents who were both in hospice, and then my kid who was 86 months old. I just can't take it. I was sitting on the side of the road and I was hearing voices that told me yeah just do it, you punk ass you're not gonna do it, just end it ''  Patient reports he called family for help and wants to '' get back on my medication so I can get my mind right ''  Patient does report some AH at times but states '' talking to people helped and they had a sitter for me to talk to so I haven't noticed them as much ''  Pt skin searched and belongings searched, no wounds, old tattoos and no contraband found.  Pt given sandwich on admission and toiletries and oriented to the unti. Pt states he '' has been here a few times and I'm here to get the help and get back on my medications. '' Pt denies drug use however urine drug screen noted abnormal.  Pt is safe, will con't to monitor.

## 2022-05-02 NOTE — Progress Notes (Signed)
Per [facility], RN, pt is currently under review for [facility]. CSW will continue to assist and follow with placement.  Denna Haggard, [place]  05/02/2022 12:21 PM

## 2022-05-03 DIAGNOSIS — R45851 Suicidal ideations: Secondary | ICD-10-CM | POA: Diagnosis not present

## 2022-05-03 MED ORDER — PRAZOSIN HCL 1 MG PO CAPS
1.0000 mg | ORAL_CAPSULE | Freq: Every day | ORAL | Status: DC
  Administered 2022-05-03 – 2022-05-05 (×3): 1 mg via ORAL
  Filled 2022-05-03 (×6): qty 1

## 2022-05-03 MED ORDER — LORAZEPAM 1 MG PO TABS: 2.0000 mg | ORAL_TABLET | Freq: Three times a day (TID) | ORAL | Status: DC | PRN

## 2022-05-03 MED ORDER — POTASSIUM CHLORIDE CRYS ER 20 MEQ PO TBCR
40.0000 meq | EXTENDED_RELEASE_TABLET | Freq: Once | ORAL | Status: AC
  Administered 2022-05-03: 40 meq via ORAL
  Filled 2022-05-03 (×2): qty 2

## 2022-05-03 MED ORDER — HYDROXYZINE HCL 25 MG PO TABS
25.0000 mg | ORAL_TABLET | Freq: Three times a day (TID) | ORAL | Status: DC | PRN
  Administered 2022-05-06 – 2022-05-09 (×4): 25 mg via ORAL
  Filled 2022-05-03 (×4): qty 1

## 2022-05-03 MED ORDER — LURASIDONE HCL 40 MG PO TABS
40.0000 mg | ORAL_TABLET | Freq: Every day | ORAL | Status: DC
  Administered 2022-05-03: 40 mg via ORAL
  Filled 2022-05-03 (×3): qty 1

## 2022-05-03 MED ORDER — LORAZEPAM 2 MG/ML IJ SOLN: 2.0000 mg | Freq: Three times a day (TID) | INTRAMUSCULAR | Status: DC | PRN

## 2022-05-03 MED ORDER — HALOPERIDOL LACTATE 5 MG/ML IJ SOLN: 5.0000 mg | Freq: Three times a day (TID) | INTRAMUSCULAR | Status: DC | PRN

## 2022-05-03 MED ORDER — HALOPERIDOL 5 MG PO TABS: 5.0000 mg | ORAL_TABLET | Freq: Three times a day (TID) | ORAL | Status: DC | PRN

## 2022-05-03 MED ORDER — DIPHENHYDRAMINE HCL 25 MG PO CAPS: 50.0000 mg | ORAL_CAPSULE | Freq: Three times a day (TID) | ORAL | Status: DC | PRN

## 2022-05-03 MED ORDER — TRAZODONE HCL 50 MG PO TABS
50.0000 mg | ORAL_TABLET | Freq: Every day | ORAL | Status: DC
  Administered 2022-05-03 – 2022-05-05 (×3): 50 mg via ORAL
  Filled 2022-05-03 (×5): qty 1

## 2022-05-03 MED ORDER — DIPHENHYDRAMINE HCL 50 MG/ML IJ SOLN: 50.0000 mg | Freq: Three times a day (TID) | INTRAMUSCULAR | Status: DC | PRN

## 2022-05-03 NOTE — Group Note (Signed)
LCSW Group Therapy Note   Group Date: 05/03/2022 Start Time: 1100 End Time: 1200  Type of Therapy and Topic: Group Therapy: Effective Communication   Participation Level: Did not attend    Description of Group:  In this group patients will be asked to identify their own styles of communication as well as defining and identifying passive, assertive, and aggressive styles of communication. Participants will identify strategies to communicate in a more assertive manner in an effort to appropriately meet their needs. This group will be process-oriented, with patients participating in exploration of their own experiences as well as giving and receiving support and challenge from other group members.   Therapeutic Goals: 1. Patient will identify their personal communication style. 2. Patient will identify passive, assertive, and aggressive forms of communication. 3. Patient will identify strategies for developing more effective communication to appropriately meet their needs.    Summary of Patient Progress: Did not attend     Therapeutic Modalities:  Communication Skills Solution Focused Therapy Motivational Interviewing  Darleen Crocker, [place] 05/03/2022  2:00 PM

## 2022-05-03 NOTE — BHH Counselor (Signed)
Adult Comprehensive Assessment  Patient ID: Raymond Church, male   DOB: 1990/06/29, 32 y.o.   MRN: [id number]  Information Source: Information source: Patient  Current Stressors:  Patient states their primary concerns and needs for treatment are:: "Depression, anxiety, and suicidal thoughts" Patient states their goals for this hospitilization and ongoing recovery are:: "To get back in the right state of mind" Educational / Learning stressors: Designer, industrial/product in [employer] Employment / Job issues: Pt reports being unemployed Family Relationships: Pt reports having few family Materials engineer / Lack of resources (include bankruptcy): Pt reports his Godmother is a Transport planner / Lack of housing: Pt reports living with his Godmother in [place], [place] Physical health (include injuries & life threatening diseases): Pt reports no stressors Social relationships: Pt reports having few social supports Substance abuse: Pt denies any substance use at this time Bereavement / Loss: Pt reports his father, grandparents, and aunt passed away in Aug 29, 2020 and mother passed away when he was 34yo.  Living/Environment/Situation:  Living Arrangements: Other relatives Living conditions (as described by patient or guardian): House/[place] Who else lives in the home?: Godmother How long has patient lived in current situation?: 2 months What is atmosphere in current home: Comfortable, Supportive  Family History:  Marital status: Single (Pt reports a break-up 2 months ago which he states led to him experiencing homelessness.) Are you sexually active?: Yes What is your sexual orientation?: Heterosexual Has your sexual activity been affected by drugs, alcohol, medication, or emotional stress?: No Does patient have children?: Yes How many children?: 6 How is patient's relationship with their children?: "I was seeing my children everyday but I can't see them now because their mother wont let me" (ages 102,  37, 25, 83, and 3)  Childhood History:  By whom was/is the patient raised?: Mother Additional childhood history information: Pt reports "my mother passed away in my arms when I was 32yo".  He states that he lived with his father for 2 years after that. Description of patient's relationship with caregiver when they were a child: "It was good. We had a great relationship" Patient's description of current relationship with people who raised him/her: "She passed away when I was 32yo" How were you disciplined when you got in trouble as a child/adolescent?: Spankings and Groundings Does patient have siblings?: Yes Number of Siblings: 1 Description of patient's current relationship with siblings: "I have a sister and we get along alright.  She lives in [place]" Did patient suffer any verbal/emotional/physical/sexual abuse as a child?: No Did patient suffer from severe childhood neglect?: No Has patient ever been sexually abused/assaulted/raped as an adolescent or adult?: No Was the patient ever a victim of a crime or a disaster?: No Witnessed domestic violence?: No Has patient been affected by domestic violence as an adult?: No  Education:  Highest grade of school patient has completed: 12th grade, Associate in Primary school teacher Currently a student?: No Learning disability?: No  Employment/Work Situation:   Employment Situation: Unemployed Patient's Job has Been Impacted by Current Illness: No What is the Longest Time Patient has Held a Job?: 20 years 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" Has Patient ever Been in the [employer]?: No  Financial Resources:   Museum/gallery curator resources: Support from parents / caregiver Does patient have a Programmer, applications or guardian?: No  Alcohol/Substance Abuse:   What has been your use of drugs/alcohol within the last 12 months?: Pt denies any substance use at this time  If attempted suicide, did drugs/alcohol play a  role in this?: No Alcohol/Substance Abuse Treatment Hx: Past Tx, Inpatient If yes, describe treatment: [facility] in 08-18-14 Has alcohol/substance abuse ever caused legal problems?: No  Social Support System:   Pensions consultant Support System: Poor Describe Community Support System: "Godmother " Type of faith/religion: "Spiritual" How does patient's faith help to cope with current illness?: "Prayer"  Leisure/Recreation:   Do You Have Hobbies?: Yes Leisure and Hobbies: "Music"  Strengths/Needs:   What is the patient's perception of their strengths?: "Being talented" Patient states they can use these personal strengths during their treatment to contribute to their recovery: "It puts me in a different state of mind" Patient states these barriers may affect/interfere with their treatment: None Patient states these barriers may affect their return to the community: None Other important information patient would like considered in planning for their treatment: None  Discharge Plan:   Currently receiving community mental health services: No Patient states concerns and preferences for aftercare planning are: Pt is interested in therapy and medication management in the [facility] [place] Patient states they will know when they are safe and ready for discharge when: "When the suicidal thoughts are out of my mind" Does patient have access to transportation?: Yes (Godmother and Bus) Does patient have financial barriers related to discharge medications?: Yes Patient description of barriers related to discharge medications: Limited income Will patient be returning to same living situation after discharge?: Yes  Summary/Recommendations:   Summary and Recommendations (to be completed by the evaluator): Raymond Church is a 32 year old, male, who was admitted to the hospital due to worsening depression, anxiety, and sucidal thoughts. He reports that he had a recent break-up with his girlfriend of 18  years "which led to me being homeless now".  He states that he currently lives with his Godmother in [place], [place].  He states that his only family contact is his sister who lives in [place], [place].  He states that his father, grandparents, and aunt all passed away in 2020-08-17.  He states that his mother passed away when he was 54yo and "she died in my arms".  The Pt reports having 6 children with his ex-girlfriend (ages 85, 72, 71, 12, and 3) and states that he has not seen or spoken with the children recently due to conflict with their mother.  The Pt denies any childhood abuse or neglect.  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  He states that he is unemployed and working on his Research officer, trade union.  He also reports that his Godmother is a financial support for him and that he receives [employer].  The Pt denies any current substance use but does state that he was previously admitted to [facility] in 08/18/2014 for substance use treatment. While in the hospital the Pt can benefit from crisis stabilization, medication evaluation, group therapy, psycho-education, case management, and discharge planning. Upon discharge the Pt would like to return home with his Godmother. It is recommended that the Pt follow-up with a local outpatient provider for therapy and medication management. It is also recommended that the Pt continue to take all medications as prescribed until directed to do otherwise by his providers.  Darleen Crocker. 05/03/2022

## 2022-05-03 NOTE — Plan of Care (Signed)
  Problem: Coping: Goal: Ability to verbalize frustrations and anger appropriately will improve Outcome: Progressing Goal: Ability to demonstrate self-control will improve Outcome: Progressing   Problem: Education: Goal: Knowledge of [facility] Education information/materials will improve Outcome: Progressing Goal: Emotional status will improve Outcome: Progressing Goal: Mental status will improve Outcome: Progressing Goal: Verbalization of understanding the information provided will improve Outcome: Progressing   

## 2022-05-03 NOTE — H&P (Addendum)
Psychiatric Admission Assessment Adult  Patient Identification: Raymond Church MRN:  [id number] Date of Evaluation:  05/03/2022 Chief Complaint:  Suicidal ideation [R45.851] Principal Diagnosis: Suicidal ideation Diagnosis:  Principal Problem:   Suicidal ideation Bipolar disorder,  Anxiety, depression Polysubstance usage  CC: Suicidal ideation  History of Present Illness: Raymond Church is a 32 y.o. male with past medical history of asthma, depression, polysubstance usage, bipolar disorder who presents to [facility] from [facility] ED with complaint of suicidal ideation with plan to step out into traffic.  Patient states over the past month he has had multiple death in his family, and he is unable to cope effectively.  Previously on psychiatric medications, which he discontinued several years ago for no specific reason. Denies drug or alcohol use, reports he used to use drugs years ago but quit.  Currently reports marijuana usage of 1 g twice daily.  Recently quit smoking however, relapsed on May 01, 2022 due to being overwhelmed with his stressors.  Denies regular alcohol use.  Patient currently lives with his Godmother.  He reports the following during admission:  "On April 29, 2022, I attempted to kill myself due to not being able to cope with my stressors.  My stressors to include father died September 2023 September 2023, grandfather died Jan 21, 2022, grandmother died Jan 21, 2022 and aunt died 01/21/22.  My mother died in my hand when I was 83 years old, that gave me PTSD. I was sitting on my driveway which is a busy traffic area and contemplating running into an oncoming car driving at 50 mph.  Something tells me to get up and get back into the house with my godmother.  I told my godmother about my suicidal thoughts and attempt and she decided to take me to the hospital.  My godmother added, that she observed me having some PTSD episodes with  symptoms to include: Screaming at his kids while asleep.  Getting up at night and walking up and down while he was asleep.  Not able to concentrate while communicating with her."  Assessment: On assessment today patient was seen and examined in [place].  He appears anxious and restless during the examination.  Patient personal hygiene needs attention.  Chart reviewed as indicated above and findings shared with the treatment team and discussed with the attending psychiatrist.  Alert and oriented x 4, speech clear, coherent however pressured.  Mood is anxious and depressed and affect is flat.  Patient reports he has not taking his psychotropic medications for a long time and does not remember when last he took some.  Reports anxiety, with occasional panic attack when he is distressed.  Rates his anxiety as 7/10 with 10 being the worst.  Reports depressed mood and rated depression as 9/10 with 10 being the worst.  Reports appetite is good now.  However added, "I lost some pounds before I got to the hospital because I was so depressed and was not eating."  Patient does not seem to be responding to internal or external stimuli during assessment.  No delusional thinking, mania or paranoia noted during encounter.    Labs reviewed include CMP: Potassium 3.3 replaced with potassium chloride.  BMP to be ordered.  Phosphatase 35 low otherwise normal.  CBC: WBC 11.5 high, RBC 4.18 low, hemoglobin 12.7 low, RDW 15.5 high, Otherwise normal.  Urine toxicology: Positive for amphetamines and tetrahydrocannabinol.  Instructions provided to patient on cessation of polysubstance usage due to adverse effects  on overall medical and psychiatric wellbeing.  Patient nods in agreement. Due to the clinical signs and symptoms indicated below, patient is admitted to [facility] for mood stabilization, medication management, and safety.   Patient continues to endorse SI without any plans or intent.  He denies HI AVH.  Patient's plan is to get  back on his Latuda here at the hospital and to schedule outpatient follow-up and therapy upon discharge.  Mode of transport to Hospital:Safe Transport  Current Outpatient (Home) Medication List: Albuterol 108 90 base micrograms/ACT inhaler 2 puffs into the lungs every 6 hours as needed for wheezing or shortness of breath Carbamazepine Tegretol 100 mg chewable tablets 1 tablets by mouth 2 times daily after meal Hydroxyzine 25 mg tablets 1 p.o. every 6 hours as needed for anxiety/agitation lurasidone Latuda 40 mg tablets 1 tab by mouth with supper nicotine 2 mg gum 1 by mouth as needed for smoking cessation nystatin cream apply to affected areas 2 times daily prednisone 20 mg tablet 2 tablets by mouth daily Trazodone 50 mg tab by mouth at bedtime as needed for sleep.  All the  ED course:  The pertinent lab results include: UDS positive for amphetamines and marijuana.  Salicylate and acetaminophen levels negative.  Alcohol negative.  CBC with mild leukocytosis at 11.5, recently completed course of steroids.  CMP with mild hypokalemia with potassium of 3.3.  Cardiac Monitoring: / EKG:  The patient was maintained on a cardiac monitor.  cardiac monitored which showed an underlying rhythm of: Sinus rhythm, rate 78  Collateral Information: Patient states, I can provide my own collateral. However, you can call my Godmother.  POA/Legal Guardian: Patient is an adult, states no legal guardian.  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   Current/prior outpatient treatment: Patient was treated at [facility] for polysubstance treatment Prior rehab hx: Applicable Psychotherapy hx: Treated at  behavioral health Hospital History of suicide: Patient denies History of homicide or aggression: Patient denies Psychiatric medication history: Patient noncompliance with medications reports running out Psychiatric medication compliance history: Noncompliance Neuromodulation history: Not applicable Current Psychiatrist: None reported per Patient Current therapist: None reported by patient  Substance Abuse Hx: Alcohol: Patient denies alcohol usage Tobacco: Patient reports that he quit smoking 3 months ago and then relapsed on December 31, [id number] Illicit drugs: Smokes marijuana 1 g 2 times daily Rx drug abuse: Patient denies any other drug use Rehab hx: Denies history of rehabilitation  Past Medical History: Medical Diagnoses: Chronic asthma Home Rx: Albuterol Prior Hosp: Hospitalized at [facility] for asthma treatment Prior Surgeries/Trauma: Not applicable Head trauma, LOC, concussions, seizures: Not applicable Allergies: Allergic to penicillin, amoxicillin, shellfish and mushrooms LMP: Not applicable Contraception: Not applicable PCP: Patient currently has no PCP  Family History: Medical: Mother had diabetes, and peripheral neuropathy, lupus and high blood pressure Psych: Mom had bipolar disorder and depression Psych Rx: Patient does not remember treatment SA/HA: Mom had suicide attempts and homicidal attempt Substance use family hx: Patient and mom was using crack cocaine.  Mom died in 09-08-03  Social History: Childhood (bring, raised, lives now, parents, siblings, schooling, education): Abuse: Not applicable Marital Status: Not applicable patient is single Sexual orientation: Male Children: Patient has 6 children, 5 living 1  deceased.  19, 86, 2-5 years, and 4 years old  Employment: Unemployed Peer Group: Applicable Housing: Patient lives with god mother  Finances: Low social economic status Legal: Denies Science writer: Patient denies serving  in [employer] services  Associated Signs/Symptoms: Depression Symptoms:  depressed mood, anhedonia, insomnia, fatigue, feelings of worthlessness/guilt, difficulty concentrating, hopelessness, suicidal thoughts without plan, anxiety, panic attacks, loss of energy/fatigue, disturbed sleep, weight loss,  (Hypo) Manic Symptoms:  Distractibility, Impulsivity, Irritable Mood,  Anxiety Symptoms:  Excessive Worry, Panic Symptoms, Obsessive Compulsive Symptoms:   Checking, Counting, Handwashing,, Social Anxiety,  Psychotic Symptoms:   [name]  PTSD Symptoms: Had a traumatic exposure:  In 2005 Re-experiencing:  Flashbacks Intrusive Thoughts Nightmares Hypervigilance:  No  Total Time spent with patient: 1 hour  Past Psychiatric History: Patient admitted to [facility] for suicidal ideation and depression in August 2017.  Also admitted to [facility] with suicidal ideation with psychotic features in May 07, 2016.  He has been treated at [facility] recovery for drug use in 2018.  History of bipolar disorder, depression, anxiety, polysubstance use and PTSD  Is the patient at risk to self? Yes.    Has the patient been a risk to self in the past 6 months? No.  Has the patient been a risk to self within the distant past? No.  Is the patient a risk to others? No.  Has the patient been a risk to others in the past 6 months? No.  Has the patient been a risk to others within the distant past? No.   [place] Scale:  [place] Admission (Current) from 05/02/2022 in [facility] [facility] ED from 05/01/2022 in [place] High Risk High Risk        Prior Inpatient Therapy: Yes.   If yes, describe: Suicidal ideation. Have been hospitalized x 2 to [facility] in 2017 and 2018, also to [facility] in 2018  Prior Outpatient Therapy: Yes.   If yes, describe Outpatient at [facility] in 2018 for substance treatment  Alcohol  Screening: 1. How often do you have a drink containing alcohol?: Never 2. How many drinks containing alcohol do you have on a typical day when you are drinking?: 1 or 2 3. How often do you have six or more drinks on one occasion?: Never AUDIT-C Score: 0 Alcohol Brief Interventions/Follow-up: Alcohol education/Brief advice Substance Abuse History in the last 12 months:  Yes.   Consequences of Substance Abuse: NA Previous Psychotropic Medications: Yes  Psychological Evaluations: Yes  Past Medical History:  Past Medical History:  Diagnosis Date   Asthma    Bipolar disorder ([place])    Depression    Pneumonia     Past Surgical History:  Procedure Laterality Date   NO PAST SURGERIES     Family History:  Family History  Problem Relation Age of Onset   Bipolar disorder Mother    Drug abuse Mother    Family Psychiatric  History:  Tobacco Screening:  Social History   Tobacco Use  Smoking Status Every Day   Packs/day: 0.25   Years: 10.00   Total pack years: 2.50   Types: Cigarettes  Smokeless Tobacco Never  Tobacco Comments   refused    BH Tobacco Counseling     Are you interested in Tobacco Cessation Medications?  Yes, implement Nicotene Replacement Protocol Counseled patient on smoking cessation:  Yes Reason Tobacco Screening Not Completed: No value filed.  Social History:  Social History   Substance and Sexual Activity  Alcohol Use Never   Comment: denies     Social History   Substance and Sexual Activity  Drug Use Yes   Types: Marijuana, Methamphetamines   Comment: currently using    Additional Social History:  Allergies:   Allergies  Allergen Reactions   Shellfish Allergy Anaphylaxis   Amoxicillin Other (See Comments)    Has patient had a PCN reaction causing immediate rash, facial/tongue/throat swelling, SOB or lightheadedness with hypotension: No Has patient had a PCN reaction causing severe rash involving mucus membranes or skin necrosis:  No Has patient had a PCN reaction that required hospitalization No Has patient had a PCN reaction occurring within the last 10 years: No If all of the above answers are "NO", then may proceed with Cephalosporin use.   Penicillins Other (See Comments)    Has patient had a PCN reaction causing immediate rash, facial/tongue/throat swelling, SOB or lightheadedness with hypotension: No Has patient had a PCN reaction causing severe rash involving mucus membranes or skin necrosis: No Has patient had a PCN reaction that required hospitalization No Has patient had a PCN reaction occurring within the last 10 years: No If all of the above answers are "NO", then may proceed with Cephalosporin use.   Mushroom Extract Complex Nausea And Vomiting   Lab Results:  Results for orders placed or performed during the hospital encounter of 05/01/22 (from the past 48 hour(s))  Comprehensive metabolic panel     Status: Abnormal   Collection Time: 05/01/22  9:56 PM  Result Value Ref Range   Sodium 140 135 - 145 mmol/L   Potassium 3.3 (L) 3.5 - 5.1 mmol/L   Chloride 105 98 - 111 mmol/L   CO2 28 22 - 32 mmol/L   Glucose, Bld 89 70 - 99 mg/dL    Comment: Glucose reference range applies only to samples taken after fasting for at least 8 hours.   BUN 8 6 - 20 mg/dL   Creatinine, Ser 1.00 0.61 - 1.24 mg/dL   Calcium 8.9 8.9 - 10.3 mg/dL   Total Protein 7.1 6.5 - 8.1 g/dL   Albumin 3.5 3.5 - 5.0 g/dL   AST 31 15 - 41 U/L   ALT 22 0 - 44 U/L   Alkaline Phosphatase 35 (L) 38 - 126 U/L   Total Bilirubin 0.8 0.3 - 1.2 mg/dL   GFR, Estimated >60 >60 mL/min    Comment: (NOTE) Calculated using the CKD-EPI Creatinine Equation (2021)    Anion gap 7 5 - 15    Comment: Performed at [facility] [address], [place], [place] [address]  Ethanol     Status: None   Collection Time: 05/01/22  9:56 PM  Result Value Ref Range   Alcohol, Ethyl (B) <10 <10 mg/dL    Comment: (NOTE) Lowest detectable limit for serum  alcohol is 10 mg/dL.  For medical purposes only. Performed at [facility], [place] [address] [place],  [address]   CBC with Diff     Status: Abnormal   Collection Time: 05/01/22  9:56 PM  Result Value Ref Range   WBC 11.5 (H) 4.0 - 10.5 K/uL   RBC 4.18 (L) 4.22 - 5.81 MIL/uL   Hemoglobin 12.7 (L) 13.0 - 17.0 g/dL   HCT 39.2 39.0 - 52.0 %   MCV 93.8 80.0 - 100.0 fL   MCH 30.4 26.0 - 34.0 pg   MCHC 32.4  30.0 - 36.0 g/dL   RDW 15.9 (H) 11.5 - 15.5 %   Platelets 365 150 - 400 K/uL   nRBC 0.0 0.0 - 0.2 %   Neutrophils Relative % 47 %   Neutro Abs 5.5 1.7 - 7.7 K/uL   Lymphocytes Relative 37 %   Lymphs Abs 4.3 (H) 0.7 - 4.0 K/uL   Monocytes Relative 9 %   Monocytes Absolute 1.1 (H) 0.1 - 1.0 K/uL   Eosinophils Relative 6 %   Eosinophils Absolute 0.7 (H) 0.0 - 0.5 K/uL   Basophils Relative 1 %   Basophils Absolute 0.1 0.0 - 0.1 K/uL   Immature Granulocytes 0 %   Abs Immature Granulocytes 0.02 0.00 - 0.07 K/uL    Comment: Performed at [facility] [address] [place], [place] [address]  Acetaminophen level     Status: Abnormal   Collection Time: 05/01/22  9:56 PM  Result Value Ref Range   Acetaminophen (Tylenol), Serum <10 (L) 10 - 30 ug/mL    Comment: (NOTE) Therapeutic concentrations vary significantly. A range of 10-30 ug/mL  may be an effective concentration for many patients. However, some  are best treated at concentrations outside of this range. Acetaminophen concentrations >150 ug/mL at 4 hours after ingestion  and >50 ug/mL at 12 hours after ingestion are often associated with  toxic reactions.  Performed at [facility], [place] [address], [place], [place] [id number]   Salicylate level     Status: Abnormal   Collection Time: 05/01/22  9:56 PM  Result Value Ref Range   Salicylate Lvl [id number] (L) 7.0 - 30.0 mg/dL    Comment: Performed at [facility] [address], [place], [place] [address]  Urine rapid drug screen (hosp performed)      Status: Abnormal   Collection Time: 05/01/22 10:02 PM  Result Value Ref Range   Opiates NONE DETECTED NONE DETECTED   Cocaine NONE DETECTED NONE DETECTED   Benzodiazepines NONE DETECTED NONE DETECTED   Amphetamines POSITIVE (A) NONE DETECTED   Tetrahydrocannabinol POSITIVE (A) NONE DETECTED   Barbiturates NONE DETECTED NONE DETECTED    Comment: (NOTE) DRUG SCREEN FOR MEDICAL PURPOSES ONLY.  IF CONFIRMATION IS NEEDED FOR ANY PURPOSE, NOTIFY LAB WITHIN 5 DAYS.  LOWEST DETECTABLE LIMITS FOR URINE DRUG SCREEN Drug Class                     Cutoff (ng/mL) Amphetamine and metabolites    1000 Barbiturate and metabolites    200 Benzodiazepine                 200 Opiates and metabolites        300 Cocaine and metabolites        300 THC                            50 Performed at [facility], [place] [address] [place], [place] [address]   Resp panel by RT-PCR (RSV, Flu A&B, Covid) Anterior Nasal Swab     Status: None   Collection Time: 05/02/22  8:26 AM   Specimen: Anterior Nasal Swab  Result Value Ref Range   SARS Coronavirus 2 by RT PCR NEGATIVE NEGATIVE    Comment: (NOTE) SARS-CoV-2 target nucleic acids are NOT DETECTED.  The SARS-CoV-2 RNA is generally detectable in upper respiratory specimens during the acute phase of infection. The lowest concentration of SARS-CoV-2  viral copies this assay can detect is 138 copies/mL. A negative result does not preclude SARS-Cov-2 infection and should not be used as the sole basis for treatment or other patient management decisions. A negative result may occur with  improper specimen collection/handling, submission of specimen other than nasopharyngeal swab, presence of viral mutation(s) within the areas targeted by this assay, and inadequate number of viral copies(<138 copies/mL). A negative result must be combined with clinical observations, patient history, and epidemiological information. The expected result is Negative.  Fact  Sheet for Patients:  BloggerCourse.comhttps://www.fda.gov/media/152166/download  Fact Sheet for Healthcare Providers:  SeriousBroker.ithttps://www.fda.gov/media/152162/download  This test is no t yet approved or cleared by the [place] FDA and  has been authorized for detection and/or diagnosis of SARS-CoV-2 by FDA under an Emergency Use Authorization (EUA). This EUA will remain  in effect (meaning this test can be used) for the duration of the COVID-19 declaration under Section 564(b)(1) of the Act, 21 U.S.C.section 360bbb-3(b)(1), unless the authorization is terminated  or revoked sooner.       Influenza A by PCR NEGATIVE NEGATIVE   Influenza B by PCR NEGATIVE NEGATIVE    Comment: (NOTE) The Xpert Xpress SARS-CoV-2/FLU/RSV plus assay is intended as an aid in the diagnosis of influenza from Nasopharyngeal swab specimens and should not be used as a sole basis for treatment. Nasal washings and aspirates are unacceptable for Xpert Xpress SARS-CoV-2/FLU/RSV testing.  Fact Sheet for Patients: BloggerCourse.comhttps://www.fda.gov/media/152166/download  Fact Sheet for Healthcare Providers: SeriousBroker.ithttps://www.fda.gov/media/152162/download  This test is not yet approved or cleared by the [place] FDA and has been authorized for detection and/or diagnosis of SARS-CoV-2 by FDA under an Emergency Use Authorization (EUA). This EUA will remain in effect (meaning this test can be used) for the duration of the COVID-19 declaration under Section 564(b)(1) of the Act, 21 U.S.C. section 360bbb-3(b)(1), unless the authorization is terminated or revoked.     Resp Syncytial Virus by PCR NEGATIVE NEGATIVE    Comment: (NOTE) Fact Sheet for Patients: BloggerCourse.comhttps://www.fda.gov/media/152166/download  Fact Sheet for Healthcare Providers: SeriousBroker.ithttps://www.fda.gov/media/152162/download  This test is not yet approved or cleared by the [place] FDA and has been authorized for detection and/or diagnosis of SARS-CoV-2 by FDA under an Emergency Use  Authorization (EUA). This EUA will remain in effect (meaning this test can be used) for the duration of the COVID-19 declaration under Section 564(b)(1) of the Act, 21 U.S.C. section 360bbb-3(b)(1), unless the authorization is terminated or revoked.  Performed at [facility], [address] [place], [place] [address]     Blood Alcohol level:  Lab Results  Component Value Date   [employer]amar[employer] LLCETH <10 05/01/2022   ETH <5 05/03/2016    Metabolic Disorder Labs:  Lab Results  Component Value Date   HGBA1C 5.2 05/06/2016   MPG 103 05/06/2016   Lab Results  Component Value Date   PROLACTIN 22.5 (H) 05/06/2016   Lab Results  Component Value Date   CHOL 125 05/06/2016   TRIG 180 (H) 05/06/2016   HDL 42 05/06/2016   CHOLHDL 3.0 05/06/2016   VLDL 36 05/06/2016   LDLCALC 47 05/06/2016    Current Medications: Current Facility-Administered Medications  Medication Dose Route Frequency Provider Last Rate Last Admin   albuterol (VENTOLIN HFA) 108 (90 Base) MCG/ACT inhaler 1-2 puff  1-2 puff Inhalation Q4H PRN Eligha Bridegroomoleman, Mikaela, NP       alum & mag hydroxide-simeth (MAALOX/MYLANTA) 200-200-20 MG/5ML suspension 30 mL  30 mL Oral Q6H PRN Eligha Bridegroomoleman, Mikaela, NP  ibuprofen (ADVIL) tablet 600 mg  600 mg Oral Q8H PRN [name], NP   600 mg at 05/02/22 1644   influenza vac split quadrivalent PF (FLUARIX) injection 0.5 mL  0.5 mL Intramuscular Tomorrow-1000 [name], [name], MD       lurasidone (LATUDA) tablet 40 mg  40 mg Oral QPM [name], NP   40 mg at 05/02/22 1644   magnesium hydroxide (MILK OF MAGNESIA) suspension 30 mL  30 mL Oral Daily PRN [name], NP       melatonin tablet 5 mg  5 mg Oral QHS [name], NP       nicotine (NICODERM CQ - dosed in mg/24 hours) patch 21 mg  21 mg Transdermal Daily [name], NP   21 mg at 05/03/22 0844   ondansetron (ZOFRAN) tablet 4 mg  4 mg Oral Q8H PRN [name], NP       pneumococcal 20-valent conjugate  vaccine (PREVNAR 20) injection 0.5 mL  0.5 mL Intramuscular Tomorrow-1000 [name], [name], MD       traZODone (DESYREL) tablet 50 mg  50 mg Oral QHS PRN [name], NP       ziprasidone (GEODON) injection 20 mg  20 mg Intramuscular BID PRN [name], NP       PTA Medications: Medications Prior to Admission  Medication Sig Dispense Refill Last Dose   albuterol (PROVENTIL HFA;VENTOLIN HFA) 108 (90 Base) MCG/ACT inhaler Inhale 2 puffs into the lungs every 6 (six) hours as needed for wheezing or shortness of breath. 1 Inhaler 0    carbamazepine (TEGRETOL) 100 MG chewable tablet Chew 1 tablet (100 mg total) by mouth 2 (two) times daily after a meal. (Patient not taking: Reported on 05/01/2022) 60 tablet 0    hydrOXYzine (ATARAX/VISTARIL) 25 MG tablet Take 1 tablet (25 mg total) by mouth every 6 (six) hours as needed (anxiety/agitation). (Patient not taking: Reported on 05/01/2022) 60 tablet 0    lurasidone (LATUDA) 40 MG TABS tablet Take 1 tablet (40 mg total) by mouth daily with supper. (Patient not taking: Reported on 05/01/2022) 30 tablet 0    nicotine polacrilex (NICORETTE) 2 MG gum Take 1 each (2 mg total) by mouth as needed for smoking cessation. (Patient not taking: Reported on 05/01/2022) 100 tablet 0    nystatin cream (MYCOSTATIN) Apply to affected area 2 times daily (Patient not taking: Reported on 05/01/2022) 15 g 0    predniSONE (DELTASONE) 20 MG tablet Take 2 tablets (40 mg total) by mouth daily. (Patient not taking: Reported on 05/01/2022) 6 tablet 0    traZODone (DESYREL) 50 MG tablet Take 1 tablet (50 mg total) by mouth at bedtime as needed for sleep. (Patient not taking: Reported on 05/01/2022) 30 tablet 0    Musculoskeletal: Strength & Muscle Tone: within normal limits Gait & Station: normal Patient leans: Right  Psychiatric Specialty Exam:  Presentation  General Appearance:  Appropriate for Environment; Casual; Disheveled  Eye Contact: Fair  Speech: Clear  and Coherent; Pressured  Speech Volume: Increased  Handedness: Right  Mood and Affect  Mood: Depressed; Hopeless; Worthless  Affect: Depressed; Flat  Thought Process  Thought Processes: Coherent  Duration of Psychotic Symptoms: 6 months Past Diagnosis of Schizophrenia or Psychoactive disorder: No data recorded Descriptions of Associations:Intact  Orientation:Full (Time, Place and Person)  Thought Content:Logical  Hallucinations:Hallucinations: None Description of Auditory Hallucinations: Pt denies  Ideas of Reference:None  Suicidal Thoughts:Suicidal Thoughts: Yes, Passive SI Active Intent and/or Plan: -- (Denies) SI Passive Intent and/or  Plan: Without Intent; Without Plan; Without Access to Means  Homicidal Thoughts:Homicidal Thoughts: No  Sensorium  Memory: Immediate Fair; Recent Fair; Remote Fair  Judgment: Fair  Insight: Fair  Executive Functions  Concentration: Good  Attention Span: Good  Recall: [place] of Knowledge: Fair  Language: Good  Psychomotor Activity  Psychomotor Activity: Psychomotor Activity: Normal  Assets  Assets: Armed forces logistics/support/administrative officer; Housing; Physical Health; Social Support  Sleep  Sleep: Sleep: Good Number of Hours of Sleep: 6  Physical Exam: Physical Exam Vitals and nursing note reviewed.  HENT:     Head: Normocephalic.     Right Ear: External ear normal.     Left Ear: External ear normal.     Nose: Nose normal.     Mouth/Throat:     Mouth: Mucous membranes are moist.     Pharynx: Oropharynx is clear.  Eyes:     Extraocular Movements: Extraocular movements intact.     Conjunctiva/sclera: Conjunctivae normal.     Pupils: Pupils are equal, round, and reactive to light.  Cardiovascular:     Rate and Rhythm: Normal rate.     Pulses: Normal pulses.  Pulmonary:     Effort: Pulmonary effort is normal.  Abdominal:     Palpations: Abdomen is soft.  Genitourinary:    Comments: Deferred Musculoskeletal:         General: Normal range of motion.     Cervical back: Normal range of motion.  Skin:    General: Skin is warm.  Neurological:     General: No focal deficit present.     Mental Status: He is alert and oriented to person, place, and time.  Psychiatric:        Behavior: Behavior normal.    Review of Systems  Constitutional: Negative.   HENT: Negative.    Eyes: Negative.   Respiratory: Negative.          Has Hx of asthma  Cardiovascular: Negative.   Gastrointestinal: Negative.   Genitourinary: Negative.   Musculoskeletal: Negative.   Skin: Negative.   Neurological:  Positive for headaches (Hx of Ha).  Endo/Heme/Allergies: Negative.   Psychiatric/Behavioral:  Positive for depression, substance abuse and suicidal ideas. The patient is nervous/anxious and has insomnia.    Blood pressure 108/89, pulse (!) 101, temperature 97.8 F (36.6 C), temperature source Oral, resp. rate 18, height 5\' 9"  (1.753 m), weight 63 kg, SpO2 98 %. Body mass index is 20.53 kg/m.  Treatment Plan Summary: Daily contact with patient to assess and evaluate symptoms and progress in treatment and Medication management  Observation Level/Precautions:  15 minute checks  Laboratory:  CBC Chemistry Profile HbAIC UDS UA  Psychotherapy: Therapeutic milieu  Medications: See MAR  Consultations: Pending  Discharge Concerns: Compliance with medication  Estimated LOS: 5 to 7 days  Other:     Physician Treatment Plan for Primary Diagnosis: Suicidal ideation Long Term Goal(s): Improvement in symptoms so as ready for discharge  Short Term Goals: Ability to identify changes in lifestyle to reduce recurrence of condition will improve, Ability to verbalize feelings will improve, Ability to disclose and discuss suicidal ideas, Ability to demonstrate self-control will improve, Ability to identify and develop effective coping behaviors will improve, Ability to maintain clinical measurements within normal limits will  improve, Compliance with prescribed medications will improve, and Ability to identify triggers associated with substance abuse/mental health issues will improve  Physician Treatment Plan for Secondary Diagnosis: Principal Problem:   Suicidal ideation Bipolar disorder Depression Insomnia Agitation PTSD  Bipolar disorder: -- Initiate lurasidone Latuda 40 mg tablets p.o. every evening daily  PTSD: -- Initiate prazosin 1 mg tablets p.o. daily nightly  Insomnia: -- Initiate trazodone 50 mg p.o. nightly as needed -- Initiate melatonin tablets 5 mg p.o. at bedtime  Smoking cessation: -- Initiate nicotine (NicoDerm CQ-dose in mg/24 hours) patch 21 mg transdermal over 24 hours.  Agitation: -- Initiate ziprasidone Geodon IM 20 mg 2 times daily as needed -- Hydroxyzine 25 mg p.o. 3 times daily as needed (Education provided on rationales, benefits and possible side effects of psychotropic medication which may include weight gain and metabolic syndrome.  Patient verbalizes understanding.)  Asthma: -- Resume albuterol (ventolin HFA) 108 (90 base) MCG/ACT inhaler 1 to 2 puffs every 4 hours as needed for wheezing, shortness of breath  Other PRN Medications -Acetaminophen 650 mg every 6 as needed/mild pain -Maalox 30 mL oral every 4 as needed/digestion -Magnesium hydroxide 30 mL daily as needed/mild constipation --Zofran tablet 4 mg p.o. every 8 hours as as needed nausea  Safety and Monitoring: Voluntary admission to inpatient psychiatric unit for safety, stabilization and treatment Daily contact with patient to assess and evaluate symptoms and progress in treatment Patient's case to be discussed in multi-disciplinary team meeting Observation Level : q15 minute checks Vital signs: q12 hours Precautions: suicide, but pt currently verbally contracts for safety on unit    Discharge Planning: Social work and case management to assist with discharge planning and identification of hospital  follow-up needs prior to discharge Estimated LOS: 5-7 days Discharge Concerns: Need to establish a safety plan; Medication compliance and effectiveness Discharge Goals: Return home with outpatient referrals for mental health follow-up including medication management/psychotherapy.  I certify that inpatient services furnished can reasonably be expected to improve the patient's condition.    Laretta Bolster, [name] 1/2/202411:23 AM

## 2022-05-03 NOTE — Progress Notes (Signed)
   05/03/22 6811  15 Minute Checks  Location Bedroom  Visual Appearance Calm  Behavior Sleeping  Sleep (Behavioral Health Patients Only)  Calculate sleep? (Click Yes once per 24 hr at 0600 safety check) Yes  Documented sleep last 24 hours 7.75

## 2022-05-03 NOTE — [facility] Group Notes (Signed)
Pt attended group and participated in discussion. 

## 2022-05-03 NOTE — [facility] Suicide Risk Assessment (Addendum)
Suicide Risk Assessment  Admission Assessment    [facility] Admission Suicide Risk Assessment   Nursing information obtained from:  Patient Demographic factors:  Male, Low socioeconomic status Current Mental Status:  Suicidal ideation indicated by patient Loss Factors:  Loss of significant relationship Historical Factors:  Prior suicide attempts, Family history of suicide, Family history of mental illness or substance abuse, Anniversary of important loss, Impulsivity Risk Reduction Factors:  Responsible for children under 53 years of age  Total Time spent with patient: 1 hour Principal Problem: Suicidal ideation Diagnosis:  Principal Problem:   Suicidal ideation  Subjective Data:  Raymond Church is a 32 y.o. male with past medical history of asthma, depression, polysubstance usage, bipolar disorder who presents to [facility] from [facility] ED with complaint of suicidal ideation with plan to step out into traffic.  Patient states over the past month he has had multiple death in his family, and he is unable to cope effectively.  Previously on psychiatric medications, which he discontinued several years ago for no specific reason. Denies drug or alcohol use, reports he used to use drugs years ago but quit.  Currently reports marijuana usage of 1 g twice daily.  Recently quit smoking however, relapsed on May 01, 2022 due to being overwhelmed with his stressors.  Denies regular alcohol use. Patient currently lives with his Godmother.  Based on the clinical factors indicating below, patient is admitted for mood stabilization, medication management, and safety.  Continued Clinical Symptoms:    The "Alcohol Use Disorders Identification Test", Guidelines for Use in Primary Care, Second Edition.  World Pharmacologist [facility]). Score between 0-7:  no or low risk or alcohol related problems. Score between 8-15:  moderate risk of alcohol related problems. Score between  16-19:  high risk of alcohol related problems. Score 20 or above:  warrants further diagnostic evaluation for alcohol dependence and treatment.  CLINICAL FACTORS:   Severe Anxiety and/or Agitation Panic Attacks Bipolar Disorder:   Depressive phase Depression:   Anhedonia Hopelessness Impulsivity Insomnia Alcohol/Substance Abuse/Dependencies Obsessive-Compulsive Disorder More than one psychiatric diagnosis Unstable or Poor Therapeutic Relationship Previous Psychiatric Diagnoses and Treatments  Musculoskeletal: Strength & Muscle Tone: within normal limits Gait & Station: normal Patient leans: [name]  Psychiatric Specialty Exam:  Presentation  General Appearance:  Appropriate for Environment; Casual; Disheveled  Eye Contact: Fair  Speech: Clear and Coherent; Pressured  Speech Volume: Increased  Handedness: Right  Mood and Affect  Mood: Depressed; Hopeless; Worthless  Affect: Depressed; Flat  Thought Process  Thought Processes: Coherent  Descriptions of Associations:Intact  Orientation:Full (Time, Place and Person)  Thought Content:Logical  History of Schizophrenia/Schizoaffective disorder:No data recorded Duration of Psychotic Symptoms:No data recorded Hallucinations:Hallucinations: None Description of Auditory Hallucinations: Pt denies  Ideas of Reference:None  Suicidal Thoughts:Suicidal Thoughts: Yes, Passive SI Active Intent and/or Plan: -- (Denies) SI Passive Intent and/or Plan: Without Intent; Without Plan; Without Access to Means  Homicidal Thoughts:Homicidal Thoughts: No  Sensorium  Memory: Immediate Fair; Recent Fair; Remote Fair  Judgment: Fair  Insight: Fair  Executive Functions  Concentration: Good  Attention Span: Good  Recall: [place] of Knowledge: Fair  Language: Good  Psychomotor Activity  Psychomotor Activity: Psychomotor Activity: Normal  Assets  Assets: Armed forces logistics/support/administrative officer; Housing; Physical Health;  Social Support  Sleep  Sleep: Sleep: Good Number of Hours of Sleep: 6  Physical Exam: Physical Exam Vitals and nursing note reviewed.  HENT:     Head: Normocephalic.  Nose: Nose normal.     Mouth/Throat:     Mouth: Mucous membranes are moist.     Pharynx: Oropharynx is clear.  Eyes:     Extraocular Movements: Extraocular movements intact.     Conjunctiva/sclera: Conjunctivae normal.     Pupils: Pupils are equal, round, and reactive to light.  Cardiovascular:     Rate and Rhythm: Tachycardia present.     Comments: Blood pressure 108/89, pulse 101.  Nursing staff to recheck vital signs Pulmonary:     Effort: Pulmonary effort is normal.  Abdominal:     Palpations: Abdomen is soft.  Genitourinary:    Comments: Deferred Musculoskeletal:        General: Normal range of motion.     Cervical back: Normal range of motion.  Skin:    General: Skin is warm.  Neurological:     General: No focal deficit present.     Mental Status: He is alert and oriented to person, place, and time.  Psychiatric:        Behavior: Behavior normal.     Comments: He has restless    Review of Systems  Constitutional: Negative.   HENT: Negative.    Eyes: Negative.   Respiratory: Negative.    Cardiovascular: Negative.        Blood pressure 108/89, pulse 101.  Nursing staff to recheck vital signs  Gastrointestinal: Negative.   Genitourinary: Negative.   Musculoskeletal: Negative.   Skin: Negative.   Neurological: Negative.   Endo/Heme/Allergies: Negative.   Psychiatric/Behavioral:  Positive for depression, substance abuse and suicidal ideas. The patient is nervous/anxious and has insomnia.    Blood pressure 108/89, pulse (!) 101, temperature 97.8 F (36.6 C), temperature source Oral, resp. rate 18, height 5\' 9"  (1.753 m), weight 63 kg, SpO2 98 %. Body mass index is 20.53 kg/m.  COGNITIVE FEATURES THAT CONTRIBUTE TO RISK:  Polarized thinking    SUICIDE RISK:   Moderate:  Frequent  suicidal ideation with limited intensity, and duration, some specificity in terms of plans, no associated intent, good self-control, limited dysphoria/symptomatology, some risk factors present, and identifiable protective factors, including available and accessible social support.  PLAN OF CARE: Treatment Plan Summary: Daily contact with patient to assess and evaluate symptoms and progress in treatment and Medication management   Observation Level/Precautions:  15 minute checks  Laboratory:  CBC Chemistry Profile HbAIC UDS UA  Psychotherapy: Therapeutic milieu  Medications: See MAR  Consultations: Pending  Discharge Concerns: Compliance with medication  Estimated LOS: 5 to 7 days  Other:      Physician Treatment Plan for Primary Diagnosis: Suicidal ideation Long Term Goal(s): Improvement in symptoms so as ready for discharge   Short Term Goals: Ability to identify changes in lifestyle to reduce recurrence of condition will improve, Ability to verbalize feelings will improve, Ability to disclose and discuss suicidal ideas, Ability to demonstrate self-control will improve, Ability to identify and develop effective coping behaviors will improve, Ability to maintain clinical measurements within normal limits will improve, Compliance with prescribed medications will improve, and Ability to identify triggers associated with substance abuse/mental health issues will improve   Physician Treatment Plan for Secondary Diagnosis: Principal Problem:   Suicidal ideation Bipolar disorder Depression Insomnia Agitation PTSD   Bipolar disorder: -- Initiate lurasidone Latuda 40 mg tablets p.o. every evening daily  PTSD: --Initiate prazosin 1 mg tablet p.o. daily at bedtime   Insomnia: -- Initiate trazodone 50 mg p.o. nightly as needed -- Initiate melatonin tablets 5 mg  p.o. at bedtime   Smoking cessation: -- Initiate nicotine (NicoDerm CQ-dose in mg/24 hours) patch 21 mg transdermal over 24  hours.   Agitation: -- Initiate ziprasidone Geodon IM 20 mg 2 times daily as needed -- Hydroxyzine 25 mg p.o. 3 times daily as needed   Asthma: -- Resume albuterol (ventolin HFA) 108 (90 base) MCG/ACT inhaler 1 to 2 puffs every 4 hours as needed for wheezing, shortness of breath (Education provided on rationales, benefits and possible side effects of psychotropic medication which may include weight gain and metabolic syndrome.  Patient verbalizes understanding.)   Other PRN Medications -Acetaminophen 650 mg every 6 as needed/mild pain -Maalox 30 mL oral every 4 as needed/digestion -Magnesium hydroxide 30 mL daily as needed/mild constipation --Zofran tablet 4 mg p.o. every 8 hours as as needed nausea   Safety and Monitoring: Voluntary admission to inpatient psychiatric unit for safety, stabilization and treatment Daily contact with patient to assess and evaluate symptoms and progress in treatment Patient's case to be discussed in multi-disciplinary team meeting Observation Level : q15 minute checks Vital signs: q12 hours Precautions: suicide, but pt currently verbally contracts for safety on unit    Discharge Planning: Social work and case management to assist with discharge planning and identification of hospital follow-up needs prior to discharge Estimated LOS: 5-7 days Discharge Concerns: Need to establish a safety plan; Medication compliance and effectiveness Discharge Goals: Return home with outpatient referrals for mental health follow-up including medication management/psychotherapy.    I certify that inpatient services furnished can reasonably be expected to improve the patient's condition.   Cecilie Lowers, [name] 05/03/2022, 12:08 PM

## 2022-05-03 NOTE — [facility] Group Notes (Signed)
Pt did not attend group discussion 

## 2022-05-03 NOTE — Progress Notes (Signed)
   05/03/22 0800  Psych Admission Type (Psych Patients Only)  Admission Status Voluntary  Psychosocial Assessment  Patient Complaints Depression  Eye Contact Brief  Facial Expression Animated  Affect Appropriate to circumstance  Speech Logical/coherent  Interaction Assertive  Motor Activity Fidgety  Appearance/Hygiene In scrubs;Disheveled  Behavior Characteristics Cooperative;Appropriate to situation  Mood Depressed  Thought Process  Coherency WDL  Content WDL  Delusions None reported or observed  Perception WDL  Hallucination Auditory  Judgment Impaired  Confusion None  Danger to Self  Current suicidal ideation? Passive  Agreement Not to Harm Self Yes  Description of Agreement verbal  Danger to Others  Danger to Others None reported or observed

## 2022-05-04 ENCOUNTER — Encounter (HOSPITAL_COMMUNITY): Payer: Self-pay

## 2022-05-04 DIAGNOSIS — R45851 Suicidal ideations: Secondary | ICD-10-CM | POA: Diagnosis not present

## 2022-05-04 LAB — BASIC METABOLIC PANEL
Anion gap: 5 (ref 5–15)
BUN: 11 mg/dL (ref 6–20)
CO2: 24 mmol/L (ref 22–32)
Calcium: 8.4 mg/dL — ABNORMAL LOW (ref 8.9–10.3)
Chloride: 105 mmol/L (ref 98–111)
Creatinine, Ser: 0.9 mg/dL (ref 0.61–1.24)
GFR, Estimated: >60 mL/min (ref >60)
Glucose, Bld: 113 mg/dL — ABNORMAL HIGH (ref 70–99)
Potassium: 4 mmol/L (ref 3.5–5.1)
Sodium: 134 mmol/L — ABNORMAL LOW (ref 135–145)

## 2022-05-04 MED ORDER — LURASIDONE HCL 40 MG PO TABS
40.0000 mg | ORAL_TABLET | Freq: Every day | ORAL | Status: DC
  Administered 2022-05-04 – 2022-05-09 (×6): 40 mg via ORAL
  Filled 2022-05-04 (×8): qty 1

## 2022-05-04 MED ORDER — LURASIDONE HCL 60 MG PO TABS
60.0000 mg | ORAL_TABLET | Freq: Every day | ORAL | Status: DC
  Filled 2022-05-04: qty 1

## 2022-05-04 MED ORDER — LOPERAMIDE HCL 2 MG PO CAPS: 2.0000 mg | ORAL_CAPSULE | ORAL | Status: DC | PRN

## 2022-05-04 NOTE — Progress Notes (Signed)
[facility] MD Progress Note  05/04/2022 1:17 PM Raymond Church  MRN:  [id number]  Subjective: Raymond Church reports, "I feel better and adjusting to the hospital.  However I had diarrhea few times last night and nausea."  Reason for admission: Raymond Church is a 32 y.o. male with past medical history of asthma, depression, polysubstance usage, bipolar disorder who presents to [facility] from [facility] ED with complaint of suicidal ideation with plan to step out into traffic.  Patient states over the past month he has had multiple death in his family, and he is unable to cope effectively.  Previously on psychiatric medications, which he discontinued several years ago for no specific reason.   Today's assessment: Patient is seen and examined on [address] laying on his bed.  He appears calm, quiet and participating in the exam.  Personal hygiene needs attention, encouraged patient to take a shower today.  Chart reviewed and findings shared with the treatment team and discussed with attending psychiatrist.  He presents alert and oriented x 4, with speech clear, coherent, with normal volume and pattern.  Patient reports depressive mood and rates as 9/10, with 10 being the worst.  Patient continues on Latuda 40 mg p.o. daily.  Reports poor sleep quality, and anxiety and rated as 8/10 with 10 being the worst. Hydroxyzine 25 mg p.o. 3 times daily as needed increased to 50 mg p.o. 3 times daily as needed to improve sleep quality and anxiety.  Patient instructed to call the nursing staff for as needed anxiety medication.  Reports having diarrhea and nausea  last night, as needed Imodium ordered.  Instructed to call for as needed medication for nausea from nursing staff.  Encouraged patient to get out of the room and attend groups to improve his healing process.  Reports fair appetite, and drinking adequate fluid to improve hydration.  Patient did not appear to be responding to internal or  external stimuli during encounter.  No paranoia or delusional thinking observed during assessment.  He denies SI, HI, or AVH.  Principal Problem: Suicidal ideation  Diagnosis: Principal Problem:   Suicidal ideation Active Problems:   Cocaine use disorder, severe, dependence (HCC)   Cannabis use disorder, severe, dependence (HCC)   Polysubstance abuse (HCC)   Bipolar disorder, curr episode mixed, severe, with psychotic features (HCC)   MDMA abuse (HCC)  Total Time spent with patient: 45 minutes  Past Psychiatric History: Previous Psych Diagnoses: Suicidal ideation, bipolar disorder current episode mixed with severe psychotic features, cannabis use disorder severe, cocaine use disorder severe dependence, MDMA abuse, polysubstance use and abuse, with Prior inpatient treatment: Patient has been admitted twice to behavioral health Hospital in 2017 and 2018.   Current/prior outpatient treatment: Patient was treated at [facility] for polysubstance treatment Prior rehab hx: Applicable Psychotherapy hx: Treated at behavioral health Hospital History of suicide: Patient denies History of homicide or aggression: Patient denies Psychiatric medication history: Patient noncompliance with medications reports running out Psychiatric medication compliance history: Noncompliance Neuromodulation history: Not applicable Current Psychiatrist: None reported per Patient Current therapist: None reported by patient    Past Medical History:  Past Medical History:  Diagnosis Date   Asthma    Bipolar disorder (HCC)    Depression    Pneumonia     Past Surgical History:  Procedure Laterality Date   NO PAST SURGERIES     Family History:  Family History  Problem Relation Age of Onset   Bipolar disorder Mother  Drug abuse Mother    Family Psychiatric  History: Psych: Mom had bipolar disorder and depression Psych Rx: Patient does not remember treatment SA/HA: Mom had suicide attempts and homicidal  attempt Substance use family hx: Patient and mom was using crack cocaine.  Mom died in Aug 27, 2003  Social History:  Social History   Substance and Sexual Activity  Alcohol Use Never   Comment: denies     Social History   Substance and Sexual Activity  Drug Use Yes   Types: Marijuana, Methamphetamines   Comment: currently using    Social History   Socioeconomic History   Marital status: Single    Spouse name: Not on file   Number of children: Not on file   Years of education: Not on file   Highest education level: Not on file  Occupational History   Not on file  Tobacco Use   Smoking status: Every Day    Packs/day: 0.25    Years: 10.00    Total pack years: 2.50    Types: Cigarettes   Smokeless tobacco: Never   Tobacco comments:    refused  Vaping Use   Vaping Use: Never used  Substance and Sexual Activity   Alcohol use: Never    Comment: denies   Drug use: Yes    Types: Marijuana, Methamphetamines    Comment: currently using   Sexual activity: Yes  Other Topics Concern   Not on file  Social History Narrative   Not on file   Social Determinants of Health   Financial Resource Strain: Not on file  Food Insecurity: No Food Insecurity (05/02/2022)   Hunger Vital Sign    Worried About Running Out of Food in the Last Year: Never true    Ran Out of Food in the Last Year: Never true  Transportation Needs: No Transportation Needs (05/02/2022)   PRAPARE - Administrator, Civil Service (Medical): No    Lack of Transportation (Non-Medical): No  Physical Activity: Not on file  Stress: Not on file  Social Connections: Not on file   Additional Social History:    Sleep: Fair  Appetite:  Good  Current Medications: Current Facility-Administered Medications  Medication Dose Route Frequency Provider Last Rate Last Admin   albuterol (VENTOLIN HFA) 108 (90 Base) MCG/ACT inhaler 1-2 puff  1-2 puff Inhalation Q4H PRN Eligha Bridegroom, NP   2 puff at 05/03/22 1719    alum & mag hydroxide-simeth (MAALOX/MYLANTA) 200-200-20 MG/5ML suspension 30 mL  30 mL Oral Q6H PRN Eligha Bridegroom, NP       haloperidol (HALDOL) tablet 5 mg  5 mg Oral TID PRN Phineas Inches, MD       And   LORazepam (ATIVAN) tablet 2 mg  2 mg Oral TID PRN Phineas Inches, MD       And   diphenhydrAMINE (BENADRYL) capsule 50 mg  50 mg Oral TID PRN [name], Harrold Donath, MD       haloperidol lactate (HALDOL) injection 5 mg  5 mg Intramuscular TID PRN [name], Harrold Donath, MD       And   LORazepam (ATIVAN) injection 2 mg  2 mg Intramuscular TID PRN [name], Harrold Donath, MD       And   diphenhydrAMINE (BENADRYL) injection 50 mg  50 mg Intramuscular TID PRN [name], Harrold Donath, MD       hydrOXYzine (ATARAX) tablet 25 mg  25 mg Oral TID PRN Montay Vanvoorhis, Jesusita Oka, [name]       ibuprofen (ADVIL) tablet 600  mg  600 mg Oral Q8H PRN Eligha Bridegroom, NP   600 mg at 05/02/22 1644   lurasidone (LATUDA) tablet 40 mg  40 mg Oral Q supper [name], Harrold Donath, MD   40 mg at 05/03/22 1715   magnesium hydroxide (MILK OF MAGNESIA) suspension 30 mL  30 mL Oral Daily PRN Eligha Bridegroom, NP       melatonin tablet 5 mg  5 mg Oral QHS Eligha Bridegroom, NP   5 mg at 05/03/22 2134   nicotine (NICODERM CQ - dosed in mg/24 hours) patch 21 mg  21 mg Transdermal Daily Eligha Bridegroom, NP   21 mg at 05/04/22 1031   ondansetron (ZOFRAN) tablet 4 mg  4 mg Oral Q8H PRN Eligha Bridegroom, NP       prazosin (MINIPRESS) capsule 1 mg  1 mg Oral QHS Zakaria Fromer, Jesusita Oka, [name]   1 mg at 05/03/22 2136   traZODone (DESYREL) tablet 50 mg  50 mg Oral QHS PRN Eligha Bridegroom, NP       traZODone (DESYREL) tablet 50 mg  50 mg Oral QHS Cecilie Lowers, [name]   50 mg at 05/03/22 2134    Lab Results:  Results for orders placed or performed during the hospital encounter of 05/02/22 (from the past 48 hour(s))  Basic metabolic panel     Status: Abnormal   Collection Time: 05/04/22  6:28 AM  Result Value Ref Range   Sodium 134 (L) 135 - 145 mmol/L   Potassium  4.0 3.5 - 5.1 mmol/L   Chloride 105 98 - 111 mmol/L   CO2 24 22 - 32 mmol/L   Glucose, Bld 113 (H) 70 - 99 mg/dL    Comment: Glucose reference range applies only to samples taken after fasting for at least 8 hours.   BUN 11 6 - 20 mg/dL   Creatinine, Ser 0.27 0.61 - 1.24 mg/dL   Calcium 8.4 (L) 8.9 - 10.3 mg/dL   GFR, Estimated >74 >12 mL/min    Comment: (NOTE) Calculated using the CKD-EPI Creatinine Equation (2021)    Anion gap 5 5 - 15    Comment: Performed at [facility], [address] [place], [place] [address]    Blood Alcohol level:  Lab Results  Component Value Date   [facility] <10 05/01/2022   ETH <5 05/03/2016    Metabolic Disorder Labs: Lab Results  Component Value Date   HGBA1C 5.2 05/06/2016   MPG 103 05/06/2016   Lab Results  Component Value Date   PROLACTIN 22.5 (H) 05/06/2016   Lab Results  Component Value Date   CHOL 125 05/06/2016   TRIG 180 (H) 05/06/2016   HDL 42 05/06/2016   CHOLHDL 3.0 05/06/2016   VLDL 36 05/06/2016   LDLCALC 47 05/06/2016    Physical Findings: AIMS:  , ,  ,  ,    CIWA:    COWS:     Musculoskeletal: Strength & Muscle Tone: within normal limits Gait & Station: normal Patient leans: [name]  Psychiatric Specialty Exam:  Presentation  General Appearance:  Disheveled; Appropriate for Environment  Eye Contact: Good  Speech: Clear and Coherent; Normal Rate  Speech Volume: Normal  Handedness: Right  Mood and Affect  Mood: Anxious; Depressed; Hopeless; Worthless  Affect: Librarian, academic Processes: Coherent  Descriptions of Associations:Intact  Orientation:Full (Time, Place and Person)  Thought Content:Logical  History of Schizophrenia/Schizoaffective disorder:No data recorded Duration of Psychotic Symptoms:No data recorded Hallucinations:Hallucinations: None Description of Auditory Hallucinations:  Patient denies  Ideas of Reference:None  Suicidal  Thoughts:Suicidal Thoughts: No SI Active Intent and/or Plan: -- (Denies) SI Passive Intent and/or Plan: -- (Denies)  Homicidal Thoughts:  Sensorium  Memory: Immediate Fair; Recent Fair  Judgment: Fair  Insight: Fair  Executive Functions  Concentration: Good  Attention Span: Good  Recall: [place] of Knowledge: Fair  Language: Good  Psychomotor Activity  Psychomotor Activity: Psychomotor Activity: Normal  Assets  Assets: Communication Skills; Social Support; Physical Health  Sleep  Sleep: Sleep: Poor Number of Hours of Sleep: 2  Physical Exam: Physical Exam Vitals and nursing note reviewed.  HENT:     Head: Normocephalic.     Nose: Nose normal.     Mouth/Throat:     Mouth: Mucous membranes are moist.     Pharynx: Oropharynx is clear.  Eyes:     Conjunctiva/sclera: Conjunctivae normal.     Pupils: Pupils are equal, round, and reactive to light.  Cardiovascular:     Rate and Rhythm: Tachycardia present.     Pulses: Normal pulses.     Comments: Blood pressure 118/78, pulse 109.  Nursing staff to recheck vital signs Pulmonary:     Effort: Pulmonary effort is normal.  Abdominal:     Palpations: Abdomen is soft.  Genitourinary:    Comments: Deferred Musculoskeletal:        General: Normal range of motion.     Cervical back: Normal range of motion.  Skin:    General: Skin is warm.  Neurological:     General: No focal deficit present.     Mental Status: He is alert and oriented to person, place, and time.  Psychiatric:        Behavior: Behavior normal.    Review of Systems  Constitutional: Negative.   HENT: Negative.    Eyes: Negative.   Respiratory: Negative.    Cardiovascular: Negative.        Blood pressure 118/78, pulse 109.  Nursing staff to recheck vital signs.  Gastrointestinal: Negative.   Genitourinary: Negative.   Musculoskeletal: Negative.   Skin: Negative.   Neurological: Negative.   Endo/Heme/Allergies: Negative.    Psychiatric/Behavioral:  Positive for depression and substance abuse. The patient is nervous/anxious and has insomnia.    Blood pressure 118/78, pulse (!) 109, temperature 98.9 F (37.2 C), temperature source Oral, resp. rate 18, height 5\' 9"  (1.753 m), weight 63 kg, SpO2 98 %. Body mass index is 20.53 kg/m.  Treatment Plan Summary: Daily contact with patient to assess and evaluate symptoms and progress in treatment and Medication management  Observation Level/Precautions:  15 minute checks  Laboratory:  CBC Chemistry Profile HbAIC UDS UA  Psychotherapy: Therapeutic milieu  Medications: See MAR  Consultations: Pending  Discharge Concerns: Compliance with medication  Estimated LOS: 5 to 7 days  Other:      Physician Treatment Plan for Primary Diagnosis: Suicidal ideation Long Term Goal(s): Improvement in symptoms so as ready for discharge   Short Term Goals: Ability to identify changes in lifestyle to reduce recurrence of condition will improve, Ability to verbalize feelings will improve, Ability to disclose and discuss suicidal ideas, Ability to demonstrate self-control will improve, Ability to identify and develop effective coping behaviors will improve, Ability to maintain clinical measurements within normal limits will improve, Compliance with prescribed medications will improve, and Ability to identify triggers associated with substance abuse/mental health issues will improve   Physician Treatment Plan for Secondary Diagnosis: Principal Problem:   Suicidal ideation Bipolar disorder Depression  Insomnia Agitation PTSD   Bipolar disorder: -- Initiate lurasidone Latuda 40 mg tablets p.o. every evening daily   PTSD: -- Initiate prazosin 1 mg tablets p.o. daily nightly   Insomnia: -- Initiate trazodone 50 mg p.o. nightly as needed -- Initiate melatonin tablets 5 mg p.o. at bedtime   Smoking cessation: -- Initiate nicotine (NicoDerm CQ-dose in mg/24 hours) patch 21 mg  transdermal over 24 hours.   Agitation: -- Initiate ziprasidone Geodon IM 20 mg 2 times daily as needed -- Hydroxyzine 50 mg p.o. 3 times daily as needed (Education provided on rationales, benefits and possible side effects of psychotropic medication which may include weight gain and metabolic syndrome.  Patient verbalizes understanding.)   Asthma: -- Resume albuterol (ventolin HFA) 108 (90 base) MCG/ACT inhaler 1 to 2 puffs every 4 hours as needed for wheezing, shortness of breath   Other PRN Medications -Acetaminophen 650 mg every 6 as needed/mild pain -Maalox 30 mL oral every 4 as needed/digestion -Magnesium hydroxide 30 mL daily as needed/mild constipation --Zofran tablet 4 mg p.o. every 8 hours as as needed nausea --Imodium capsule 2 mg p.o. daily as needed for diarrhea  Safety and Monitoring: Voluntary admission to inpatient psychiatric unit for safety, stabilization and treatment Daily contact with patient to assess and evaluate symptoms and progress in treatment Patient's case to be discussed in multi-disciplinary team meeting Observation Level : q15 minute checks Vital signs: q12 hours Precautions: suicide, but pt currently verbally contracts for safety on unit    Discharge Planning: Social work and case management to assist with discharge planning and identification of hospital follow-up needs prior to discharge Estimated LOS: 5-7 days Discharge Concerns: Need to establish a safety plan; Medication compliance and effectiveness Discharge Goals: Return home with outpatient referrals for mental health follow-up including medication management/psychotherapy.   I certify that inpatient services furnished can reasonably be expected to improve the patient's condition.    Laretta Bolster, [name] 05/04/2022, 1:17 PM

## 2022-05-04 NOTE — Progress Notes (Signed)
Patient is alert and oriented x4. Patient has been isolated in his room for some of the shift but did come out of his room in the afternoon and interacted with peers. Denies SI/HI/AVH but endorses depression and anxiety. He had some c/o stomach upset in the morning, but he said it subsided by the afternoon. Verbal encouragement provided and q68min checks continued.

## 2022-05-04 NOTE — [facility] Suicide Risk Assessment (Signed)
[place] INPATIENT:  Family/Significant Other Suicide Prevention Education  Suicide Prevention Education:  Education Completed; Raymond Church [phone] (Godmother/Foster Mother) has been identified by the patient as the family member/significant other with whom the patient will be residing, and identified as the person(s) who will aid the patient in the event of a mental health crisis (suicidal ideations/suicide attempt).  With written consent from the patient, the family member/significant other has been provided the following suicide prevention education, prior to the and/or following the discharge of the patient.  The suicide prevention education provided includes the following: Suicide risk factors Suicide prevention and interventions National Suicide Hotline telephone number [facility] assessment telephone number [facility] Emergency Assistance [place] and/or Residential Mobile Crisis Unit telephone number  Request made of family/significant other to: Remove weapons (e.g., guns, rifles, knives), all items previously/currently identified as safety concern.   Remove drugs/medications (over-the-counter, prescriptions, illicit drugs), all items previously/currently identified as a safety concern.  The family member/significant other verbalizes understanding of the suicide prevention education information provided.  The family member/significant other agrees to remove the items of safety concern listed above.  CSW spoke with Mrs. Raymond Church who states that she is the foster mother to the Pt.  She states that her son has been hallucinating and "walking around at night talking to himself".  She states that his behaviors have "scared some of the other foster children in the home".  She states that he has also been stealing from household members and "hiding some of the items inside the walls".  Mrs. Raymond Church states that her son has been using Cocaine and possibly "crushed  Ecstasy".  She states that after he was admitted to the hospital she found "white powder and a straw in his belongings".  She states that she would like to have a family meetings with the providers prior to discharge.  She states "I am not saying he can't come back but I do need him to be stable and able to sleep at night".  She states that there are no firearms in the home.  CSW completed SPE with Mrs. Raymond Church.   Raymond Church 05/04/2022, 1:41 PM

## 2022-05-04 NOTE — Group Note (Signed)
Recreation Therapy Group Note   Group Topic:Other  Group Date: 05/04/2022 Start Time: 1400 End Time: 1440 Facilitators: Dionicio Shelnutt-McCall, LRT,CTRS Location: [address] Dayroom   Goal Area(s) Addresses:  Patient will engage in pro-social way in music group.  Patient will follow directions of drum leader on the first prompt. Patient will demonstrate no behavioral issues during group.  Patient will identify if a reduction in stress level occurs as a result of participation in therapeutic drum circle.   Activity Description/Intervention: Therapeutic Drumming. Patients with peers and staff were given the opportunity to engage in a leader facilitated [place] with staff from the [employer], in partnership with [employer]. Nurse, adult and trained [employer], Devin Going leading with LRT observing and documenting intervention and pt response. This evidenced-based practice targets 7 areas of health and wellbeing in the human experience including: stress-reduction, exercise, self-expression, camaraderie/support, nurturing, spirituality, and music-making (leisure).    Affect/Mood: [name]   Participation Level: Did not attend    Clinical Observations/Individualized Feedback:     Plan: Continue to engage patient in RT group sessions 2-3x/week.   Deigo Alonso-McCall, LRT,CTRS 05/04/2022 3:54 PM

## 2022-05-04 NOTE — Progress Notes (Signed)
   05/03/22 2200  Psych Admission Type (Psych Patients Only)  Admission Status Voluntary  Psychosocial Assessment  Patient Complaints Isolation  Eye Contact Brief  Facial Expression Animated  Affect Appropriate to circumstance  Speech Logical/coherent  Interaction Assertive  Motor Activity Slow  Appearance/Hygiene Unremarkable  Behavior Characteristics Appropriate to situation  Mood Depressed  Thought Process  Coherency WDL  Content WDL  Delusions None reported or observed  Perception WDL  Hallucination None reported or observed  Judgment Poor  Confusion None  Danger to Self  Current suicidal ideation? Denies  Agreement Not to Harm Self Yes  Description of Agreement verbal  Danger to Others  Danger to Others None reported or observed

## 2022-05-04 NOTE — Progress Notes (Signed)
The patient attended the evening group but chose to take a nap.

## 2022-05-04 NOTE — BH IP Treatment Plan (Signed)
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[place])   Current Medications:  Current Facility-Administered Medications  Medication Dose Route Frequency Provider Last Rate Last Admin   albuterol (VENTOLIN HFA) 108 (90 Base) MCG/ACT inhaler 1-2 puff  1-2 puff Inhalation Q4H PRN [name], NP   2 puff at 05/03/22 1719   alum & mag hydroxide-simeth (MAALOX/MYLANTA) 200-200-20 MG/5ML suspension 30 mL  30 mL Oral Q6H PRN [name], NP       haloperidol (HALDOL) tablet 5 mg  5 mg Oral TID PRN [name], MD       And   LORazepam (ATIVAN) tablet 2 mg  2 mg Oral TID PRN [name], MD       And   diphenhydrAMINE (BENADRYL) capsule 50 mg  50 mg Oral TID PRN [name], [name], MD       haloperidol lactate (HALDOL) injection 5 mg  5 mg Intramuscular TID PRN [name], [name], MD       And   LORazepam (ATIVAN) injection 2 mg  2 mg Intramuscular TID PRN [name], [name], MD       And   diphenhydrAMINE (BENADRYL) injection 50 mg  50 mg Intramuscular TID PRN [name], [name], MD       hydrOXYzine (ATARAX) tablet 25 mg  25 mg Oral TID PRN [name], [name], [name]       ibuprofen (ADVIL) tablet 600 mg  600 mg Oral Q8H PRN [name], NP   600 mg at 05/02/22 1644   lurasidone (LATUDA) tablet 40 mg  40 mg Oral Q supper [name], [name], MD   40 mg at 05/03/22 1715   magnesium hydroxide (MILK OF MAGNESIA) suspension 30 mL  30 mL Oral Daily PRN [name], NP       melatonin tablet 5 mg  5 mg Oral QHS [name], NP   5 mg at 05/03/22 2134   nicotine  (NICODERM CQ - dosed in mg/24 hours) patch 21 mg  21 mg Transdermal Daily [name], NP   21 mg at 05/04/22 1031   ondansetron (ZOFRAN) tablet 4 mg  4 mg Oral Q8H PRN [name], NP       prazosin (MINIPRESS) capsule 1 mg  1 mg Oral QHS [name], [name], [name]   1 mg at 05/03/22 2136   traZODone (DESYREL) tablet 50 mg  50 mg Oral QHS PRN [name], NP       traZODone (DESYREL) tablet 50 mg  50 mg Oral QHS [name], [name], [name]   50 mg at 05/03/22 2134   PTA Medications: Medications Prior to Admission  Medication Sig Dispense Refill Last Dose   albuterol (PROVENTIL HFA;VENTOLIN HFA) 108 (90 Base) MCG/ACT inhaler Inhale 2 puffs into the lungs every 6 (six) hours as needed for wheezing or shortness of breath. 1 Inhaler 0    carbamazepine (TEGRETOL) 100 MG chewable tablet Chew 1 tablet (100 mg total) by mouth 2 (two) times daily after a meal. (Patient not taking: Reported on 05/01/2022) 60 tablet 0    hydrOXYzine (ATARAX/VISTARIL) 25 MG tablet Take 1 tablet (25 mg total) by mouth every 6 (six)  hours as needed (anxiety/agitation). (Patient not taking: Reported on 05/01/2022) 60 tablet 0    lurasidone (LATUDA) 40 MG TABS tablet Take 1 tablet (40 mg total) by mouth daily with supper. (Patient not taking: Reported on 05/01/2022) 30 tablet 0    nicotine polacrilex (NICORETTE) 2 MG gum Take 1 each (2 mg total) by mouth as needed for smoking cessation. (Patient not taking: Reported on 05/01/2022) 100 tablet 0    nystatin cream (MYCOSTATIN) Apply to affected area 2 times daily (Patient not taking: Reported on 05/01/2022) 15 g 0    predniSONE (DELTASONE) 20 MG tablet Take 2 tablets (40 mg total) by mouth daily. (Patient not taking: Reported on 05/01/2022) 6 tablet 0    traZODone (DESYREL) 50 MG tablet Take 1 tablet (50 mg total) by mouth at bedtime as needed for sleep. (Patient not taking: Reported on 05/01/2022) 30 tablet 0     Patient Stressors: Medication change or noncompliance    Patient  Strengths: Ability for insight  Active sense of humor  Average or above average intelligence  Capable of independent living  Communication skills   Treatment Modalities: Medication Management, Group therapy, Case management,  1 to 1 session with clinician, Psychoeducation, Recreational therapy.   Physician Treatment Plan for Primary Diagnosis: Suicidal ideation Long Term Goal(s): Improvement in symptoms so as ready for discharge   Short Term Goals: Ability to identify changes in lifestyle to reduce recurrence of condition will improve Ability to verbalize feelings will improve Ability to disclose and discuss suicidal ideas Ability to demonstrate self-control will improve Ability to identify and develop effective coping behaviors will improve Ability to maintain clinical measurements within normal limits will improve Compliance with prescribed medications will improve Ability to identify triggers associated with substance abuse/mental health issues will improve  Medication Management: Evaluate patient's response, side effects, and tolerance of medication regimen.  Therapeutic Interventions: 1 to 1 sessions, Unit Group sessions and Medication administration.  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[place])  Long Term Goal(s): Improvement in symptoms so as ready for discharge   Short Term Goals: Ability to identify changes in lifestyle to reduce recurrence of condition will improve Ability to verbalize feelings will improve Ability to disclose and discuss suicidal ideas Ability to demonstrate self-control will improve Ability to identify and develop effective coping  behaviors will improve Ability to maintain clinical measurements within normal limits will improve Compliance with prescribed medications will improve Ability to identify triggers associated with substance abuse/mental health issues will improve     Medication Management: Evaluate patient's response, side effects, and tolerance of medication regimen.  Therapeutic Interventions: 1 to 1 sessions, Unit Group sessions and Medication administration.  Evaluation of Outcomes: Not Met   RN Treatment Plan for Primary Diagnosis: Suicidal ideation Long Term Goal(s): Knowledge of disease and therapeutic regimen to maintain health will improve  Short Term Goals: Ability to remain free from injury will improve, Ability to participate in decision making will improve, Ability to verbalize feelings will improve, Ability to disclose and discuss suicidal ideas, and Ability to identify and develop effective coping behaviors will improve  Medication Management: RN will administer medications as ordered by provider, will assess and evaluate patient's response and  provide education to patient for prescribed medication. RN will report any adverse and/or side effects to prescribing provider.  Therapeutic Interventions: 1 on 1 counseling sessions, Psychoeducation, Medication administration, Evaluate responses to treatment, Monitor vital signs and CBGs as ordered, Perform/monitor CIWA, COWS, AIMS and Fall Risk screenings as ordered, Perform wound care treatments as ordered.  Evaluation of Outcomes: Not Met   LCSW Treatment Plan for Primary Diagnosis: Suicidal ideation Long Term Goal(s): Safe transition to appropriate next level of care at discharge, Engage patient in therapeutic group addressing interpersonal concerns.  Short Term Goals: Engage patient in aftercare planning with referrals and resources, Increase social support, Increase emotional regulation, Facilitate acceptance of mental health diagnosis and  concerns, Identify triggers associated with mental health/substance abuse issues, and Increase skills for wellness and recovery  Therapeutic Interventions: Assess for all discharge needs, 1 to 1 time with Social worker, Explore available resources and support systems, Assess for adequacy in community support network, Educate family and significant other(s) on suicide prevention, Complete Psychosocial Assessment, Interpersonal group therapy.  Evaluation of Outcomes: Not Met   Progress in Treatment: Attending groups: Yes. Participating in groups: Yes. Taking medication as prescribed: Yes. Toleration medication: Yes. Family/Significant other contact made: Yes, individual(s) contacted:  Godmother  Patient understands diagnosis: Yes. Discussing patient identified problems/goals with staff: Yes. Medical problems stabilized or resolved: Yes. Denies suicidal/homicidal ideation: Yes. Issues/concerns per patient self-inventory: No.   New problem(s) identified: No, Describe:  None reported   New Short Term/Long Term Goal(s): medication stabilization, elimination of SI thoughts, development of comprehensive mental wellness plan.   Patient Goals: "To get the thoughts out of my head"  Discharge Plan or Barriers: Patient recently admitted. CSW will continue to follow and assess for appropriate referrals and possible discharge planning.   Reason for Continuation of Hospitalization: Anxiety Depression Medication stabilization Suicidal ideation  Estimated Length of Stay: 3 to 7 days   Last [place] Suicide Severity Risk Score: [place] Admission (Current) from 05/02/2022 in [facility] ED from 05/01/2022 in [place] High Risk High Risk       Last PHQ 2/9 Scores:     No data to display          Scribe for Treatment Team: Darleen Crocker, Latanya Presser 05/04/2022 1:16 PM

## 2022-05-05 MED ORDER — ENSURE ENLIVE PO LIQD
237.0000 mL | Freq: Two times a day (BID) | ORAL | Status: DC
  Administered 2022-05-06 – 2022-05-09 (×6): 237 mL via ORAL
  Filled 2022-05-05 (×13): qty 237

## 2022-05-05 MED ORDER — ACETAMINOPHEN 325 MG PO TABS: 650.0000 mg | ORAL_TABLET | Freq: Four times a day (QID) | ORAL | Status: DC | PRN

## 2022-05-05 NOTE — Progress Notes (Signed)
NUTRITION ASSESSMENT  Pt identified as at risk on the Malnutrition Screen Tool  INTERVENTION: Supplements: Ensure Plus High Protein po BID, each supplement provides 350 kcal and 20 grams of protein.   NUTRITION DIAGNOSIS: Unintentional weight loss related to sub-optimal intake as evidenced by pt report.   Goal: Pt to meet >/= 90% of their estimated nutrition needs.  Monitor:  PO intake  Assessment:  Patient admitted for suicidal ideation. PMH of asthma, depression, polysubstance usage, bipolar disorder. Patient has no weight history since 2018 to assess recent changes.  Per RN and MD documentation, patient reports fair appetite and drinking fluids but not eating very well. Would benefit from nutrition supplements.   Height: Ht Readings from Last 1 Encounters:  05/02/22 5\' 9"  (1.753 m)    Weight: Wt Readings from Last 1 Encounters:  05/02/22 63 kg    Weight Hx: Wt Readings from Last 10 Encounters:  05/02/22 63 kg  05/04/16 68.9 kg  04/10/16 68.9 kg  12/23/15 67.2 kg  12/23/15 64.9 kg  12/19/15 64.9 kg  12/15/15 61.2 kg  12/13/15 63.5 kg  09/08/14 58.1 kg  03/08/14 68 kg    BMI:  Body mass index is 20.53 kg/m. Pt meets criteria for Normal based on current BMI.  Estimated Nutritional Needs: Kcal: 25-30 kcal/kg Protein: > 1 gram protein/kg Fluid: 1 ml/kcal  Diet Order:  Diet Order             Diet regular Room service appropriate? Yes; Fluid consistency: Thin  Diet effective now                  Pt is also offered choice of unit snacks mid-morning and mid-afternoon.    Lab results and medications reviewed.   Samson Frederic RD, LDN For contact information, refer to [facility].

## 2022-05-05 NOTE — Progress Notes (Signed)
[facility] MD Progress Note  05/05/2022 11:24 AM Raymond Church  MRN:  [id number]  Subjective: Raymond Church reports, "I did not sleep well last night due to my left pain from the shot I received yesterday. I also felt lightheaded."  Reason for admission: Raymond Church is a 32 y.o. male with past medical history of asthma, depression, polysubstance usage, bipolar disorder who presents to [facility] from [facility] ED with complaint of suicidal ideation with plan to step out into traffic.  Patient states over the past month he has had multiple death in his family, and he is unable to cope effectively.  Previously on psychiatric medications, which he discontinued several years ago for no specific reason.   Today's assessment: Raymond Church is seen and examined by this provider and attending psychiatrist on [address] sitting on his bed.  He appears calm, however complaining of pain on left deltoid injection site and occasional light headedness.  Encouraged to request pain medication of ibuprofen from nursing staff and to increase fluid intake due to possible dehydration.  Reports diarrhea and nausea from yesterday has improved.  Personal hygiene needs attention, encouraged patient to take a shower today, that it could help him feel better.  Chart reviewed and findings shared with the treatment team.  He presents alert and oriented x 4, with speech clear, coherent, with normal volume and pattern.  Patient reports his mood is less depressed compared to his mood at admission.  Patient continues on Latuda 40 mg p.o. daily, Hydroxyzine 50 mg p.o. 3 times daily as needed to improve sleep quality and anxiety.  Patient instructed to request from the nursing staff for as needed anxiety medication.  Encouraged patient to get out of the room and attend groups to enhance his healing process.  Reports fair appetite, and drinking adequate fluid to improve hydration.  Observed drinking Gatorade and water  during encounter.  Patient did not appear to be responding to internal or external stimuli during encounter.  No paranoia or delusional thinking observed during assessment.  He denies SI, (however, reports experiencing SI yesterday) HI, or AVH.  Vital signs reviewed with elevated pulse of 117, blood pressure 122/107 and temperature of 99.8.  Slight elevation of vital signs might be from system inflammatory process from influenza and pneumococcal injection. Will follow-up later today for improvement.  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   Current/prior outpatient treatment: Patient was treated at [facility] for polysubstance treatment Prior rehab hx: Applicable Psychotherapy hx: Treated at behavioral health Hospital History of suicide: Patient denies History of homicide or aggression: Patient denies Psychiatric medication history: Patient noncompliance with medications reports running out Psychiatric medication compliance history: Noncompliance Neuromodulation history: Not applicable Current Psychiatrist: None reported per Patient Current therapist: None reported by patient    Past Medical History:  Past Medical History:  Diagnosis Date   Asthma  Bipolar disorder (HCC)    Depression    Pneumonia      Past Surgical History:  Procedure Laterality Date   NO PAST SURGERIES     Family History:  Family History  Problem Relation Age of Onset   Bipolar disorder Mother    Drug abuse Mother    Family Psychiatric  History: Psych: Mom had bipolar disorder and depression Psych Rx: Patient does not remember treatment SA/HA: Mom had suicide attempts and homicidal attempt Substance use family hx: Patient and mom was using crack cocaine.  Mom died in 08-18-03  Social History:  Social History   Substance and Sexual Activity  Alcohol Use Never   Comment: denies     Social History   Substance and Sexual Activity  Drug Use Yes   Types: Marijuana, Methamphetamines   Comment: currently using    Social History   Socioeconomic History   Marital status: Single    Spouse name: Not on file   Number of children: Not on file   Years of education: Not on file   Highest education level: Not on file  Occupational History   Not on file  Tobacco Use   Smoking status: Every Day    Packs/day: 0.25    Years: 10.00    Total pack years: 2.50    Types: Cigarettes   Smokeless tobacco: Never   Tobacco comments:    refused  Vaping Use   Vaping Use: Never used  Substance and Sexual Activity   Alcohol use: Never    Comment: denies   Drug use: Yes    Types: Marijuana, Methamphetamines    Comment: currently using   Sexual activity: Yes  Other Topics Concern   Not on file  Social History Narrative   Not on file   Social Determinants of Health   Financial Resource Strain: Not on file  Food Insecurity: No Food Insecurity (05/02/2022)   Hunger Vital Sign    Worried About Running Out of Food in the Last Year: Never true    Ran Out of Food in the Last Year: Never true  Transportation Needs: No Transportation Needs (05/02/2022)   PRAPARE - Administrator, Civil Service (Medical): No    Lack of Transportation (Non-Medical): No  Physical Activity: Not on file  Stress: Not on file   Social Connections: Not on file   Additional Social History:    Sleep: Fair  Appetite:  Good  Current Medications: Current Facility-Administered Medications  Medication Dose Route Frequency Provider Last Rate Last Admin   acetaminophen (TYLENOL) tablet 650 mg  650 mg Oral Q6H PRN [name], Nathan, MD       albuterol (VENTOLIN HFA) 108 (90 Base) MCG/ACT inhaler 1-2 puff  1-2 puff Inhalation Q4H PRN Eligha Bridegroom, NP   2 puff at 05/03/22 1719   alum & mag hydroxide-simeth (MAALOX/MYLANTA) 200-200-20 MG/5ML suspension 30 mL  30 mL Oral Q6H PRN Eligha Bridegroom, NP       haloperidol (HALDOL) tablet 5 mg  5 mg Oral TID PRN Phineas Inches, MD       And   LORazepam (ATIVAN) tablet 2 mg  2 mg Oral TID PRN [name], Harrold Donath, MD       And   diphenhydrAMINE (BENADRYL) capsule 50 mg  50 mg Oral TID PRN [name], Harrold Donath, MD       haloperidol lactate (HALDOL) injection 5 mg  5 mg Intramuscular TID PRN Phineas Inches, MD       And  LORazepam (ATIVAN) injection 2 mg  2 mg Intramuscular TID PRN [name], Harrold Donath, MD       And   diphenhydrAMINE (BENADRYL) injection 50 mg  50 mg Intramuscular TID PRN [name], Harrold Donath, MD       hydrOXYzine (ATARAX) tablet 25 mg  25 mg Oral TID PRN Florice Hindle, Jesusita Oka, [name]       ibuprofen (ADVIL) tablet 600 mg  600 mg Oral Q8H PRN Eligha Bridegroom, NP   600 mg at 05/05/22 0802   loperamide (IMODIUM) capsule 2 mg  2 mg Oral PRN Brentton Wardlow, Jesusita Oka, [name]       lurasidone (LATUDA) tablet 40 mg  40 mg Oral Q supper Cecilie Lowers, [name]   40 mg at 05/04/22 1635   magnesium hydroxide (MILK OF MAGNESIA) suspension 30 mL  30 mL Oral Daily PRN Eligha Bridegroom, NP       melatonin tablet 5 mg  5 mg Oral QHS Eligha Bridegroom, NP   5 mg at 05/04/22 2113   nicotine (NICODERM CQ - dosed in mg/24 hours) patch 21 mg  21 mg Transdermal Daily Eligha Bridegroom, NP   21 mg at 05/05/22 0803   ondansetron (ZOFRAN) tablet 4 mg  4 mg Oral Q8H PRN Eligha Bridegroom, NP       prazosin  (MINIPRESS) capsule 1 mg  1 mg Oral QHS Lamont Glasscock, Jesusita Oka, [name]   1 mg at 05/04/22 2112   traZODone (DESYREL) tablet 50 mg  50 mg Oral QHS PRN Eligha Bridegroom, NP       traZODone (DESYREL) tablet 50 mg  50 mg Oral QHS Cecilie Lowers, [name]   50 mg at 05/04/22 2113    Lab Results:  Results for orders placed or performed during the hospital encounter of 05/02/22 (from the past 48 hour(s))  Basic metabolic panel     Status: Abnormal   Collection Time: 05/04/22  6:28 AM  Result Value Ref Range   Sodium 134 (L) 135 - 145 mmol/L   Potassium 4.0 3.5 - 5.1 mmol/L   Chloride 105 98 - 111 mmol/L   CO2 24 22 - 32 mmol/L   Glucose, Bld 113 (H) 70 - 99 mg/dL    Comment: Glucose reference range applies only to samples taken after fasting for at least 8 hours.   BUN 11 6 - 20 mg/dL   Creatinine, Ser 2.35 0.61 - 1.24 mg/dL   Calcium 8.4 (L) 8.9 - 10.3 mg/dL   GFR, Estimated >57 >32 mL/min    Comment: (NOTE) Calculated using the CKD-EPI Creatinine Equation (2021)    Anion gap 5 5 - 15    Comment: Performed at [facility], [address], [place], [place] [address]    Blood Alcohol level:  Lab Results  Component Value Date   [facility] <10 05/01/2022   ETH <5 05/03/2016    Metabolic Disorder Labs: Lab Results  Component Value Date   HGBA1C 5.2 05/06/2016   MPG 103 05/06/2016   Lab Results  Component Value Date   PROLACTIN 22.5 (H) 05/06/2016   Lab Results  Component Value Date   CHOL 125 05/06/2016   TRIG 180 (H) 05/06/2016   HDL 42 05/06/2016   CHOLHDL 3.0 05/06/2016   VLDL 36 05/06/2016   LDLCALC 47 05/06/2016    Physical Findings: AIMS:  , ,  ,  ,    CIWA:    COWS:     Musculoskeletal: Strength & Muscle Tone: within normal limits Gait &  Station: normal Patient leans: [name]  Psychiatric Specialty Exam:  Presentation  General Appearance:  Appropriate for Environment; Casual; Fairly Groomed  Eye Contact: Good  Speech: Clear and Coherent; Normal  Rate  Speech Volume: Normal  Handedness: Right  Mood and Affect  Mood: Anxious; Depressed  Affect: Congruent  Thought Process  Thought Processes: Coherent  Descriptions of Associations:Intact  Orientation:Full (Time, Place and Person)  Thought Content:Logical  History of Schizophrenia/Schizoaffective disorder:No data recorded Duration of Psychotic Symptoms:No data recorded Hallucinations:Hallucinations: None Description of Auditory Hallucinations: Denies  Ideas of Reference:None  Suicidal Thoughts:Suicidal Thoughts: No (Denies suicidal thoughts today, however states that he experienced SI thoughts yesterday.) SI Active Intent and/or Plan: -- (Denies) SI Passive Intent and/or Plan: -- (Denies)  Homicidal Thoughts:  Sensorium  Memory: Immediate Fair; Recent Fair; Remote Fair  Judgment: Fair  Insight: Fair  Executive Functions  Concentration: Good  Attention Span: Good  Recall: Fair  Fund of Knowledge: Fair  Language: Good  Psychomotor Activity  Psychomotor Activity: Psychomotor Activity: Normal  Assets  Assets: Communication Skills; Physical Health; Social Support  Sleep  Sleep: Sleep: Poor Number of Hours of Sleep: 2 (Report poor sleep hours due to pain from receiving influenza and pneumococcal injections yesterday.)  Physical Exam: Physical Exam Vitals and nursing note reviewed.  HENT:     Head: Normocephalic.     Nose: Nose normal.     Mouth/Throat:     Mouth: Mucous membranes are moist.     Pharynx: Oropharynx is clear.  Eyes:     Conjunctiva/sclera: Conjunctivae normal.     Pupils: Pupils are equal, round, and reactive to light.  Cardiovascular:     Rate and Rhythm: Tachycardia present.     Pulses: Normal pulses.     Comments: Blood pressure 118/78, pulse 109.  Nursing staff to recheck vital signs Pulmonary:     Effort: Pulmonary effort is normal.  Abdominal:     Palpations: Abdomen is soft.  Genitourinary:     Comments: Deferred Musculoskeletal:        General: Normal range of motion.     Cervical back: Normal range of motion.  Skin:    General: Skin is warm.  Neurological:     General: No focal deficit present.     Mental Status: He is alert and oriented to person, place, and time.  Psychiatric:        Mood and Affect: Mood normal.        Behavior: Behavior normal.    Review of Systems  Constitutional: Negative.   HENT: Negative.    Eyes: Negative.   Respiratory: Negative.    Cardiovascular: Negative.        Blood pressure 118/78, pulse 109.  Nursing staff to recheck vital signs.  Gastrointestinal: Negative.   Genitourinary: Negative.   Musculoskeletal: Negative.   Skin: Negative.   Neurological: Negative.   Endo/Heme/Allergies: Negative.   Psychiatric/Behavioral:  Positive for depression and substance abuse. The patient is nervous/anxious and has insomnia.    Blood pressure (!) 122/107, pulse (!) 117, temperature 99.8 F (37.7 C), temperature source Oral, resp. rate 18, height 5\' 9"  (1.753 m), weight 63 kg, SpO2 99 %. Body mass index is 20.53 kg/m.  Treatment Plan Summary: Daily contact with patient to assess and evaluate symptoms and progress in treatment and Medication management  Observation Level/Precautions:  15 minute checks  Laboratory:  CBC Chemistry Profile HbAIC UDS UA  Psychotherapy: Therapeutic milieu  Medications: See [facility]  Consultations: Pending  Discharge Concerns:  Compliance with medication  Estimated LOS: 5 to 7 days  Other:      Physician Treatment Plan for Primary Diagnosis: Suicidal ideation Long Term Goal(s): Improvement in symptoms so as ready for discharge   Short Term Goals: Ability to identify changes in lifestyle to reduce recurrence of condition will improve, Ability to verbalize feelings will improve, Ability to disclose and discuss suicidal ideas, Ability to demonstrate self-control will improve, Ability to identify and develop effective  coping behaviors will improve, Ability to maintain clinical measurements within normal limits will improve, Compliance with prescribed medications will improve, and Ability to identify triggers associated with substance abuse/mental health issues will improve   Physician Treatment Plan for Secondary Diagnosis: Principal Problem:   Suicidal ideation Bipolar disorder Depression Insomnia Agitation PTSD   Bipolar disorder: -- Initiate lurasidone Latuda 40 mg tablets p.o. every evening daily   PTSD: -- Initiate prazosin 1 mg tablets p.o. daily nightly   Insomnia: -- Initiate trazodone 50 mg p.o. nightly as needed -- Initiate melatonin tablets 5 mg p.o. at bedtime   Smoking cessation: -- Initiate nicotine (NicoDerm CQ-dose in mg/24 hours) patch 21 mg transdermal over 24 hours.   Agitation: -- Initiate ziprasidone Geodon IM 20 mg 2 times daily as needed -- Hydroxyzine 50 mg p.o. 3 times daily as needed (Education provided on rationales, benefits and possible side effects of psychotropic medication which may include weight gain and metabolic syndrome.  Patient verbalizes understanding.)   Asthma: -- Resume albuterol (ventolin HFA) 108 (90 base) MCG/ACT inhaler 1 to 2 puffs every 4 hours as needed for wheezing, shortness of breath   Other PRN Medications -Acetaminophen 650 mg every 6 as needed/mild pain -Maalox 30 mL oral every 4 as needed/digestion -Magnesium hydroxide 30 mL daily as needed/mild constipation --Zofran tablet 4 mg p.o. every 8 hours as as needed nausea --Imodium capsule 2 mg p.o. daily as needed for diarrhea  Safety and Monitoring: Voluntary admission to inpatient psychiatric unit for safety, stabilization and treatment Daily contact with patient to assess and evaluate symptoms and progress in treatment Patient's case to be discussed in multi-disciplinary team meeting Observation Level : q15 minute checks Vital signs: q12 hours Precautions: suicide, but pt currently  verbally contracts for safety on unit    Discharge Planning: Social work and case management to assist with discharge planning and identification of hospital follow-up needs prior to discharge Estimated LOS: 5-7 days Discharge Concerns: Need to establish a safety plan; Medication compliance and effectiveness Discharge Goals: Return home with outpatient referrals for mental health follow-up including medication management/psychotherapy.   I certify that inpatient services furnished can reasonably be expected to improve the patient's condition.    Laretta Bolster, [name] 05/05/2022, 11:24 AMPatient ID: Raymond Church, male   DOB: Jul 03, 1990, 32 y.o.   MRN: [id number]

## 2022-05-05 NOTE — Progress Notes (Signed)
Pt returned from gym with staff after having played basketball, and was visibly wheezing, with reports of shortness of  breath. Pt states '' it's an asthma attack. ''  Prn albuterol given with good effect.v/s obstained and pt given ice water and NP notified of event. Pt respirations improved. Will con't to monitor.

## 2022-05-05 NOTE — Group Note (Signed)
LCSW Group Therapy Note   Group Date: 05/05/2022 Start Time: 1100 End Time: 1200  Type of Therapy and Topic:  Group Therapy:  Strengths Exploration  Participation Level: Active  Description of Group: This group allows individuals to explore their strengths, learn to use strengths in new ways to improve well-being. Strengths-based interventions involve identifying strengths, understanding how they are used, and learning new ways to apply them. Individuals will identify their strengths, and then explore their roles in different areas of life (relationships, professional life, and personal fulfillment). Individuals will think about ways in which they currently use their strengths, along with new ways they could begin using them.   Therapeutic Goals Patient will verbalize two of their strengths Patient will identify how their strengths are currently used Patient will identify two new ways to apply their strengths  Patients will create a plan to apply their strengths in their daily lives     Summary of Patient Progress:  The Pt attended group and remained there the entire time.  The Pt accepted all worksheets and materials that were provided.  The Pt participated well and was appropriate with their peers.  The Pt demonstrated an understanding of the discussion topic.    Therapeutic Modalities Cognitive Behavioral Therapy Motivational Interviewing  Nichoel Digiulio M Monigue Spraggins, LCSWA 05/05/2022  2:41 PM    

## 2022-05-05 NOTE — Progress Notes (Signed)
Raymond Church did not attend wrap up group

## 2022-05-05 NOTE — Progress Notes (Signed)
   05/05/22 5397  15 Minute Checks  Location Bedroom  Visual Appearance Calm  Behavior Composed  Sleep (Behavioral Health Patients Only)  Calculate sleep? (Click Yes once per 24 hr at 0600 safety check) Yes  Documented sleep last 24 hours 11.5

## 2022-05-05 NOTE — Plan of Care (Signed)
  Problem: Education: Goal: Emotional status will improve Outcome: Progressing Goal: Verbalization of understanding the information provided will improve Outcome: Progressing   Problem: Activity: Goal: Interest or engagement in activities will improve Outcome: Progressing   Problem: Health Behavior/Discharge Planning: Goal: Compliance with treatment plan for underlying cause of condition will improve Outcome: Progressing   Problem: Safety: Goal: Periods of time without injury will increase Outcome: Progressing   Problem: Education: Goal: Knowledge of the prescribed therapeutic regimen will improve Outcome: Progressing   Problem: Coping: Goal: Coping ability will improve Outcome: Progressing   Problem: Health Behavior/Discharge Planning: Goal: Compliance with therapeutic regimen will improve Outcome: Progressing   Problem: Self-Concept: Goal: Level of anxiety will decrease Outcome: Progressing

## 2022-05-05 NOTE — Progress Notes (Addendum)
Pt presents with depressed mood, affect irritable. [name] was noted to have critically low blood pressure, and writer assessed immediately. MHT asked to recheck vitals as well. Pt did report he had been feeling dizzy and lightheaded when standing and noted orthostatic changes. Pt reports he had not been eating or drinking and did not eat breakfast. Large pitcher of gatorade given and encouraged pt to drink fluids. [name] also reports continued auditory hallucinations, stating '' yeah I was hearing them last night, I had to just talk to them last night til I could go to sleep. ''  Pt also reports feeling very depressed, continued suicidal thoughts, but no active plan to act on self harm. Pt also reports pain in arm due to flu shot , prn ibuprofen given.  Pt reassessed and with good effect noted. Pt did complete self inventory and rates his depression and hopelessness at 8/10 on scale, 10 being worst 0 being.  Pt rates his anxiety at 9/10 on scale, 10 being worst 0 being none.  Pt is safe, able to make his needs known. Will con't to monitor.

## 2022-05-05 NOTE — Progress Notes (Signed)
Patient stated depression 8/10, and anxiety 9/10. Patient denies SI,HI,& AVH. Patient compliant with scheduled HS medications. Staff provided support and encouragement to patient, patient verbally contracted for safety. Q 15 minutes safety checks ongoing, patient remains safe on the unit.

## 2022-05-06 MED ORDER — MELATONIN 3 MG PO TABS
3.0000 mg | ORAL_TABLET | Freq: Every day | ORAL | Status: DC
  Administered 2022-05-06 – 2022-05-09 (×4): 3 mg via ORAL
  Filled 2022-05-06 (×8): qty 1

## 2022-05-06 MED ORDER — TRAZODONE HCL 50 MG PO TABS
50.0000 mg | ORAL_TABLET | Freq: Every evening | ORAL | Status: DC | PRN
  Administered 2022-05-06 – 2022-05-09 (×4): 50 mg via ORAL
  Filled 2022-05-06 (×4): qty 1

## 2022-05-06 MED ORDER — RAMELTEON 8 MG PO TABS
8.0000 mg | ORAL_TABLET | Freq: Every day | ORAL | Status: DC
  Filled 2022-05-06 (×2): qty 1

## 2022-05-06 NOTE — Progress Notes (Signed)
   05/06/22 2200  Psych Admission Type (Psych Patients Only)  Admission Status Voluntary  Psychosocial Assessment  Patient Complaints Anxiety  Eye Contact Fair  Facial Expression Anxious;Sad  Affect Appropriate to circumstance  Speech Logical/coherent  Interaction Assertive  Motor Activity Slow  Appearance/Hygiene Unremarkable  Behavior Characteristics Cooperative  Mood Depressed;Sad  Aggressive Behavior  Effect No apparent injury  Thought Process  Coherency WDL  Content WDL  Delusions WDL  Perception WDL  Hallucination None reported or observed  Judgment WDL  Confusion None  Danger to Self  Current suicidal ideation? Passive  Self-Injurious Behavior Some self-injurious ideation observed or expressed.  No lethal plan expressed   Agreement Not to Harm Self Yes  Description of Agreement verbal contract for safety

## 2022-05-06 NOTE — Progress Notes (Addendum)
[facility] MD Progress Note  05/06/2022 4:19 PM Raymond Church  MRN:  [id number]  Subjective: "My grandma, granddad, and aunt died in the same month. My girl left me and took my kids".   HPI: Raymond Church is a 32 y.o. African American male with past history of asthma, depression, polysubstance use, and bipolar disorder who initially presented to [facility] with suicidal ideations with plan to step out in traffic; he was then admitted to [facility] for further stabilization and treatment. Patient reports multiple family deaths, recent break up resulting in the removal of his kids over the past month as factors contributing to his mental decline. Patient notes previously being on psychotropic medications in which he discontinued several years ago for no specific reason. Per chart review patient has history of multiple ED presentations within [facility] system for bipolar 1 disorder, suicidal ideations, dystonic drug reaction, aggressive behavior, anxiety dating back to 2015; previously admitted to [address] hall 05/04/2016.   24 hour chart review: Patient's vital signs slightly improving with blood pressure 98/59, pulse rate 117. He continues to attend meals; noted to increase his PO intake. Medication compliant. Intermittently visible in milieu participating in group activities. Patient did have incident after the gym where he was noted to be wheezing requiring PRN Albuterol.   Today's assessment: Patient observed in milieu playing cards with peers. Presents casual and disheveled. Calm and cooperative. Consistent eye contact. Depressed, dysphoric mood; congruent affect. Withdrawn and guarded. Possibly minimizing at times. When asked about any substance use patient reports occasional marijuana use; UDS+ amphetamines and THC. PDMP shows no active prescriptions for amphetamines and when asked patient denied any active use then looked away. He reports mother died in his arms when he was 67 years old; mom had history of schizophrenia  and bipolar. Dad has PTSD; [name]. States his children's mother left him and his kids (4) then came back and took the kids resulting in increased depressive symptoms with suicidal ideations. Currently lives in [place] with godmother; plan to return to godmother's home upon discharge. Last saw kids week before Christmas. States he has been unable to function since.  He reports ongoing nightmares; provider discussed ensuring he removes Nicotine patch at night in which patient states he was not aware of the need to do so. Patient is to report to staff is he has any other nightmares tonight.  He denies any active SI/HI/AVH; contracts for safety. Patient did not appear to be responding to internal or external stimuli during encounter. No paranoia or delusional thinking observed during assessment.   Principal Problem: Suicidal ideation  Diagnosis: Principal Problem:   Suicidal ideation Active Problems:   Cocaine use disorder, severe, dependence (HCC)   Cannabis use disorder, severe, dependence (HCC)   Polysubstance abuse (HCC)   Bipolar disorder, curr episode mixed, severe, with psychotic features (HCC)   MDMA abuse (HCC)  Total Time spent with patient: 45 minutes  Past Psychiatric History: Previous Psych Diagnoses: Suicidal ideation, bipolar disorder current episode mixed with severe psychotic features, cannabis use disorder severe, cocaine use disorder severe dependence, MDMA abuse, polysubstance use and abuse, with Prior inpatient treatment: Patient has been admitted twice to behavioral health [facility] in 2017 and 2018.   Current/prior outpatient treatment: Patient was treated at [facility] for polysubstance treatment   Past Medical History:  Past Medical History:  Diagnosis Date   Asthma    Bipolar disorder (HCC)    Depression    Pneumonia     Past Surgical History:  Procedure Laterality Date   NO PAST SURGERIES     Family History:  Family History  Problem Relation Age of Onset    Bipolar disorder Mother    Drug abuse Mother    Family Psychiatric  History: Psych: Mom had bipolar disorder and depression; Dad: PTSD, [name] Psych Rx: Patient does not remember treatment SA/HA: Mom had suicide attempts and homicidal attempt Substance use family hx: Patient and mom was using crack cocaine.  Mom died in Sep 04, 2003  Social History:  Social History   Substance and Sexual Activity  Alcohol Use Never   Comment: denies     Social History   Substance and Sexual Activity  Drug Use Yes   Types: Marijuana, Methamphetamines   Comment: currently using    Social History   Socioeconomic History   Marital status: Single    Spouse name: Not on file   Number of children: Not on file   Years of education: Not on file   Highest education level: Not on file  Occupational History   Not on file  Tobacco Use   Smoking status: Every Day    Packs/day: 0.25    Years: 10.00    Total pack years: 2.50    Types: Cigarettes   Smokeless tobacco: Never   Tobacco comments:    refused  Vaping Use   Vaping Use: Never used  Substance and Sexual Activity   Alcohol use: Never    Comment: denies   Drug use: Yes    Types: Marijuana, Methamphetamines    Comment: currently using   Sexual activity: Yes  Other Topics Concern   Not on file  Social History Narrative   Not on file   Social Determinants of Health   Financial Resource Strain: Not on file  Food Insecurity: No Food Insecurity (05/02/2022)   Hunger Vital Sign    Worried About Running Out of Food in the Last Year: Never true    Ran Out of Food in the Last Year: Never true  Transportation Needs: No Transportation Needs (05/02/2022)   PRAPARE - Administrator, Civil Service (Medical): No    Lack of Transportation (Non-Medical): No  Physical Activity: Not on file  Stress: Not on file  Social Connections: Not on file   Additional Social History:    Sleep: Fair  Appetite:  Good  Current  Medications: Current Facility-Administered Medications  Medication Dose Route Frequency Provider Last Rate Last Admin   acetaminophen (TYLENOL) tablet 650 mg  650 mg Oral Q6H PRN [name], Nathan, MD       albuterol (VENTOLIN HFA) 108 (90 Base) MCG/ACT inhaler 1-2 puff  1-2 puff Inhalation Q4H PRN Eligha Bridegroom, NP   2 puff at 05/05/22 1526   alum & mag hydroxide-simeth (MAALOX/MYLANTA) 200-200-20 MG/5ML suspension 30 mL  30 mL Oral Q6H PRN Eligha Bridegroom, NP       haloperidol (HALDOL) tablet 5 mg  5 mg Oral TID PRN Phineas Inches, MD       And   LORazepam (ATIVAN) tablet 2 mg  2 mg Oral TID PRN Phineas Inches, MD       And   diphenhydrAMINE (BENADRYL) capsule 50 mg  50 mg Oral TID PRN [name], Harrold Donath, MD       haloperidol lactate (HALDOL) injection 5 mg  5 mg Intramuscular TID PRN [name], Harrold Donath, MD       And   LORazepam (ATIVAN) injection 2 mg  2 mg Intramuscular TID PRN [name], Harrold Donath,  MD       And   diphenhydrAMINE (BENADRYL) injection 50 mg  50 mg Intramuscular TID PRN [name], [name], MD       feeding supplement (ENSURE ENLIVE / ENSURE PLUS) liquid 237 mL  237 mL Oral BID BM [name], [name], MD   237 mL at 05/06/22 1407   hydrOXYzine (ATARAX) tablet 25 mg  25 mg Oral TID PRN Laretta Bolster, [name]       ibuprofen (ADVIL) tablet 600 mg  600 mg Oral Q8H PRN [name], NP   600 mg at 05/05/22 0802   loperamide (IMODIUM) capsule 2 mg  2 mg Oral PRN [name], [name], [name]       lurasidone (LATUDA) tablet 40 mg  40 mg Oral Q supper Laretta Bolster, [name]   40 mg at 05/05/22 1701   magnesium hydroxide (MILK OF MAGNESIA) suspension 30 mL  30 mL Oral Daily PRN [name], NP       nicotine (NICODERM CQ - dosed in mg/24 hours) patch 21 mg  21 mg Transdermal Daily [name], NP   21 mg at 05/06/22 0755   ondansetron (ZOFRAN) tablet 4 mg  4 mg Oral Q8H PRN [name], NP       prazosin (MINIPRESS) capsule 1 mg  1 mg Oral QHS [name], [name], [name]   1 mg at  05/05/22 2132   ramelteon (ROZEREM) tablet 8 mg  8 mg Oral QHS [name], [name], MD        Lab Results:  No results found for this or any previous visit (from the past 48 hour(s)).   Blood Alcohol level:  Lab Results  Component Value Date   ETH <10 05/01/2022   ETH <5 16/01/9603    Metabolic Disorder Labs: Lab Results  Component Value Date   HGBA1C 5.2 05/06/2016   MPG 103 05/06/2016   Lab Results  Component Value Date   PROLACTIN 22.5 (H) 05/06/2016   Lab Results  Component Value Date   CHOL 125 05/06/2016   TRIG 180 (H) 05/06/2016   HDL 42 05/06/2016   CHOLHDL 3.0 05/06/2016   VLDL 36 05/06/2016   LDLCALC 47 05/06/2016    Physical Findings: AIMS:  , ,  ,  ,    CIWA:    COWS:     Musculoskeletal: Strength & Muscle Tone: within normal limits Gait & Station: normal Patient leans: [name]  Psychiatric Specialty Exam:  Presentation  General Appearance:  Casual; Disheveled  Eye Contact: Good  Speech: Clear and Coherent  Speech Volume: Normal  Handedness: Right  Mood and Affect  Mood: Angry; Depressed; Hopeless  Affect: Congruent  Thought Process  Thought Processes: Coherent  Descriptions of Associations:Intact  Orientation:Full (Time, Place and Person)  Thought Content:Logical  History of Schizophrenia/Schizoaffective disorder:No data recorded Duration of Psychotic Symptoms:No data recorded Hallucinations:Hallucinations: None Description of Auditory Hallucinations: Denies  Ideas of Reference:None  Suicidal Thoughts:Suicidal Thoughts: No SI Active Intent and/or Plan: -- (Denies) SI Passive Intent and/or Plan: -- (Denies)  Homicidal Thoughts:  Sensorium  Memory: Immediate Good; Recent Good  Judgment: Fair  Insight: Fair  Executive Functions  Concentration: Good  Attention Span: Good  Recall: Good  Fund of Knowledge: Good  Language: Good  Psychomotor Activity  Psychomotor Activity: Psychomotor Activity:  Normal  Assets  Assets: Communication Skills; Physical Health; Social Support; Resilience  Sleep  Sleep: Sleep: Fair Number of Hours of Sleep: 2 (Report poor sleep hours due to pain from receiving influenza and  pneumococcal injections yesterday.)  Physical Exam: Physical Exam Vitals and nursing note reviewed.  HENT:     Head: Normocephalic.     Nose: Nose normal.     Mouth/Throat:     Mouth: Mucous membranes are moist.     Pharynx: Oropharynx is clear.  Eyes:     Conjunctiva/sclera: Conjunctivae normal.     Pupils: Pupils are equal, round, and reactive to light.  Cardiovascular:     Rate and Rhythm: Tachycardia present.     Pulses: Normal pulses.     Comments: Blood pressure 98/53, pulse 117.  Nursing staff to recheck vital signs Pulmonary:     Effort: Pulmonary effort is normal.  Abdominal:     Palpations: Abdomen is soft.  Genitourinary:    Comments: Deferred Musculoskeletal:        General: Normal range of motion.     Cervical back: Normal range of motion.  Skin:    General: Skin is warm.  Neurological:     General: No focal deficit present.     Mental Status: He is alert and oriented to person, place, and time.  Psychiatric:        Mood and Affect: Mood normal.        Behavior: Behavior normal.    Review of Systems  Constitutional: Negative.   HENT: Negative.    Eyes: Negative.   Respiratory: Negative.    Cardiovascular: Negative.        Blood pressure 98/53, pulse 117.  Nursing staff to recheck vital signs  Gastrointestinal: Negative.   Genitourinary: Negative.   Musculoskeletal: Negative.   Skin: Negative.   Neurological: Negative.   Endo/Heme/Allergies: Negative.   Psychiatric/Behavioral:  Positive for depression and substance abuse. The patient is nervous/anxious and has insomnia.    Blood pressure (!) 98/59, pulse (!) 117, temperature 98.2 F (36.8 C), temperature source Oral, resp. rate 20, height 5\' 9"  (1.753 m), weight 63 kg, SpO2 99 %. Body  mass index is 20.53 kg/m.  Treatment Plan Summary: Daily contact with patient to assess and evaluate symptoms and progress in treatment and Medication management  Observation Level/Precautions:  15 minute checks  Laboratory:  CBC Chemistry Profile HbAIC UDS UA  Psychotherapy: Therapeutic milieu  Medications: See MAR  Consultations: Pending  Discharge Concerns: Compliance with medication  Estimated LOS: 5 to 7 days  Other:      Physician Treatment Plan for Primary Diagnosis: Suicidal ideation Long Term Goal(s): Improvement in symptoms so as ready for discharge   Short Term Goals: Ability to identify changes in lifestyle to reduce recurrence of condition will improve, Ability to verbalize feelings will improve, Ability to disclose and discuss suicidal ideas, Ability to demonstrate self-control will improve, Ability to identify and develop effective coping behaviors will improve, Ability to maintain clinical measurements within normal limits will improve, Compliance with prescribed medications will improve, and Ability to identify triggers associated with substance abuse/mental health issues will improve   Physician Treatment Plan for Secondary Diagnosis: Principal Problem:   Suicidal ideation Bipolar disorder Depression Insomnia Agitation PTSD   Bipolar disorder: -- Initiate lurasidone Latuda 40 mg tablets p.o. every evening daily   PTSD: -- Initiate prazosin 1 mg tablets p.o. daily nightly   Insomnia: -- Initiate trazodone 50 mg p.o. nightly as needed -- Initiate melatonin tablets 5 mg p.o. at bedtime   Smoking cessation: -- Initiate nicotine (NicoDerm CQ-dose in mg/24 hours) patch 21 mg transdermal over 24 hours.   Agitation: -- Initiate ziprasidone Geodon IM 20  mg 2 times daily as needed -- Hydroxyzine 50 mg p.o. 3 times daily as needed (Education provided on rationales, benefits and possible side effects of psychotropic medication which may include weight gain and  metabolic syndrome.  Patient verbalizes understanding.)   Asthma: -- Resume albuterol (ventolin HFA) 108 (90 base) MCG/ACT inhaler 1 to 2 puffs every 4 hours as needed for wheezing, shortness of breath   Other PRN Medications -Acetaminophen 650 mg every 6 as needed/mild pain -Maalox 30 mL oral every 4 as needed/digestion -Magnesium hydroxide 30 mL daily as needed/mild constipation --Zofran tablet 4 mg p.o. every 8 hours as as needed nausea --Imodium capsule 2 mg p.o. daily as needed for diarrhea  Safety and Monitoring: Voluntary admission to inpatient psychiatric unit for safety, stabilization and treatment Daily contact with patient to assess and evaluate symptoms and progress in treatment Patient's case to be discussed in multi-disciplinary team meeting Observation Level : q15 minute checks Vital signs: q12 hours Precautions: suicide, but pt currently verbally contracts for safety on unit    Discharge Planning: Social work and case management to assist with discharge planning and identification of hospital follow-up needs prior to discharge Estimated LOS: 5-7 days Discharge Concerns: Need to establish a safety plan; Medication compliance and effectiveness Discharge Goals: Return home with outpatient referrals for mental health follow-up including medication management/psychotherapy.   I certify that inpatient services furnished can reasonably be expected to improve the patient's condition.    Inda Merlin, NP 05/06/2022, 4:19 PMPatient ID: Raymond Church, male   DOB: Jun 27, 1990, 32 y.o.   MRN: [id number]

## 2022-05-06 NOTE — Group Note (Signed)
Date:  05/06/2022 Time:  4:53 PM  Group Topic/Focus:  Dimensions of Wellness:   The focus of this group is to introduce the topic of wellness and discuss the role each dimension of wellness plays in total health.    Participation Level:  Active  Participation Quality:  Appropriate  Affect:  Appropriate  Cognitive:  Appropriate  Insight: Appropriate  Engagement in Group:  Engaged  Modes of Intervention:  Exploration  Additional Comments:       Jerrye Beavers 05/06/2022, 4:53 PM

## 2022-05-06 NOTE — Progress Notes (Signed)
   05/06/22 0942  Psych Admission Type (Psych Patients Only)  Admission Status Voluntary  Psychosocial Assessment  Patient Complaints Anxiety;Depression  Eye Contact Brief  Facial Expression Flat  Affect Appropriate to circumstance  Speech Logical/coherent  Interaction Assertive  Motor Activity Slow  Appearance/Hygiene Unremarkable  Behavior Characteristics Appropriate to situation;Cooperative  Mood Depressed;Anxious  Thought Process  Coherency WDL  Content WDL  Delusions None reported or observed  Perception WDL  Hallucination None reported or observed  Judgment Poor  Confusion None  Danger to Self  Current suicidal ideation? Denies  Agreement Not to Harm Self Yes  Description of Agreement verbal  Danger to Others  Danger to Others None reported or observed

## 2022-05-06 NOTE — Progress Notes (Signed)
   05/06/22 0300  Psych Admission Type (Psych Patients Only)  Admission Status Voluntary  Psychosocial Assessment  Patient Complaints Anxiety;Depression  Eye Contact Brief  Facial Expression Flat  Affect Appropriate to circumstance  Speech Logical/coherent  Interaction Assertive  Motor Activity Slow  Appearance/Hygiene Unremarkable  Behavior Characteristics Appropriate to situation  Mood Depressed;Apprehensive  Thought Process  Coherency WDL  Content WDL  Delusions WDL  Perception WDL  Hallucination None reported or observed  Judgment Poor  Confusion None  Danger to Self  Current suicidal ideation? Denies  Agreement Not to Harm Self Yes  Description of Agreement verbal  Danger to Others  Danger to Others None reported or observed   Alert/oriented. Makes needs/concerns known to staff. Pleasant cooperative with staff. Denies SI/HI/A/V hallucinations. Med compliant. Patient states went to group. Will encourage continue compliance and progression towards goals. Verbally contracted for safety. Will continue to monitor.

## 2022-05-06 NOTE — BHH Counselor (Signed)
CSW spoke with Mrs. Raymond Church (Godmother) to cancel the family meeting at the Providers request.  Mrs. Raymond Church states that she would like a phone call over the weekend from a provider and would like to schedule another meeting for 3:00pm on Tuesday, 05/10/2022.  She states that "until I know for sure that he is taking his medications and doing well then I cannot let him back into the house".  She states that she would be interested in him receiving shelter information prior to discharging.  CSW explained that many shelters are full and that the Pt would likely have to discharge to the [facility] unless Mrs. Raymond Church can make other arrangements such as a hotel.  Mrs. Raymond Church states that she will see how she can help with his housing situation prior to discharge.  CSW will inform the providers of this information.

## 2022-05-06 NOTE — Plan of Care (Signed)
  Problem: Education: Goal: Knowledge of [facility] Education information/materials will improve Outcome: Progressing Goal: Emotional status will improve Outcome: Progressing Goal: Mental status will improve Outcome: Progressing Goal: Verbalization of understanding the information provided will improve Outcome: Progressing   Problem: Activity: Goal: Interest or engagement in activities will improve Outcome: Progressing Goal: Sleeping patterns will improve Outcome: Progressing   Problem: Coping: Goal: Ability to verbalize frustrations and anger appropriately will improve Outcome: Progressing Goal: Ability to demonstrate self-control will improve Outcome: Progressing   Problem: Health Behavior/Discharge Planning: Goal: Identification of resources available to assist in meeting health care needs will improve Outcome: Progressing Goal: Compliance with treatment plan for underlying cause of condition will improve Outcome: Progressing   Problem: Physical Regulation: Goal: Ability to maintain clinical measurements within normal limits will improve Outcome: Progressing   Problem: Safety: Goal: Periods of time without injury will increase Outcome: Progressing   Problem: Education: Goal: Utilization of techniques to improve thought processes will improve Outcome: Progressing Goal: Knowledge of the prescribed therapeutic regimen will improve Outcome: Progressing   Problem: Activity: Goal: Interest or engagement in leisure activities will improve Outcome: Progressing Goal: Imbalance in normal sleep/wake cycle will improve Outcome: Progressing   Problem: Coping: Goal: Coping ability will improve Outcome: Progressing Goal: Will verbalize feelings Outcome: Progressing   Problem: Health Behavior/Discharge Planning: Goal: Ability to make decisions will improve Outcome: Progressing Goal: Compliance with therapeutic regimen will improve Outcome: Progressing    Problem: Role Relationship: Goal: Will demonstrate positive changes in social behaviors and relationships Outcome: Progressing   Problem: Safety: Goal: Ability to disclose and discuss suicidal ideas will improve Outcome: Progressing Goal: Ability to identify and utilize support systems that promote safety will improve Outcome: Progressing   Problem: Self-Concept: Goal: Will verbalize positive feelings about self Outcome: Progressing Goal: Level of anxiety will decrease Outcome: Progressing   

## 2022-05-06 NOTE — Group Note (Signed)
Date:  05/06/2022 Time:  2:26 PM  Group Topic/Focus:  Goals Group:   The focus of this group is to help patients establish daily goals to achieve during treatment and discuss how the patient can incorporate goal setting into their daily lives to aide in recovery.    Participation Level:  Active  Participation Quality:  Appropriate  Affect:  Appropriate  Cognitive:  Appropriate  Insight: Appropriate  Engagement in Group:  Engaged  Modes of Intervention:  Discussion  Additional Comments:     Jerrye Beavers 05/06/2022, 2:26 PM

## 2022-05-07 NOTE — Progress Notes (Signed)
   05/07/22 2100  Psych Admission Type (Psych Patients Only)  Admission Status Voluntary  Psychosocial Assessment  Patient Complaints None  Eye Contact Fair  Facial Expression Flat  Affect Appropriate to circumstance  Speech Logical/coherent  Interaction Assertive  Motor Activity Slow  Appearance/Hygiene Unremarkable  Behavior Characteristics Cooperative;Appropriate to situation  Mood Pleasant  Aggressive Behavior  Effect No apparent injury  Thought Process  Coherency WDL  Content WDL  Delusions None reported or observed  Perception WDL  Hallucination None reported or observed  Judgment Limited  Confusion None  Danger to Self  Current suicidal ideation? Denies  Self-Injurious Behavior No self-injurious ideation or behavior indicators observed or expressed   Agreement Not to Harm Self Yes  Description of Agreement Verbal  Danger to Others  Danger to Others None reported or observed

## 2022-05-07 NOTE — Progress Notes (Signed)
[facility] MD Progress Note  05/07/2022 9:09 AM Keilyn Nadal  MRN:  [id number]  Subjective: "My grandma, granddad, and aunt died in the same month. My girl left me and took my kids".   HPI: Ahmar Pickrell is a 32 y.o. African American male with past history of asthma, depression, polysubstance use, and bipolar disorder who initially presented to [facility] with suicidal ideations with plan to step out in traffic; he was then admitted to [facility] for further stabilization and treatment. Patient reports multiple family deaths, recent break up resulting in the removal of his kids over the past month as factors contributing to his mental decline. Patient notes previously being on psychotropic medications in which he discontinued several years ago for no specific reason. Per chart review patient has history of multiple ED presentations within [facility] system for bipolar 1 disorder, suicidal ideations, dystonic drug reaction, aggressive behavior, anxiety dating back to August 11, 2013; previously admitted to [address] hall 05/04/2016.   24 hour chart review: Patient's vital signs slightly improving with blood pressure 110/69, pulse rate 109; Prazosin discontinued 05/06/21. He continues to attend meals; noted to increase his PO intake. Medication compliant; PRN Ibuprofen given for left shoulder pain. Remains visible in milieu participating in group activities. Plan for family meeting Tuesday. Slept 9.5 hours.   Today's assessment: Patient observed laying in bed. Presents casual and disheveled. Calm and cooperative. Consistent eye contact. Dysphic mood; congruent affect. Withdrawn and guarded.  He reports feeling okay. Denies any issues on unit. He denies any nightmares; stating he removed Nicotine patch last night. He reports consistent contact with godmother who he states is coming to visit him today.   He denies any active SI/HI/AVH; contracts for safety. Patient did not appear to be responding to internal or external stimuli during  encounter. No paranoia or delusional thinking observed during assessment. He remains medications compliant and denies any side effects. No medication changes noted for this time.   Principal Problem: Bipolar disorder, curr episode mixed, severe, with psychotic features (HCC)  Diagnosis: Principal Problem:   Bipolar disorder, curr episode mixed, severe, with psychotic features (HCC) Active Problems:   Cocaine use disorder, severe, dependence (HCC)   Cannabis use disorder, severe, dependence (HCC)   Polysubstance abuse (HCC)   MDMA abuse (HCC)   Suicidal ideation  Total Time spent with patient: 45 minutes  Past Psychiatric History: Previous Psych Diagnoses: Suicidal ideation, bipolar disorder current episode mixed with severe psychotic features, cannabis use disorder severe, cocaine use disorder severe dependence, MDMA abuse, polysubstance use and abuse, with Prior inpatient treatment: Patient has been [facility] to [facility] in 2015-08-12 and August 11, 2016.   Current/prior outpatient treatment: Patient was treated at [facility] for polysubstance treatment   Past Medical History:  Past Medical History:  Diagnosis Date   Asthma    Bipolar disorder (HCC)    Depression    Pneumonia     Past Surgical History:  Procedure Laterality Date   NO PAST SURGERIES     Family History:  Family History  Problem Relation Age of Onset   Bipolar disorder Mother    Drug abuse Mother    Family Psychiatric  History: Psych: Mom had bipolar disorder and depression; Dad: PTSD, [name] Psych Rx: Patient does not remember treatment SA/HA: Mom had suicide attempts and homicidal attempt Substance use family hx: Patient and mom was using crack cocaine.  Mom died in 2003-08-12  Social History:  Social History   Substance and Sexual Activity  Alcohol Use  Never   Comment: denies     Social History   Substance and Sexual Activity  Drug Use Yes   Types: Marijuana, Methamphetamines   Comment:  currently using    Social History   Socioeconomic History   Marital status: Single    Spouse name: Not on file   Number of children: Not on file   Years of education: Not on file   Highest education level: Not on file  Occupational History   Not on file  Tobacco Use   Smoking status: Every Day    Packs/day: 0.25    Years: 10.00    Total pack years: 2.50    Types: Cigarettes   Smokeless tobacco: Never   Tobacco comments:    refused  Vaping Use   Vaping Use: Never used  Substance and Sexual Activity   Alcohol use: Never    Comment: denies   Drug use: Yes    Types: Marijuana, Methamphetamines    Comment: currently using   Sexual activity: Yes  Other Topics Concern   Not on file  Social History Narrative   Not on file   Social Determinants of Health   Financial Resource Strain: Not on file  Food Insecurity: No Food Insecurity (05/02/2022)   Hunger Vital Sign    Worried About Running Out of Food in the Last Year: Never true    Ran Out of Food in the Last Year: Never true  Transportation Needs: No Transportation Needs (05/02/2022)   PRAPARE - Administrator, Civil ServiceTransportation    Lack of Transportation (Medical): No    Lack of Transportation (Non-Medical): No  Physical Activity: Not on file  Stress: Not on file  Social Connections: Not on file   Additional Social History:    Sleep: Fair  Appetite:  Good  Current Medications: Current Facility-Administered Medications  Medication Dose Route Frequency Provider Last Rate Last Admin   acetaminophen (TYLENOL) tablet 650 mg  650 mg Oral Q6H PRN [name], Nathan, MD       albuterol (VENTOLIN HFA) 108 (90 Base) MCG/ACT inhaler 1-2 puff  1-2 puff Inhalation Q4H PRN Eligha Bridegroomoleman, Mikaela, NP   2 puff at 05/05/22 1526   alum & mag hydroxide-simeth (MAALOX/MYLANTA) 200-200-20 MG/5ML suspension 30 mL  30 mL Oral Q6H PRN Eligha Bridegroomoleman, Mikaela, NP       haloperidol (HALDOL) tablet 5 mg  5 mg Oral TID PRN Phineas InchesMassengill, Nathan, MD       And   LORazepam  (ATIVAN) tablet 2 mg  2 mg Oral TID PRN Phineas InchesMassengill, Nathan, MD       And   diphenhydrAMINE (BENADRYL) capsule 50 mg  50 mg Oral TID PRN [name], Harrold DonathNathan, MD       haloperidol lactate (HALDOL) injection 5 mg  5 mg Intramuscular TID PRN [name], Harrold DonathNathan, MD       And   LORazepam (ATIVAN) injection 2 mg  2 mg Intramuscular TID PRN [name], Harrold DonathNathan, MD       And   diphenhydrAMINE (BENADRYL) injection 50 mg  50 mg Intramuscular TID PRN [name], Harrold DonathNathan, MD       feeding supplement (ENSURE ENLIVE / ENSURE PLUS) liquid 237 mL  237 mL Oral BID BM [name], Nathan, MD   237 mL at 05/06/22 1407   hydrOXYzine (ATARAX) tablet 25 mg  25 mg Oral TID PRN Cecilie LowersNtuen, [name], [name]   25 mg at 05/06/22 2108   ibuprofen (ADVIL) tablet 600 mg  600 mg Oral Q8H PRN Eligha Bridegroomoleman, Mikaela, NP  600 mg at 05/06/22 2016   loperamide (IMODIUM) capsule 2 mg  2 mg Oral PRN [name], [name], [name]       lurasidone (LATUDA) tablet 40 mg  40 mg Oral Q supper [name], [name], [name]   40 mg at 05/06/22 1639   magnesium hydroxide (MILK OF MAGNESIA) suspension 30 mL  30 mL Oral Daily PRN [name], NP       melatonin tablet 3 mg  3 mg Oral QHS [name], Nathan, MD   3 mg at 05/06/22 2109   nicotine (NICODERM CQ - dosed in mg/24 hours) patch 21 mg  21 mg Transdermal Daily [name], NP   21 mg at 05/06/22 0755   ondansetron (ZOFRAN) tablet 4 mg  4 mg Oral Q8H PRN [name], NP       traZODone (DESYREL) tablet 50 mg  50 mg Oral QHS PRN [name], MD   50 mg at 05/06/22 2108    Lab Results:  No results found for this or any previous visit (from the past 48 hour(s)).   Blood Alcohol level:  Lab Results  Component Value Date   ETH <10 05/01/2022   ETH <5 99991111    Metabolic Disorder Labs: Lab Results  Component Value Date   HGBA1C 5.2 05/06/2016   MPG 103 05/06/2016   Lab Results  Component Value Date   PROLACTIN 22.5 (H) 05/06/2016   Lab Results  Component Value Date   CHOL 125 05/06/2016    TRIG 180 (H) 05/06/2016   HDL 42 05/06/2016   CHOLHDL 3.0 05/06/2016   VLDL 36 05/06/2016   LDLCALC 47 05/06/2016    Physical Findings: AIMS:  , ,  ,  ,    CIWA:    COWS:     Musculoskeletal: Strength & Muscle Tone: within normal limits Gait & Station: normal Patient leans: [name]  Psychiatric Specialty Exam:  Presentation  General Appearance:  Casual; Disheveled  Eye Contact: Good  Speech: Clear and Coherent  Speech Volume: Normal  Handedness: Right  Mood and Affect  Mood: Angry; Depressed; Hopeless  Affect: Congruent  Thought Process  Thought Processes: Coherent  Descriptions of Associations:Intact  Orientation:Full (Time, Place and Person)  Thought Content:Logical  History of Schizophrenia/Schizoaffective disorder:No data recorded Duration of Psychotic Symptoms:No data recorded Hallucinations:Hallucinations: None  Ideas of Reference:None  Suicidal Thoughts:Suicidal Thoughts: No  Homicidal Thoughts:  Sensorium  Memory: Immediate Good; Recent Good  Judgment: Fair  Insight: Fair  Community education officer  Concentration: Good  Attention Span: Good  Recall: Good  Fund of Knowledge: Good  Language: Good  Psychomotor Activity  Psychomotor Activity: Psychomotor Activity: Normal  Assets  Assets: Communication Skills; Physical Health; Social Support; Resilience  Sleep  Sleep: Sleep: Fair  Physical Exam: Physical Exam Vitals and nursing note reviewed.  HENT:     Head: Normocephalic.     Nose: Nose normal.     Mouth/Throat:     Mouth: Mucous membranes are moist.     Pharynx: Oropharynx is clear.  Eyes:     Conjunctiva/sclera: Conjunctivae normal.     Pupils: Pupils are equal, round, and reactive to light.  Cardiovascular:     Rate and Rhythm: Tachycardia present.     Pulses: Normal pulses.     Comments: Blood pressure 98/53, pulse 117.  Nursing staff to recheck vital signs Pulmonary:     Effort: Pulmonary effort is  normal.  Abdominal:     Palpations: Abdomen is soft.  Genitourinary:  Comments: Deferred Musculoskeletal:        General: Normal range of motion.     Cervical back: Normal range of motion.  Skin:    General: Skin is warm.  Neurological:     General: No focal deficit present.     Mental Status: He is alert and oriented to person, place, and time.  Psychiatric:        Mood and Affect: Mood normal.        Behavior: Behavior normal.    Review of Systems  Constitutional: Negative.   HENT: Negative.    Eyes: Negative.   Respiratory: Negative.    Cardiovascular: Negative.        Blood pressure 98/53, pulse 117.  Nursing staff to recheck vital signs  Gastrointestinal: Negative.   Genitourinary: Negative.   Musculoskeletal: Negative.   Skin: Negative.   Neurological: Negative.   Endo/Heme/Allergies: Negative.   Psychiatric/Behavioral:  Positive for depression and substance abuse. The patient is nervous/anxious and has insomnia.    Blood pressure 110/69, pulse (!) 109, temperature 98.7 F (37.1 C), temperature source Oral, resp. rate 16, height 5\' 9"  (1.753 m), weight 63 kg, SpO2 99 %. Body mass index is 20.53 kg/m.  Treatment Plan Summary: Daily contact with patient to assess and evaluate symptoms and progress in treatment and Medication management  Observation Level/Precautions:  15 minute checks  Laboratory:  CBC Chemistry Profile HbAIC UDS UA  Psychotherapy: Therapeutic milieu  Medications: See MAR  Consultations: Pending  Discharge Concerns: Compliance with medication  Estimated LOS: 5 to 7 days  Other:      Physician Treatment Plan for Primary Diagnosis: Suicidal ideation Long Term Goal(s): Improvement in symptoms so as ready for discharge   Short Term Goals: Ability to identify changes in lifestyle to reduce recurrence of condition will improve, Ability to verbalize feelings will improve, Ability to disclose and discuss suicidal ideas, Ability to demonstrate  self-control will improve, Ability to identify and develop effective coping behaviors will improve, Ability to maintain clinical measurements within normal limits will improve, Compliance with prescribed medications will improve, and Ability to identify triggers associated with substance abuse/mental health issues will improve   Physician Treatment Plan for Secondary Diagnosis: Principal Problem:   Suicidal ideation Bipolar disorder Depression Insomnia Agitation PTSD   Bipolar disorder: -- Initiate lurasidone Latuda 40 mg tablets p.o. every evening daily   PTSD: -- Initiate prazosin 1 mg tablets p.o. daily nightly   Insomnia: -- Initiate trazodone 50 mg p.o. nightly as needed -- Initiate melatonin tablets 5 mg p.o. at bedtime   Smoking cessation: -- Initiate nicotine (NicoDerm CQ-dose in mg/24 hours) patch 21 mg transdermal over 24 hours.   Agitation: -- Initiate ziprasidone Geodon IM 20 mg 2 times daily as needed -- Hydroxyzine 50 mg p.o. 3 times daily as needed (Education provided on rationales, benefits and possible side effects of psychotropic medication which may include weight gain and metabolic syndrome.  Patient verbalizes understanding.)   Asthma: -- Resume albuterol (ventolin HFA) 108 (90 base) MCG/ACT inhaler 1 to 2 puffs every 4 hours as needed for wheezing, shortness of breath   Other PRN Medications -Acetaminophen 650 mg every 6 as needed/mild pain -Maalox 30 mL oral every 4 as needed/digestion -Magnesium hydroxide 30 mL daily as needed/mild constipation --Zofran tablet 4 mg p.o. every 8 hours as as needed nausea --Imodium capsule 2 mg p.o. daily as needed for diarrhea  Safety and Monitoring: Voluntary admission to inpatient psychiatric unit for safety, stabilization and treatment  Daily contact with patient to assess and evaluate symptoms and progress in treatment Patient's case to be discussed in multi-disciplinary team meeting Observation Level : q15 minute  checks Vital signs: q12 hours Precautions: suicide, but pt currently verbally contracts for safety on unit    Discharge Planning: Social work and case management to assist with discharge planning and identification of hospital follow-up needs prior to discharge Estimated LOS: 5-7 days Discharge Concerns: Need to establish a safety plan; Medication compliance and effectiveness Discharge Goals: Return home with outpatient referrals for mental health follow-up including medication management/psychotherapy.   I certify that inpatient services furnished can reasonably be expected to improve the patient's condition.    Inda Merlin, NP 05/07/2022, 9:09 AMPatient ID: Hildred Priest, male   DOB: 1990/08/20, 32 y.o.   MRN: [id number]

## 2022-05-07 NOTE — Group Note (Signed)
LCSW Group Therapy Note  02/23/2019   10:00-11:00am   Type of Therapy and Topic:  Group Therapy: Anger Analysis Using CBT  Participation Level:  Minimal   Description of Group:   In this group, patients learned how to recognize the physical, cognitive, emotional, and behavioral responses they have to anger-provoking situations.  They identified a recent time they became angry and how they reacted.  They identified the thoughts they had at the time they last were angry, and how this influenced their subsequent actions.  The group then explored how Cognitive Behavioral analysis can help change outcomes.  Therapeutic Goals: Patients will remember his/her last incident of anger and the surrounding circumstances Patients will identify how they felt emotionally and physically, what their thoughts were at the time, and what actions they took Patients will explore possible changes in their feelings and actions if their thoughts had been analyzed and actually found to be inaccurate or unhelpful.  Summary of Patient Progress:  The patient shared that his frequent source of anger was lack of common sense.   He shared that he tries to take that energy and do something positive with it such as working to become a better father, cook food, clean, or such.  He then closed his eyes and stopped interacting for the remainder of group.  Therapeutic Modalities:   Cognitive Behavioral Therapy  Maretta Los, MSW, [place]

## 2022-05-07 NOTE — Progress Notes (Signed)
Patient observed in room. Compliant with medications. Denies any pain/discomfort. Patient stated he slept much better last night and verbalizes no further concerns at this time.     05/07/22 0730  Psych Admission Type (Psych Patients Only)  Admission Status Voluntary  Psychosocial Assessment  Patient Complaints None  Eye Contact Fair  Facial Expression Flat  Affect Appropriate to circumstance  Speech Logical/coherent  Interaction Assertive  Motor Activity Slow  Appearance/Hygiene Unremarkable  Behavior Characteristics Cooperative;Appropriate to situation  Mood Pleasant  Thought Process  Coherency WDL  Content WDL  Delusions None reported or observed  Perception WDL  Hallucination None reported or observed  Judgment Limited  Confusion None  Danger to Self  Current suicidal ideation? Denies  Self-Injurious Behavior No self-injurious ideation or behavior indicators observed or expressed   Agreement Not to Harm Self Yes  Description of Agreement verbally contracts for safety  Danger to Others  Danger to Others None reported or observed

## 2022-05-07 NOTE — [facility] Group Notes (Signed)
.  Psychoeducational Group Note    Date:  05/07/2022 Time:1300-1400    Purpose of Group: . The group focus' on teaching patients on how to identify their needs and their Life Skills:  A group where two lists are made. What people need and what are things that we do that are unhealthy. The lists are developed by the patients and it is explained that we often do the actions that are not healthy to get our list of needs met.  Goal:: to develop the coping skills needed to get their needs met  Participation Level:  did not attend  Glennys Schorsch A  

## 2022-05-07 NOTE — [facility] Group Notes (Signed)
Goals Group 05/07/22   Group Focus: affirmation, clarity of thought, and goals/reality orientation Treatment Modality:  Psychoeducation Interventions utilized were assignment, group exercise, and support Purpose: To be able to understand and verbalize the reason for their admission to the hospital. To understand that the medication helps with their chemical imbalance but they also need to work on their choices in life. To be challenged to develop a list of 30 positives about themselves. Also introduce the concept that "feelings" are not reality.  Participation Level: did ot attend  Raymond Church

## 2022-05-07 NOTE — Progress Notes (Signed)
   05/07/22 0557  15 Minute Checks  Location Bedroom  Visual Appearance Calm  Behavior Sleeping  Sleep (Behavioral Health Patients Only)  Calculate sleep? (Click Yes once per 24 hr at 0600 safety check) Yes  Documented sleep last 24 hours 9.5

## 2022-05-08 LAB — RESP PANEL BY RT-PCR (RSV, FLU A&B, COVID)  RVPGX2
Influenza A by PCR: NEGATIVE
Influenza B by PCR: NEGATIVE
Resp Syncytial Virus by PCR: NEGATIVE
SARS Coronavirus 2 by RT PCR: NEGATIVE

## 2022-05-08 NOTE — Progress Notes (Signed)
Patient reports to the nursing station c/o "I think I got food poision, I ate the vegetable ziti." Patient reports that he had an episode of vomiting but states that he is no longer nauseous, his stomach hurts right now. Patient given ice chips. Patient refused any medication at this time. Denies dizziness, shortness of breath, general weakness and malaise. Patient states, "I am just going to eat on this ice and try to lay down." Patient made aware that this RN was going to notify the provider and they will follow up with him later this morning, however if the symptoms continue then he will need to take something to ease/cease his symptoms. Patient verbalized understanding.

## 2022-05-08 NOTE — Plan of Care (Signed)
  Problem: Activity: Goal: Interest or engagement in activities will improve Outcome: Progressing   Problem: Health Behavior/Discharge Planning: Goal: Compliance with treatment plan for underlying cause of condition will improve Outcome: Progressing   Problem: Safety: Goal: Periods of time without injury will increase Outcome: Progressing   

## 2022-05-08 NOTE — Progress Notes (Signed)
   05/08/22 0545  15 Minute Checks  Location Bathroom/Shower  Visual Appearance Calm  Behavior Composed;Agitated  Sleep (Behavioral Health Patients Only)  Calculate sleep? (Click Yes once per 24 hr at 0600 safety check) Yes  Documented sleep last 24 hours 7.25

## 2022-05-08 NOTE — Progress Notes (Addendum)
Patient observed laying in bed this morning reporting an upset stomach, nausea/voming x2 last night. Patient stated he could not even eat breakfast this morning. Patient given gatorade for hydration. Patient stated his depression and anxiety is a 7 out of 10. Patient med compliant and denies SI,HI, and A/V/H with no plan/intent. By lunch time patient observed being able to tolerate food and reported feeling better. Patient's vs remain stable with the exception of slightly elevated HR. No s/s of current distress.      05/08/22 0749  Psych Admission Type (Psych Patients Only)  Admission Status Voluntary  Psychosocial Assessment  Patient Complaints Malaise  Eye Contact Fair  Facial Expression Flat  Affect Appropriate to circumstance  Speech Logical/coherent  Interaction Assertive  Motor Activity Slow  Appearance/Hygiene Unremarkable  Behavior Characteristics Cooperative;Appropriate to situation  Mood Pleasant  Thought Process  Coherency WDL  Content WDL  Delusions None reported or observed  Perception WDL  Hallucination None reported or observed  Judgment Limited  Confusion None  Danger to Self  Current suicidal ideation? Denies  Self-Injurious Behavior No self-injurious ideation or behavior indicators observed or expressed   Agreement Not to Harm Self Yes  Description of Agreement verbally contracts for safety  Danger to Others  Danger to Others None reported or observed

## 2022-05-08 NOTE — Plan of Care (Signed)
  Problem: Education: Goal: Emotional status will improve Outcome: Progressing Goal: Mental status will improve Outcome: Progressing   Problem: Activity: Goal: Interest or engagement in activities will improve Outcome: Progressing   Problem: Coping: Goal: Ability to demonstrate self-control will improve Outcome: Progressing   

## 2022-05-08 NOTE — Progress Notes (Signed)
[facility] MD Progress Note  05/08/2022 9:37 AM Raymond Church  MRN:  [id number]   HPI: Raymond Church is a 32 y.o. African American male with past history of asthma, depression, polysubstance use, and bipolar disorder who initially presented to [facility] with suicidal ideations with plan to step out in traffic; he was then admitted to [place] for further stabilization and treatment. Patient reports multiple family deaths, recent break up resulting in the removal of his kids over the past month as factors contributing to his mental decline. Patient notes previously being on psychotropic medications in which he discontinued several years ago for no specific reason. Per chart review patient has history of multiple ED presentations within [facility] system for bipolar 1 disorder, suicidal ideations, dystonic drug reaction, aggressive behavior, anxiety dating back to 2015; previously admitted to [address] hall 05/04/2016.   24 hour chart review: Patient's vital signs  improving with blood pressure 112/69, pulse rate 98; Prazosin discontinued 05/06/21. He continues to attend meals; noted to increase his PO intake. Medication compliant; PRN Albuterol, Maalox, Zofran Trazodone. Remains visible in milieu participating for group activities. Plan for family meeting canceled Tuesday and is now scheduled for discharge Wednesday 05/11/21 to return home to [place] (confirmed by SW). Slept 7.25 hours.   Today's assessment: Patient observed laying in bed. Presents disheveled. Ill appearing. Calm and cooperative. Consistent eye contact. Dysphic mood; congruent affect. Withdrawn and guarded.  He reports not feeling well. Endorses nausea, vomiting, with some diarrhea and body aches. Reports ability to tolerate some oral intake with PRN medications; snacks and fluids visible at the bedside. SARS testing pending. PRN medications given. He denies any more nightmares endorses  removing Nicotine during the night. He reports consistent contact with  godmother with no noted issues. He denies any active SI/HI/AVH; contracts for safety. Patient did not appear to be responding to internal or external stimuli during encounter. No paranoia or delusional thinking observed during assessment. He remains medications compliant and denies any side effects. No medication changes noted for this time. Based on physical symptoms of patient and several others on unit; patient is currently to remain in his room until testing completed in which he has verbalized an understanding.   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   Current/prior outpatient treatment: Patient was treated at [facility] for polysubstance treatment   Past Medical History:  Past Medical History:  Diagnosis Date   Asthma    Bipolar disorder ([place])    Depression    Pneumonia     Past Surgical History:  Procedure Laterality Date   NO PAST SURGERIES     Family History:  Family History  Problem Relation Age of Onset   Bipolar disorder Mother    Drug abuse Mother    Family Psychiatric  History: Psych: Mom had bipolar disorder and depression; Dad: PTSD, [place] veteran  Psych Rx: Patient does not remember treatment SA/HA: Mom had suicide  attempts and homicidal attempt Substance use family hx: Patient and mom was using crack cocaine.  Mom died in 09/01/2003  Social History:  Social History   Substance and Sexual Activity  Alcohol Use Never   Comment: denies     Social History   Substance and Sexual Activity  Drug Use Yes   Types: Marijuana, Methamphetamines   Comment: currently using    Social History   Socioeconomic History   Marital status: Single    Spouse name: Not on file   Number of children: Not on file   Years of education: Not on file   Highest education level: Not on file  Occupational History   Not on file  Tobacco Use   Smoking status: Every Day    Packs/day: 0.25    Years: 10.00    Total pack years: 2.50    Types: Cigarettes   Smokeless tobacco: Never   Tobacco comments:    refused  Vaping Use   Vaping Use: Never used  Substance and Sexual Activity   Alcohol use: Never    Comment: denies   Drug use: Yes    Types: Marijuana, Methamphetamines    Comment: currently using   Sexual activity: Yes  Other Topics Concern   Not on file  Social History Narrative   Not on file   Social Determinants of Health   Financial Resource Strain: Not on file  Food Insecurity: No Food Insecurity (05/02/2022)   Hunger Vital Sign    Worried About Running Out of Food in the Last Year: Never true    Ran Out of Food in the Last Year: Never true  Transportation Needs: No Transportation Needs (05/02/2022)   PRAPARE - Hydrologist (Medical): No    Lack of Transportation (Non-Medical): No  Physical Activity: Not on file  Stress: Not on file  Social Connections: Not on file   Additional Social History:    Sleep: Fair  Appetite:  Good  Current Medications: Current Facility-Administered Medications  Medication Dose Route Frequency Provider Last Rate Last Admin   acetaminophen (TYLENOL) tablet 650 mg  650 mg Oral Q6H PRN [name], [name], MD       albuterol (VENTOLIN HFA) 108 (90  Base) MCG/ACT inhaler 1-2 puff  1-2 puff Inhalation Q4H PRN [name], NP   2 puff at 05/07/22 1512   alum & mag hydroxide-simeth (MAALOX/MYLANTA) 200-200-20 MG/5ML suspension 30 mL  30 mL Oral Q6H PRN [name], NP   30 mL at 05/08/22 0158   haloperidol (HALDOL) tablet 5 mg  5 mg Oral TID PRN [name], MD       And   LORazepam (ATIVAN) tablet 2 mg  2 mg Oral TID PRN [name], MD       And   diphenhydrAMINE (BENADRYL) capsule 50 mg  50 mg Oral TID PRN [name], [name], MD       haloperidol lactate (HALDOL) injection 5 mg  5 mg Intramuscular TID PRN [name], [name], MD       And   LORazepam (ATIVAN) injection 2 mg  2 mg Intramuscular TID PRN [name], [name], MD       And   diphenhydrAMINE (BENADRYL) injection 50 mg  50 mg Intramuscular TID PRN [name], [name], MD       feeding supplement (ENSURE ENLIVE / ENSURE PLUS) liquid 237 mL  237 mL Oral BID BM [name], MD  237 mL at 05/07/22 1435   hydrOXYzine (ATARAX) tablet 25 mg  25 mg Oral TID PRN Laretta Bolster, [name]   25 mg at 05/07/22 2042   ibuprofen (ADVIL) tablet 600 mg  600 mg Oral Q8H PRN [name], NP   600 mg at 05/07/22 1404   loperamide (IMODIUM) capsule 2 mg  2 mg Oral PRN [name], [name], [name]       lurasidone (LATUDA) tablet 40 mg  40 mg Oral Q supper [name], [name], [name]   40 mg at 05/07/22 1728   magnesium hydroxide (MILK OF MAGNESIA) suspension 30 mL  30 mL Oral Daily PRN [name], NP       melatonin tablet 3 mg  3 mg Oral QHS [name], Nathan, MD   3 mg at 05/07/22 2043   nicotine (NICODERM CQ - dosed in mg/24 hours) patch 21 mg  21 mg Transdermal Daily [name], NP   21 mg at 05/07/22 1846   ondansetron (ZOFRAN) tablet 4 mg  4 mg Oral Q8H PRN [name], NP   4 mg at 05/08/22 0158   traZODone (DESYREL) tablet 50 mg  50 mg Oral QHS PRN [name], MD   50 mg at 05/07/22 2042    Lab Results:  No results found for this or any previous visit  (from the past 48 hour(s)).   Blood Alcohol level:  Lab Results  Component Value Date   ETH <10 05/01/2022   ETH <5 99991111    Metabolic Disorder Labs: Lab Results  Component Value Date   HGBA1C 5.2 05/06/2016   MPG 103 05/06/2016   Lab Results  Component Value Date   PROLACTIN 22.5 (H) 05/06/2016   Lab Results  Component Value Date   CHOL 125 05/06/2016   TRIG 180 (H) 05/06/2016   HDL 42 05/06/2016   CHOLHDL 3.0 05/06/2016   VLDL 36 05/06/2016   LDLCALC 47 05/06/2016    Physical Findings: AIMS:  , ,  ,  ,    CIWA:    COWS:     Musculoskeletal: Strength & Muscle Tone: within normal limits Gait & Station: normal Patient leans: [name]  Psychiatric Specialty Exam:  Presentation  General Appearance:  Casual; Disheveled  Eye Contact: Good  Speech: Clear and Coherent  Speech Volume: Normal  Handedness: Right  Mood and Affect  Mood: Dysphoric  Affect: Congruent  Thought Process  Thought Processes: Coherent  Descriptions of Associations:Intact  Orientation:Full (Time, Place and Person)  Thought Content:Logical  History of Schizophrenia/Schizoaffective disorder:No data recorded Duration of Psychotic Symptoms:No data recorded Hallucinations:Hallucinations: None  Ideas of Reference:None  Suicidal Thoughts:Suicidal Thoughts: No  Homicidal Thoughts:  Sensorium  Memory: Immediate Fair; Recent Fair  Judgment: Fair  Insight: Fair  Executive Functions  Concentration: Good  Attention Span: Good  Recall: Good  Fund of Knowledge: Good  Language: Good  Psychomotor Activity  Psychomotor Activity: Psychomotor Activity: Normal  Assets  Assets: Communication Skills; Physical Health; Social Support; Resilience  Sleep  Sleep: Sleep: Fair  Physical Exam: Physical Exam Vitals and nursing note reviewed.  HENT:     Head: Normocephalic.     Nose: Nose normal.     Mouth/Throat:     Mouth: Mucous membranes are moist.      Pharynx: Oropharynx is clear.  Eyes:     Conjunctiva/sclera: Conjunctivae normal.     Pupils: Pupils are equal, round, and reactive to light.  Cardiovascular:     Rate and Rhythm: Tachycardia present.  Pulses: Normal pulses.     Comments: Blood pressure 98/53, pulse 117.  Nursing staff to recheck vital signs Pulmonary:     Effort: Pulmonary effort is normal.  Abdominal:     Palpations: Abdomen is soft.  Genitourinary:    Comments: Deferred Musculoskeletal:        General: Normal range of motion.     Cervical back: Normal range of motion.  Skin:    General: Skin is warm.  Neurological:     General: No focal deficit present.     Mental Status: He is alert and oriented to person, place, and time.  Psychiatric:        Mood and Affect: Mood normal.        Behavior: Behavior normal.    Review of Systems  Constitutional: Negative.   HENT: Negative.    Eyes: Negative.   Respiratory: Negative.    Cardiovascular: Negative.        Blood pressure 98/53, pulse 117.  Nursing staff to recheck vital signs  Gastrointestinal: Negative.   Genitourinary: Negative.   Musculoskeletal: Negative.   Skin: Negative.   Neurological: Negative.   Endo/Heme/Allergies: Negative.   Psychiatric/Behavioral:  Positive for depression and substance abuse.    Blood pressure 112/69, pulse 98, temperature 98.3 F (36.8 C), temperature source Oral, resp. rate 16, height 5\' 9"  (1.753 m), weight 63 kg, SpO2 100 %. Body mass index is 20.53 kg/m.  Treatment Plan Summary: Daily contact with patient to assess and evaluate symptoms and progress in treatment and Medication management  Observation Level/Precautions:  15 minute checks  Laboratory:  CBC Chemistry Profile HbAIC UDS UA  Psychotherapy: Therapeutic milieu  Medications: See MAR  Consultations: Pending  Discharge Concerns: Compliance with medication  Estimated LOS: 5 to 7 days  Other:      Physician Treatment Plan for Primary Diagnosis:  Suicidal ideation Long Term Goal(s): Improvement in symptoms so as ready for discharge   Short Term Goals: Ability to identify changes in lifestyle to reduce recurrence of condition will improve, Ability to verbalize feelings will improve, Ability to disclose and discuss suicidal ideas, Ability to demonstrate self-control will improve, Ability to identify and develop effective coping behaviors will improve, Ability to maintain clinical measurements within normal limits will improve, Compliance with prescribed medications will improve, and Ability to identify triggers associated with substance abuse/mental health issues will improve   Physician Treatment Plan for Secondary Diagnosis: Principal Problem:   Suicidal ideation Bipolar disorder Depression Insomnia Agitation PTSD   Bipolar disorder: -- Initiate lurasidone Latuda 40 mg tablets p.o. every evening daily   PTSD: -- Initiate prazosin 1 mg tablets p.o. daily nightly   Insomnia: -- Initiate trazodone 50 mg p.o. nightly as needed -- Initiate melatonin tablets 5 mg p.o. at bedtime   Smoking cessation: -- Initiate nicotine (NicoDerm CQ-dose in mg/24 hours) patch 21 mg transdermal over 24 hours.   Agitation: -- Initiate ziprasidone Geodon IM 20 mg 2 times daily as needed -- Hydroxyzine 50 mg p.o. 3 times daily as needed (Education provided on rationales, benefits and possible side effects of psychotropic medication which may include weight gain and metabolic syndrome.  Patient verbalizes understanding.)   Asthma: -- Resume albuterol (ventolin HFA) 108 (90 base) MCG/ACT inhaler 1 to 2 puffs every 4 hours as needed for wheezing, shortness of breath   Other PRN Medications -Acetaminophen 650 mg every 6 as needed/mild pain -Maalox 30 mL oral every 4 as needed/digestion -Magnesium hydroxide 30 mL daily as needed/mild constipation --  Zofran tablet 4 mg p.o. every 8 hours as as needed nausea --Imodium capsule 2 mg p.o. daily as needed  for diarrhea  Safety and Monitoring: Voluntary admission to inpatient psychiatric unit for safety, stabilization and treatment Daily contact with patient to assess and evaluate symptoms and progress in treatment Patient's case to be discussed in multi-disciplinary team meeting Observation Level : q15 minute checks Vital signs: q12 hours Precautions: suicide, but pt currently verbally contracts for safety on unit    Discharge Planning: Social work and case management to assist with discharge planning and identification of hospital follow-up needs prior to discharge Estimated LOS: 5-7 days Discharge Concerns: Need to establish a safety plan; Medication compliance and effectiveness Discharge Goals: Return home with outpatient referrals for mental health follow-up including medication management/psychotherapy.   I certify that inpatient services furnished can reasonably be expected to improve the patient's condition.    Loletta Parish, NP 05/08/2022, 9:37 AMPatient ID: Corrie Dandy, male   DOB: 01-Sep-1990, 32 y.o.   MRN: [id number]

## 2022-05-08 NOTE — Progress Notes (Signed)
Patient reports to nursing station c/o nausea, stomach cramping, and heartburn. Patient educated about PRN medication. Patient denies vomiting, constipation, or diarrhea. Patient is to be administered PRN medication for nausea, Zofran, and PRN medication for acid reflux, Maalox.

## 2022-05-08 NOTE — Progress Notes (Signed)
    05/08/22 2126  Psych Admission Type (Psych Patients Only)  Admission Status Voluntary  Psychosocial Assessment  Patient Complaints None  Eye Contact Fair  Facial Expression Flat  Affect Appropriate to circumstance  Speech Logical/coherent  Interaction Assertive  Motor Activity Slow  Appearance/Hygiene Unremarkable  Behavior Characteristics Cooperative;Appropriate to situation  Mood Pleasant  Thought Process  Coherency WDL  Content WDL  Delusions None reported or observed  Perception WDL  Hallucination None reported or observed  Judgment Limited  Confusion None  Danger to Self  Current suicidal ideation? Denies  Self-Injurious Behavior No self-injurious ideation or behavior indicators observed or expressed   Agreement Not to Harm Self Yes  Description of Agreement  (verbal)  Danger to Others  Danger to Others None reported or observed

## 2022-05-09 ENCOUNTER — Encounter (HOSPITAL_COMMUNITY): Payer: Self-pay

## 2022-05-09 NOTE — Progress Notes (Signed)
[facility] MD Progress Note  05/09/2022 3:35 PM Raymond Church  MRN:  [id number]   HPI: Raymond Church is a 32 y.o. African American male with past history of asthma, depression, polysubstance use, and bipolar disorder who initially presented to [facility] with suicidal ideations with plan to step out in traffic; he was then admitted to [facility] for further stabilization and treatment. Patient reports multiple family deaths, recent break up resulting in the removal of his kids over the past month as factors contributing to his mental decline. Patient notes previously being on psychotropic medications in which he discontinued several years ago for no specific reason. Per chart review patient has history of multiple ED presentations within [facility] system for bipolar 1 disorder, suicidal ideations, dystonic drug reaction, aggressive behavior, anxiety dating back to 2015; previously admitted to [address] 05/04/2016.   24 hour chart review: Vital signs with blood pressure 114/71, pulse rate 106.  Nursing staff to recheck vital signs.  He continues to attend meals at the cafeteria; and encourage increased p.o. fluid intake. Medication compliant; PRN of Maalox, Zofran, hydroxyzine, and trazodone requested for mild constipation, nausea, anxiety and sleep. Remains visible in milieu participating for group activities. Plan for family meeting canceled Tuesday and is now scheduled for discharge Wednesday 05/11/21 to return home to godmother (confirmed by SW).  Patient reports sleeping well over 10 hours last night.  Today's assessment: Patient is seen and examined lying on his bed on [address]. Calm and cooperative with the exam.  Maintains consistent eye contact during the encounter. Mood is euthymic and affect congruent.  Patient reports, "I feel great today, I woke up with an open mind."  SARS coronavirus 2 testing negative.  He denies any more nightmares. He reports consistent contact with godmother with no voiced conflict. He  denies any active SI/HI/AVH; contracts for safety. Patient did not appear to be responding to internal or external stimuli during encounter. No paranoia or delusional thinking observed during assessment. He remains medications compliant and denies any side effects. No medication changes noted for this time.  Patient seen socializing with other patient in the unit and attending therapeutic milieu and group activities.   Principal Problem: Bipolar disorder, curr episode mixed, severe, with psychotic features (HCC)  Diagnosis: Principal Problem:   Bipolar disorder, curr episode mixed, severe, with psychotic features (HCC) Active Problems:   Cocaine use disorder, severe, dependence (HCC)   Cannabis use disorder, severe, dependence (HCC)   Polysubstance abuse (HCC)   MDMA abuse (HCC)   Suicidal ideation  Total Time spent with patient: 45 minutes  Past Psychiatric History: Previous Psych Diagnoses: Suicidal ideation, bipolar disorder current episode mixed with severe psychotic features, cannabis use disorder severe, cocaine use disorder severe dependence, MDMA abuse, polysubstance use and abuse, with Prior inpatient treatment: Patient has been admitted twice to behavioral health Hospital in 2017 and 2018.   Current/prior outpatient treatment: Patient was treated at [facility] for polysubstance treatment   Past Medical History:  Past Medical History:  Diagnosis Date   Asthma    Bipolar disorder (HCC)    Depression    Pneumonia     Past Surgical History:  Procedure Laterality Date   NO PAST SURGERIES     Family History:  Family History  Problem Relation Age of Onset   Bipolar disorder Mother    Drug abuse Mother    Family Psychiatric  History: Psych: Mom had bipolar disorder and depression; Dad: PTSD, [name] Psych Rx: Patient does not  remember treatment SA/HA: Mom had suicide attempts and homicidal attempt Substance use family hx: Patient and mom was using crack cocaine.  Mom  died in 08/18/2003  Social History:  Social History   Substance and Sexual Activity  Alcohol Use Never   Comment: denies     Social History   Substance and Sexual Activity  Drug Use Yes   Types: Marijuana, Methamphetamines   Comment: currently using    Social History   Socioeconomic History   Marital status: Single    Spouse name: Not on file   Number of children: Not on file   Years of education: Not on file   Highest education level: Not on file  Occupational History   Not on file  Tobacco Use   Smoking status: Every Day    Packs/day: 0.25    Years: 10.00    Total pack years: 2.50    Types: Cigarettes   Smokeless tobacco: Never   Tobacco comments:    refused  Vaping Use   Vaping Use: Never used  Substance and Sexual Activity   Alcohol use: Never    Comment: denies   Drug use: Yes    Types: Marijuana, Methamphetamines    Comment: currently using   Sexual activity: Yes  Other Topics Concern   Not on file  Social History Narrative   Not on file   Social Determinants of Health   Financial Resource Strain: Not on file  Food Insecurity: No Food Insecurity (05/02/2022)   Hunger Vital Sign    Worried About Running Out of Food in the Last Year: Never true    Ran Out of Food in the Last Year: Never true  Transportation Needs: No Transportation Needs (05/02/2022)   PRAPARE - Administrator, Civil Service (Medical): No    Lack of Transportation (Non-Medical): No  Physical Activity: Not on file  Stress: Not on file  Social Connections: Not on file   Additional Social History:    Sleep: Fair  Appetite:  Good  Current Medications: Current Facility-Administered Medications  Medication Dose Route Frequency Provider Last Rate Last Admin   acetaminophen (TYLENOL) tablet 650 mg  650 mg Oral Q6H PRN [name], Harrold Donath, MD       albuterol (VENTOLIN HFA) 108 (90 Base) MCG/ACT inhaler 1-2 puff  1-2 puff Inhalation Q4H PRN Eligha Bridegroom, NP   2 puff at 05/07/22  1512   alum & mag hydroxide-simeth (MAALOX/MYLANTA) 200-200-20 MG/5ML suspension 30 mL  30 mL Oral Q6H PRN Eligha Bridegroom, NP   30 mL at 05/08/22 0158   haloperidol (HALDOL) tablet 5 mg  5 mg Oral TID PRN Phineas Inches, MD       And   LORazepam (ATIVAN) tablet 2 mg  2 mg Oral TID PRN Phineas Inches, MD       And   diphenhydrAMINE (BENADRYL) capsule 50 mg  50 mg Oral TID PRN [name], Harrold Donath, MD       haloperidol lactate (HALDOL) injection 5 mg  5 mg Intramuscular TID PRN [name], Harrold Donath, MD       And   LORazepam (ATIVAN) injection 2 mg  2 mg Intramuscular TID PRN [name], Harrold Donath, MD       And   diphenhydrAMINE (BENADRYL) injection 50 mg  50 mg Intramuscular TID PRN [name], Harrold Donath, MD       feeding supplement (ENSURE ENLIVE / ENSURE PLUS) liquid 237 mL  237 mL Oral BID BM [name], Harrold Donath, MD   237 mL at  05/09/22 1452   hydrOXYzine (ATARAX) tablet 25 mg  25 mg Oral TID PRN Cecilie Lowers, [name]   25 mg at 05/08/22 2126   ibuprofen (ADVIL) tablet 600 mg  600 mg Oral Q8H PRN Eligha Bridegroom, NP   600 mg at 05/07/22 1404   loperamide (IMODIUM) capsule 2 mg  2 mg Oral PRN Isaiyah Feldhaus, Jesusita Oka, [name]       lurasidone (LATUDA) tablet 40 mg  40 mg Oral Q supper Jakaylee Sasaki C, [name]   40 mg at 05/08/22 1702   magnesium hydroxide (MILK OF MAGNESIA) suspension 30 mL  30 mL Oral Daily PRN Eligha Bridegroom, NP       melatonin tablet 3 mg  3 mg Oral QHS [name], Nathan, MD   3 mg at 05/08/22 2126   nicotine (NICODERM CQ - dosed in mg/24 hours) patch 21 mg  21 mg Transdermal Daily Eligha Bridegroom, NP   21 mg at 05/07/22 1846   ondansetron (ZOFRAN) tablet 4 mg  4 mg Oral Q8H PRN Eligha Bridegroom, NP   4 mg at 05/08/22 0158   traZODone (DESYREL) tablet 50 mg  50 mg Oral QHS PRN [name], Harrold Donath, MD   50 mg at 05/08/22 2126    Lab Results:  Results for orders placed or performed during the hospital encounter of 05/02/22 (from the past 48 hour(s))  Resp panel by RT-PCR (RSV, Flu A&B, Covid)  Anterior Nasal Swab     Status: None   Collection Time: 05/08/22 10:17 AM   Specimen: Anterior Nasal Swab  Result Value Ref Range   SARS Coronavirus 2 by RT PCR NEGATIVE NEGATIVE    Comment: (NOTE) SARS-CoV-2 target nucleic acids are NOT DETECTED.  The SARS-CoV-2 RNA is generally detectable in upper respiratory specimens during the acute phase of infection. The lowest concentration of SARS-CoV-2 viral copies this assay can detect is 138 copies/mL. A negative result does not preclude SARS-Cov-2 infection and should not be used as the sole basis for treatment or other patient management decisions. A negative result may occur with  improper specimen collection/handling, submission of specimen other than nasopharyngeal swab, presence of viral mutation(s) within the areas targeted by this assay, and inadequate number of viral copies(<138 copies/mL). A negative result must be combined with clinical observations, patient history, and epidemiological information. The expected result is Negative.  Fact Sheet for Patients:  BloggerCourse.com  Fact Sheet for Healthcare Providers:  SeriousBroker.it  This test is no t yet approved or cleared by the [place] FDA and  has been authorized for detection and/or diagnosis of SARS-CoV-2 by FDA under an Emergency Use Authorization (EUA). This EUA will remain  in effect (meaning this test can be used) for the duration of the COVID-19 declaration under Section 564(b)(1) of the Act, 21 U.S.C.section 360bbb-3(b)(1), unless the authorization is terminated  or revoked sooner.       Influenza A by PCR NEGATIVE NEGATIVE   Influenza B by PCR NEGATIVE NEGATIVE    Comment: (NOTE) The Xpert Xpress SARS-CoV-2/FLU/RSV plus assay is intended as an aid in the diagnosis of influenza from Nasopharyngeal swab specimens and should not be used as a sole basis for treatment. Nasal washings and aspirates are  unacceptable for Xpert Xpress SARS-CoV-2/FLU/RSV testing.  Fact Sheet for Patients: BloggerCourse.com  Fact Sheet for Healthcare Providers: SeriousBroker.it  This test is not yet approved or cleared by the [place] FDA and has been authorized for detection and/or diagnosis of SARS-CoV-2 by FDA under an Emergency Use  Authorization (EUA). This EUA will remain in effect (meaning this test can be used) for the duration of the COVID-19 declaration under Section 564(b)(1) of the Act, 21 U.S.C. section 360bbb-3(b)(1), unless the authorization is terminated or revoked.     Resp Syncytial Virus by PCR NEGATIVE NEGATIVE    Comment: (NOTE) Fact Sheet for Patients: EntrepreneurPulse.com.au  Fact Sheet for Healthcare Providers: IncredibleEmployment.be  This test is not yet approved or cleared by the [place] FDA and has been authorized for detection and/or diagnosis of SARS-CoV-2 by FDA under an Emergency Use Authorization (EUA). This EUA will remain in effect (meaning this test can be used) for the duration of the COVID-19 declaration under Section 564(b)(1) of the Act, 21 U.S.C. section 360bbb-3(b)(1), unless the authorization is terminated or revoked.  Performed at [facility], [place] [address], [place], [place] [address]      Blood Alcohol level:  Lab Results  Component Value Date   [facility] <10 05/01/2022   ETH <5 37/85/8850    Metabolic Disorder Labs: Lab Results  Component Value Date   HGBA1C 5.2 05/06/2016   MPG 103 05/06/2016   Lab Results  Component Value Date   PROLACTIN 22.5 (H) 05/06/2016   Lab Results  Component Value Date   CHOL 125 05/06/2016   TRIG 180 (H) 05/06/2016   HDL 42 05/06/2016   CHOLHDL 3.0 05/06/2016   VLDL 36 05/06/2016   LDLCALC 47 05/06/2016    Physical Findings: AIMS:  , ,  ,  ,    CIWA:    COWS:      Musculoskeletal: Strength & Muscle Tone: within normal limits Gait & Station: normal Patient leans: [name]  Psychiatric Specialty Exam:  Presentation  General Appearance:  Appropriate for Environment; Casual; Fairly Groomed  Eye Contact: Good  Speech: Clear and Coherent; Normal Rate  Speech Volume: Normal  Handedness: Right  Mood and Affect  Mood: Euthymic  Affect: Congruent  Thought Process  Thought Processes: Coherent  Descriptions of Associations:Intact  Orientation:Full (Time, Place and Person)  Thought Content:Logical  History of Schizophrenia/Schizoaffective disorder:No data recorded Duration of Psychotic Symptoms:No data recorded Hallucinations:Hallucinations: None Description of Auditory Hallucinations: Denies  Ideas of Reference:None  Suicidal Thoughts:Suicidal Thoughts: No SI Active Intent and/or Plan: -- (Denies) SI Passive Intent and/or Plan: -- (Denies)  Homicidal Thoughts:  Sensorium  Memory: Immediate Good; Recent Good  Judgment: Fair  Insight: Fair  Executive Functions  Concentration: Good  Attention Span: Good  Recall: Hominy of Knowledge: Fair  Language: Good  Psychomotor Activity  Psychomotor Activity: Psychomotor Activity: Normal  Assets  Assets: Communication Skills; Desire for Improvement; Physical Health; Social Support  Sleep  Sleep: Sleep: Good Number of Hours of Sleep: 10  Physical Exam: Physical Exam Vitals and nursing note reviewed.  HENT:     Head: Normocephalic.     Nose: Nose normal.     Mouth/Throat:     Mouth: Mucous membranes are moist.     Pharynx: Oropharynx is clear.  Eyes:     Conjunctiva/sclera: Conjunctivae normal.     Pupils: Pupils are equal, round, and reactive to light.  Cardiovascular:     Rate and Rhythm: Tachycardia present.     Pulses: Normal pulses.     Comments: Blood pressure 98/53, pulse 117.  Nursing staff to recheck vital signs Pulmonary:     Effort:  Pulmonary effort is normal.  Abdominal:     Palpations: Abdomen is soft.  Genitourinary:    Comments: Deferred  Musculoskeletal:        General: Normal range of motion.     Cervical back: Normal range of motion.  Skin:    General: Skin is warm.  Neurological:     General: No focal deficit present.     Mental Status: He is alert and oriented to person, place, and time.  Psychiatric:        Mood and Affect: Mood normal.        Behavior: Behavior normal.    Review of Systems  Constitutional: Negative.   HENT: Negative.    Eyes: Negative.   Respiratory: Negative.    Cardiovascular: Negative.        Blood pressure 98/53, pulse 117.  Nursing staff to recheck vital signs  Gastrointestinal: Negative.   Genitourinary: Negative.   Musculoskeletal: Negative.   Skin: Negative.   Neurological: Negative.   Endo/Heme/Allergies: Negative.   Psychiatric/Behavioral:  Positive for depression and substance abuse.    Blood pressure 114/71, pulse (!) 106, temperature 98.7 F (37.1 C), temperature source Oral, resp. rate 16, height 5\' 9"  (1.753 m), weight 63 kg, SpO2 98 %. Body mass index is 20.53 kg/m.  Treatment Plan Summary: Daily contact with patient to assess and evaluate symptoms and progress in treatment and Medication management  Observation Level/Precautions:  15 minute checks  Laboratory:  CBC Chemistry Profile HbAIC UDS UA  Psychotherapy: Therapeutic milieu  Medications: See MAR  Consultations: Pending  Discharge Concerns: Compliance with medication  Estimated LOS: 5 to 7 days  Other:      Physician Treatment Plan for Primary Diagnosis: Suicidal ideation Long Term Goal(s): Improvement in symptoms so as ready for discharge   Short Term Goals: Ability to identify changes in lifestyle to reduce recurrence of condition will improve, Ability to verbalize feelings will improve, Ability to disclose and discuss suicidal ideas, Ability to demonstrate self-control will improve,  Ability to identify and develop effective coping behaviors will improve, Ability to maintain clinical measurements within normal limits will improve, Compliance with prescribed medications will improve, and Ability to identify triggers associated with substance abuse/mental health issues will improve   Physician Treatment Plan for Secondary Diagnosis: Principal Problem:   Suicidal ideation Bipolar disorder Depression Insomnia Agitation PTSD   Bipolar disorder: -- Initiate lurasidone Latuda 40 mg tablets p.o. every evening daily   PTSD: -- Initiate prazosin 1 mg tablets p.o. daily nightly.  This was discontinued on 05/08/2022   Insomnia: -- Initiate trazodone 50 mg p.o. nightly as needed -- Initiate melatonin tablets 5 mg p.o. at bedtime   Smoking cessation: -- Initiate nicotine (NicoDerm CQ-dose in mg/24 hours) patch 21 mg transdermal over 24 hours.   Agitation: -- Initiate ziprasidone Geodon IM 20 mg 2 times daily as needed -- Hydroxyzine 50 mg p.o. 3 times daily as needed (Education provided on rationales, benefits and possible side effects of psychotropic medication which may include weight gain and metabolic syndrome.  Patient verbalizes understanding.)   Asthma: -- Resume albuterol (ventolin HFA) 108 (90 base) MCG/ACT inhaler 1 to 2 puffs every 4 hours as needed for wheezing, shortness of breath   Other PRN Medications -Acetaminophen 650 mg every 6 as needed/mild pain -Maalox 30 mL oral every 4 as needed/digestion -Magnesium hydroxide 30 mL daily as needed/mild constipation --Zofran tablet 4 mg p.o. every 8 hours as as needed nausea --Imodium capsule 2 mg p.o. daily as needed for diarrhea  Safety and Monitoring: Voluntary admission to inpatient psychiatric unit for safety, stabilization and treatment Daily contact with  patient to assess and evaluate symptoms and progress in treatment Patient's case to be discussed in multi-disciplinary team meeting Observation Level : q15  minute checks Vital signs: q12 hours Precautions: suicide, but pt currently verbally contracts for safety on unit    Discharge Planning: Social work and case management to assist with discharge planning and identification of hospital follow-up needs prior to discharge Estimated LOS: 5-7 days Discharge Concerns: Need to establish a safety plan; Medication compliance and effectiveness Discharge Goals: Return home with outpatient referrals for mental health follow-up including medication management/psychotherapy.   I certify that inpatient services furnished can reasonably be expected to improve the patient's condition.    Cecilie Lowers, [name] 05/09/2022, 3:35 PMPatient ID: Raymond Church, male   DOB: [birth year], 32 y.o.   MRN: [id number] Patient ID: Raymond Church, male   DOB: [birth year], 32 y.o.   MRN: [id number]

## 2022-05-09 NOTE — Progress Notes (Signed)
D) Pt received calm, visible, participating in milieu, and in no acute distress. Pt A & O x4. Pt denies SI, HI, A/ V H, depression, anxiety and pain at this time. A) Pt encouraged to drink fluids. Pt encouraged to come to staff with needs. Pt encouraged to attend and participate in groups. Pt encouraged to set reachable goals.  R) Pt remained safe on unit, in no acute distress, will continue to assess.     05/09/22 2200  Psych Admission Type (Psych Patients Only)  Admission Status Voluntary  Psychosocial Assessment  Patient Complaints None  Eye Contact Fair  Facial Expression Flat  Affect Appropriate to circumstance  Speech Logical/coherent  Interaction Assertive  Motor Activity Slow  Appearance/Hygiene Unremarkable  Behavior Characteristics Cooperative;Appropriate to situation  Mood Pleasant  Thought Process  Coherency WDL  Content WDL  Delusions None reported or observed  Perception WDL  Hallucination None reported or observed  Judgment Limited  Confusion None  Danger to Self  Current suicidal ideation? Denies  Self-Injurious Behavior No self-injurious ideation or behavior indicators observed or expressed   Agreement Not to Harm Self Yes  Description of Agreement verbal  Danger to Others  Danger to Others None reported or observed

## 2022-05-09 NOTE — [facility] Group Notes (Signed)
Pt attended AA group 

## 2022-05-09 NOTE — Progress Notes (Signed)
   05/09/22 0547  15 Minute Checks  Location Bedroom  Visual Appearance Calm  Behavior Sleeping  Sleep (Behavioral Health Patients Only)  Calculate sleep? (Click Yes once per 24 hr at 0600 safety check) Yes  Documented sleep last 24 hours 9.5

## 2022-05-09 NOTE — BH IP Treatment Plan (Signed)
Interdisciplinary Treatment and Diagnostic Plan Update  05/09/2022 Time of Session: 9:40 AM ( UPDATE ) Raymond Church MRN: [id number]  Principal Diagnosis: Bipolar disorder, curr episode mixed, severe, with psychotic features (HCC)  Secondary Diagnoses: Principal Problem:   Bipolar disorder, curr episode mixed, severe, with psychotic features (HCC) Active Problems:   Cocaine use disorder, severe, dependence (HCC)   Cannabis use disorder, severe, dependence (HCC)   Polysubstance abuse (HCC)   MDMA abuse (HCC)   Suicidal ideation   Current Medications:  Current Facility-Administered Medications  Medication Dose Route Frequency Provider Last Rate Last Admin   acetaminophen (TYLENOL) tablet 650 mg  650 mg Oral Q6H PRN [name], Harrold Donath, MD       albuterol (VENTOLIN HFA) 108 (90 Base) MCG/ACT inhaler 1-2 puff  1-2 puff Inhalation Q4H PRN Eligha Bridegroom, NP   2 puff at 05/07/22 1512   alum & mag hydroxide-simeth (MAALOX/MYLANTA) 200-200-20 MG/5ML suspension 30 mL  30 mL Oral Q6H PRN Eligha Bridegroom, NP   30 mL at 05/08/22 0158   haloperidol (HALDOL) tablet 5 mg  5 mg Oral TID PRN Phineas Inches, MD       And   LORazepam (ATIVAN) tablet 2 mg  2 mg Oral TID PRN Phineas Inches, MD       And   diphenhydrAMINE (BENADRYL) capsule 50 mg  50 mg Oral TID PRN [name], Harrold Donath, MD       haloperidol lactate (HALDOL) injection 5 mg  5 mg Intramuscular TID PRN [name], Harrold Donath, MD       And   LORazepam (ATIVAN) injection 2 mg  2 mg Intramuscular TID PRN [name], Harrold Donath, MD       And   diphenhydrAMINE (BENADRYL) injection 50 mg  50 mg Intramuscular TID PRN [name], Harrold Donath, MD       feeding supplement (ENSURE ENLIVE / ENSURE PLUS) liquid 237 mL  237 mL Oral BID BM [name], Harrold Donath, MD   237 mL at 05/09/22 0839   hydrOXYzine (ATARAX) tablet 25 mg  25 mg Oral TID PRN Cecilie Lowers, [name]   25 mg at 05/08/22 2126   ibuprofen (ADVIL) tablet 600 mg  600 mg Oral Q8H PRN Eligha Bridegroom, NP    600 mg at 05/07/22 1404   loperamide (IMODIUM) capsule 2 mg  2 mg Oral PRN [name], Jesusita Oka, [name]       lurasidone (LATUDA) tablet 40 mg  40 mg Oral Q supper [name], [name], [name]   40 mg at 05/08/22 1702   magnesium hydroxide (MILK OF MAGNESIA) suspension 30 mL  30 mL Oral Daily PRN Eligha Bridegroom, NP       melatonin tablet 3 mg  3 mg Oral QHS [name], Nathan, MD   3 mg at 05/08/22 2126   nicotine (NICODERM CQ - dosed in mg/24 hours) patch 21 mg  21 mg Transdermal Daily Eligha Bridegroom, NP   21 mg at 05/07/22 1846   ondansetron (ZOFRAN) tablet 4 mg  4 mg Oral Q8H PRN Eligha Bridegroom, NP   4 mg at 05/08/22 0158   traZODone (DESYREL) tablet 50 mg  50 mg Oral QHS PRN Phineas Inches, MD   50 mg at 05/08/22 2126   PTA Medications: Medications Prior to Admission  Medication Sig Dispense Refill Last Dose   albuterol (PROVENTIL HFA;VENTOLIN HFA) 108 (90 Base) MCG/ACT inhaler Inhale 2 puffs into the lungs every 6 (six) hours as needed for wheezing or shortness of breath. 1 Inhaler 0    carbamazepine (TEGRETOL) 100  MG chewable tablet Chew 1 tablet (100 mg total) by mouth 2 (two) times daily after a meal. (Patient not taking: Reported on 05/01/2022) 60 tablet 0    hydrOXYzine (ATARAX/VISTARIL) 25 MG tablet Take 1 tablet (25 mg total) by mouth every 6 (six) hours as needed (anxiety/agitation). (Patient not taking: Reported on 05/01/2022) 60 tablet 0    lurasidone (LATUDA) 40 MG TABS tablet Take 1 tablet (40 mg total) by mouth daily with supper. (Patient not taking: Reported on 05/01/2022) 30 tablet 0    nicotine polacrilex (NICORETTE) 2 MG gum Take 1 each (2 mg total) by mouth as needed for smoking cessation. (Patient not taking: Reported on 05/01/2022) 100 tablet 0    nystatin cream (MYCOSTATIN) Apply to affected area 2 times daily (Patient not taking: Reported on 05/01/2022) 15 g 0    predniSONE (DELTASONE) 20 MG tablet Take 2 tablets (40 mg total) by mouth daily. (Patient not taking: Reported on  05/01/2022) 6 tablet 0    traZODone (DESYREL) 50 MG tablet Take 1 tablet (50 mg total) by mouth at bedtime as needed for sleep. (Patient not taking: Reported on 05/01/2022) 30 tablet 0     Patient Stressors: Medication change or noncompliance    Patient Strengths: Ability for insight  Active sense of humor  Average or above average intelligence  Capable of independent living  Communication skills   Treatment Modalities: Medication Management, Group therapy, Case management,  1 to 1 session with clinician, Psychoeducation, Recreational therapy.   Physician Treatment Plan for Primary Diagnosis: Bipolar disorder, curr episode mixed, severe, with psychotic features (HCC) Long Term Goal(s): Improvement in symptoms so as ready for discharge   Short Term Goals: Ability to identify changes in lifestyle to reduce recurrence of condition will improve Ability to verbalize feelings will improve Ability to disclose and discuss suicidal ideas Ability to demonstrate self-control will improve Ability to identify and develop effective coping behaviors will improve Ability to maintain clinical measurements within normal limits will improve Compliance with prescribed medications will improve Ability to identify triggers associated with substance abuse/mental health issues will improve  Medication Management: Evaluate patient's response, side effects, and tolerance of medication regimen.  Therapeutic Interventions: 1 to 1 sessions, Unit Group sessions and Medication administration.  Evaluation of Outcomes: Progressing  Physician Treatment Plan for Secondary Diagnosis: Principal Problem:   Bipolar disorder, curr episode mixed, severe, with psychotic features (HCC) Active Problems:   Cocaine use disorder, severe, dependence (HCC)   Cannabis use disorder, severe, dependence (HCC)   Polysubstance abuse (HCC)   MDMA abuse (HCC)   Suicidal ideation  Long Term Goal(s): Improvement in symptoms so as  ready for discharge   Short Term Goals: Ability to identify changes in lifestyle to reduce recurrence of condition will improve Ability to verbalize feelings will improve Ability to disclose and discuss suicidal ideas Ability to demonstrate self-control will improve Ability to identify and develop effective coping behaviors will improve Ability to maintain clinical measurements within normal limits will improve Compliance with prescribed medications will improve Ability to identify triggers associated with substance abuse/mental health issues will improve     Medication Management: Evaluate patient's response, side effects, and tolerance of medication regimen.  Therapeutic Interventions: 1 to 1 sessions, Unit Group sessions and Medication administration.  Evaluation of Outcomes: Progressing   RN Treatment Plan for Primary Diagnosis: Bipolar disorder, curr episode mixed, severe, with psychotic features (HCC) Long Term Goal(s): Knowledge of disease and therapeutic regimen to maintain health will improve  Short  Term Goals: Ability to remain free from injury will improve, Ability to verbalize frustration and anger appropriately will improve, Ability to participate in decision making will improve, Ability to verbalize feelings will improve, Ability to identify and develop effective coping behaviors will improve, and Compliance with prescribed medications will improve  Medication Management: RN will administer medications as ordered by provider, will assess and evaluate patient's response and provide education to patient for prescribed medication. RN will report any adverse and/or side effects to prescribing provider.  Therapeutic Interventions: 1 on 1 counseling sessions, Psychoeducation, Medication administration, Evaluate responses to treatment, Monitor vital signs and CBGs as ordered, Perform/monitor CIWA, COWS, AIMS and Fall Risk screenings as ordered, Perform wound care treatments as  ordered.  Evaluation of Outcomes: Progressing   LCSW Treatment Plan for Primary Diagnosis: Bipolar disorder, curr episode mixed, severe, with psychotic features ([place]) Long Term Goal(s): Safe transition to appropriate next level of care at discharge, Engage patient in therapeutic group addressing interpersonal concerns.  Short Term Goals: Engage patient in aftercare planning with referrals and resources, Increase social support, Increase emotional regulation, Facilitate acceptance of mental health diagnosis and concerns, Identify triggers associated with mental health/substance abuse issues, and Increase skills for wellness and recovery  Therapeutic Interventions: Assess for all discharge needs, 1 to 1 time with Social worker, Explore available resources and support systems, Assess for adequacy in community support network, Educate family and significant other(s) on suicide prevention, Complete Psychosocial Assessment, Interpersonal group therapy.  Evaluation of Outcomes: Progressing   Progress in Treatment: Attending groups: Yes. Participating in groups: Yes. Taking medication as prescribed: Yes. Toleration medication: Yes. Family/Significant other contact made: Yes, individual(s) contacted: Godmother  Patient understands diagnosis: Yes. Discussing patient identified problems/goals with staff: Yes. Medical problems stabilized or resolved: Yes. Denies suicidal/homicidal ideation: Yes. Issues/concerns per patient self-inventory: No.     New problem(s) identified: No, Describe:  None reported    New Short Term/Long Term Goal(s): medication stabilization, elimination of SI thoughts, development of comprehensive mental wellness plan.    Patient Goals: "To get the thoughts out of my head"   Discharge Plan or Barriers: Patient recently admitted. CSW will continue to follow and assess for appropriate referrals and possible discharge planning.    Reason for Continuation of Hospitalization:  Anxiety Depression Medication stabilization Suicidal ideation   Estimated Length of Stay: 3 to 7 days   Last [place] Suicide Severity Risk Score: Sophia Admission (Current) from 05/02/2022 in [facility] ED from 05/01/2022 in [facility] High Risk High Risk       Last PHQ 2/9 Scores:     No data to display          Scribe for Treatment Team: Charlett Lango 05/09/2022 11:26 AM

## 2022-05-09 NOTE — Progress Notes (Signed)
  Administered PRN Hydroxyzine and Trazodone per MAR per pt request. 

## 2022-05-09 NOTE — Progress Notes (Signed)
   05/09/22 0840  Psych Admission Type (Psych Patients Only)  Admission Status Voluntary  Psychosocial Assessment  Patient Complaints None  Eye Contact Fair  Facial Expression Flat  Affect Appropriate to circumstance  Speech Logical/coherent  Interaction Assertive  Motor Activity Slow  Appearance/Hygiene Unremarkable  Behavior Characteristics Cooperative;Appropriate to situation  Mood Pleasant  Aggressive Behavior  Effect No apparent injury  Thought Process  Coherency WDL  Content WDL  Delusions None reported or observed  Perception WDL  Hallucination None reported or observed  Judgment Limited  Confusion None  Danger to Self  Current suicidal ideation? Denies  Self-Injurious Behavior No self-injurious ideation or behavior indicators observed or expressed   Agreement Not to Harm Self Yes  Description of Agreement verbal  Danger to Others  Danger to Others None reported or observed

## 2022-05-09 NOTE — Plan of Care (Signed)
  Problem: Education: Goal: Knowledge of [facility] Education information/materials will improve Outcome: Progressing Goal: Emotional status will improve Outcome: Progressing Goal: Mental status will improve Outcome: Progressing Goal: Verbalization of understanding the information provided will improve Outcome: Progressing   Problem: Activity: Goal: Interest or engagement in activities will improve Outcome: Progressing Goal: Sleeping patterns will improve Outcome: Progressing   Problem: Coping: Goal: Ability to verbalize frustrations and anger appropriately will improve Outcome: Progressing Goal: Ability to demonstrate self-control will improve Outcome: Progressing   

## 2022-05-09 NOTE — Group Note (Signed)
Recreation Therapy Group Note   Group Topic:Stress Management  Group Date: 05/09/2022 Start Time: 0935 End Time: 0955 Facilitators: Deasha Clendenin-McCall, LRT,CTRS Location: [address] Dayroom   Goal Area(s) Addresses:  Patient will identify positive stress management techniques. Patient will identify benefits of using stress management post d/c.  Group Description:  Meditation.  LRT played a meditation for the patients that focused on having a fresh new start to your day.  Patients were to listen and follow along as meditation played to fully encompass what the meditation is saying.   Affect/Mood: Appropriate   Participation Level: Engaged   Participation Quality: Independent   Behavior: Appropriate   Speech/Thought Process: Focused   Insight: Good   Judgement: Good   Modes of Intervention: Meditation   Patient Response to Interventions:  Engaged   Education Outcome:  Acknowledges education and In group clarification offered    Clinical Observations/Individualized Feedback: Pt attended and participated in group session.     Plan: Continue to engage patient in RT group sessions 2-3x/week.   Kalecia Hartney-McCall, LRT,CTRS  05/09/2022 1:31 PM

## 2022-05-10 DIAGNOSIS — F3164 Bipolar disorder, current episode mixed, severe, with psychotic features: Secondary | ICD-10-CM

## 2022-05-10 MED ORDER — NICOTINE 21 MG/24HR TD PT24: 21.0000 mg | MEDICATED_PATCH | Freq: Every day | TRANSDERMAL | 0 refills | Status: AC

## 2022-05-10 MED ORDER — TRAZODONE HCL 50 MG PO TABS: 50.0000 mg | ORAL_TABLET | Freq: Every evening | ORAL | 0 refills | Status: AC | PRN

## 2022-05-10 MED ORDER — NICOTINE 21 MG/24HR TD PT24: 21.0000 mg | MEDICATED_PATCH | Freq: Every day | TRANSDERMAL | 0 refills | Status: DC

## 2022-05-10 MED ORDER — LURASIDONE HCL 40 MG PO TABS: 40.0000 mg | ORAL_TABLET | Freq: Every day | ORAL | 0 refills | Status: DC

## 2022-05-10 MED ORDER — ALBUTEROL SULFATE HFA 108 (90 BASE) MCG/ACT IN AERS: 1.0000 | INHALATION_SPRAY | RESPIRATORY_TRACT | 0 refills | Status: DC | PRN

## 2022-05-10 MED ORDER — LURASIDONE HCL 40 MG PO TABS: 40.0000 mg | ORAL_TABLET | Freq: Every day | ORAL | 30 refills | Status: DC

## 2022-05-10 MED ORDER — HYDROXYZINE HCL 25 MG PO TABS: 25.0000 mg | ORAL_TABLET | Freq: Three times a day (TID) | ORAL | 0 refills | Status: AC | PRN

## 2022-05-10 NOTE — Progress Notes (Signed)
Order received for patients discharge. AVS reviewed with patient at length as well as medications. Pt verbalized understanding of follow up care plan. Opportunity provided for questions. Pt denies any SI HI or AVHallucinations. Patient given cab voucher as well as given bus pass for future appointment.  All belongings returned and patient escorted from unit to lobby.

## 2022-05-10 NOTE — BHH Suicide Risk Assessment (Signed)
Suicide Risk Assessment  Discharge Assessment    [facility] Discharge Suicide Risk Assessment  Principal Problem: Bipolar disorder, curr episode mixed, severe, with psychotic features [facility]) Discharge Diagnoses: Principal Problem:   Bipolar disorder, curr episode mixed, severe, with psychotic features (HCC) Active Problems:   Cocaine use disorder, severe, dependence (HCC)   Cannabis use disorder, severe, dependence (HCC)   Polysubstance abuse (HCC)   MDMA abuse (HCC)   Suicidal ideation  Total Time spent with patient: 1 hour  Musculoskeletal: Strength & Muscle Tone: within normal limits Gait & Station: normal Patient leans: [name]  Psychiatric Specialty Exam  Presentation  General Appearance:  Appropriate for Environment; Casual; Fairly Groomed  Eye Contact: Good  Speech: Clear and Coherent; Normal Rate  Speech Volume: Normal  Handedness: Right  Mood and Affect  Mood: Euthymic  Duration of Depression Symptoms: No data recorded Affect: Congruent  Thought Process  Thought Processes: Coherent  Descriptions of Associations:Intact  Orientation:Full (Time, Place and Person)  Thought Content:Logical  History of Schizophrenia/Schizoaffective disorder:No data recorded Duration of Psychotic Symptoms:No data recorded Hallucinations:Hallucinations: None Description of Auditory Hallucinations: Denies  Ideas of Reference:None  Suicidal Thoughts:Suicidal Thoughts: No SI Active Intent and/or Plan: -- (Denies) SI Passive Intent and/or Plan: -- (Denies)  Homicidal Thoughts:Homicidal Thoughts: No  Sensorium  Memory: Immediate Good; Recent Good  Judgment: Fair  Insight: Fair  Art therapist  Concentration: Good  Attention Span: Good  Recall: Fair  Fund of Knowledge: Fair  Language: Good  Psychomotor Activity  Psychomotor Activity: Psychomotor Activity: Normal  Assets  Assets: Communication Skills; Desire for Improvement; Physical Health;  Social Support  Sleep  Sleep: Sleep: Good Number of Hours of Sleep: 10  Physical Exam: Physical Exam Vitals and nursing note reviewed.  HENT:     Head: Normocephalic.     Right Ear: External ear normal.     Left Ear: External ear normal.     Nose: Nose normal.     Mouth/Throat:     Mouth: Mucous membranes are moist.     Pharynx: Oropharynx is clear.  Eyes:     Conjunctiva/sclera: Conjunctivae normal.     Pupils: Pupils are equal, round, and reactive to light.  Cardiovascular:     Rate and Rhythm: Normal rate.     Pulses: Normal pulses.  Pulmonary:     Effort: Pulmonary effort is normal.  Abdominal:     Palpations: Abdomen is soft.  Genitourinary:    Comments: Deferred Musculoskeletal:        General: Normal range of motion.     Cervical back: Normal range of motion.  Skin:    General: Skin is warm.  Neurological:     General: No focal deficit present.     Mental Status: He is alert and oriented to person, place, and time.  Psychiatric:        Mood and Affect: Mood normal.    Review of Systems  Constitutional: Negative.   HENT: Negative.    Eyes: Negative.   Respiratory: Negative.    Cardiovascular: Negative.   Gastrointestinal: Negative.   Genitourinary: Negative.   Musculoskeletal: Negative.   Skin: Negative.   Neurological: Negative.   Endo/Heme/Allergies: Negative.   Psychiatric/Behavioral:  Positive for depression (Stable with medications), substance abuse (Patient denies) and suicidal ideas (Patient denies). The patient is nervous/anxious (Stable with medication) and has insomnia (Improved  with medication).    Blood pressure 119/64, pulse 94, temperature 98.3 F (36.8 C), temperature source Oral, resp. rate 16, height 5\' 9"  (  1.753 m), weight 63 kg, SpO2 98 %. Body mass index is 20.53 kg/m.  Mental Status Per Nursing Assessment::   On Admission:  Suicidal ideation indicated by patient  Demographic Factors:  Male, Adolescent or young adult, Low  socioeconomic status, and Unemployed  Loss Factors: Decrease in vocational status, Loss of significant relationship, Decline in physical health, and Financial problems/change in socioeconomic status  Historical Factors: Prior suicide attempts and Impulsivity  Risk Reduction Factors:   Living with another person, especially a relative, Positive social support, Positive therapeutic relationship, and Positive coping skills or problem solving skills  Continued Clinical Symptoms:  Bipolar Disorder:   Depressive phase Depression:   Impulsivity Recent sense of peace/wellbeing More than one psychiatric diagnosis Previous Psychiatric Diagnoses and Treatments  Cognitive Features That Contribute To Risk:  Polarized thinking    Suicide Risk:  Minimal: No identifiable suicidal ideation.  Patients presenting with no risk factors but with morbid ruminations; may be classified as minimal risk based on the severity of the depressive symptoms   Follow-up Information     [facility]. Go on 05/12/2022.   Why: You have a hospital follow up appointment for therapy and medication management services on 05/12/22 at 10:00 am.  This appointment will be held in person. Contact information: [place] [address] [phone]                 Plan Of Care/Follow-up recommendations:  The patient is being discharged with his family. Patient is to take his discharge medications as ordered.  See follow up above. We recommend that he participate in individual therapy to target uncontrollable agitation and substance abuse.  We recommend that he participate in family therapy to target the conflict with his family, to improve communication skills and conflict resolution skills.  Family is to initiate/implement a contingency based behavioral model to address patient's behavior. We recommend that he gets AIMS scale, height, weight, blood pressure, fasting lipid panel, fasting  blood sugar in three months from discharge as he's on atypical antipsychotics.  Patient will benefit from monitoring of recurrent suicidal ideation since patient is on antidepressant medication. The patient should abstain from all illicit substances and alcohol.  If the patient's symptoms worsen or do not continue to improve or if the patient becomes actively suicidal or homicidal then it is recommended that the patient return to the closest hospital emergency room or call 911 for further evaluation and treatment. National Suicide Prevention Lifeline 1800-SUICIDE or [phone]. Please follow up with your primary medical doctor for all other medical needs.  The patient has been educated on the possible side effects to medications and he/his guardian is to contact a medical professional and inform outpatient provider of any new side effects of medication. He is to take regular diet and activity as tolerated.  Will benefit from moderate daily exercise. Family was educated about removing/locking any firearms, medications or dangerous products from the home.   Laretta Bolster, [name] 05/10/2022, 10:20 AM

## 2022-05-10 NOTE — BHH Group Notes (Signed)
Spiritual care group on grief and loss facilitated by Chaplain Janne Napoleon, [place], Chaplain Genesis Adams and Lysle Morales, [place] intern.    Group Goal:  Support / Education around grief and loss  Members engage in facilitated group support and psycho-social education.  Group Description:  Following introductions and group rules, group members engaged in facilitated group dialog and support around topic of loss, with particular support around experiences of loss in their lives. Group Identified types of loss (relationships / self / things) and identified patterns, circumstances, and changes that precipitate losses. Reflected on thoughts / feelings around loss, normalized grief responses, and recognized variety in grief experience.  Group drew on Adlerian / Rogerian, narrative, MI,  Patient Progress: [name] attended group and actively engaged and participated in group conversation and activities. There were times that he dominated the group, but was also able to be self-reflective about leaving space for others.  His comments showed good insight and he was extremely supportive of peers.  [address], [place] Pager, [phone]

## 2022-05-10 NOTE — Progress Notes (Signed)
Patient presents with pleasant mood, affect congruent. [name] reports he is feeling better and states '' I'm supposed to be going home today. My god mother is going to come get me at 26. I'm better, I'm not hearing the voices any more and I feel like the medication helped''  Pt reports eating and sleeping well. Completed self inventory and rated his depression and hopelessness at 0/10 on scale, 10 being worst, 0 being none. Pt also rates his anxiety at 2/10 on scale, 10 being worst, 0 being none. Pt states his goal is '' to go home. '' And to '' go to group and stay positive. ''  Pt denies any acute concerns. Denies any SI HI or AVH.  Above discussed in treatment team. Pt is safe.

## 2022-05-10 NOTE — Progress Notes (Signed)
  [facility] Adult Case Management Discharge Plan :  Will you be returning to the same living situation after discharge:  No. [facility] At discharge, do you have transportation home?: Yes,  Taxi  Do you have the ability to pay for your medications: Yes,  [employer]   Release of information consent forms completed and in the chart;  Patient's signature needed at discharge.  Patient to Follow up at:  Follow-up Information     [facility]. Go on 05/12/2022.   Why: You have a hospital follow up appointment for therapy and medication management services on 05/12/22 at 10:00 am.  This appointment will be held in person. Contact information: [place] [address] [phone]         [facility] Follow up.   Specialty: Behavioral Health Why: You may use walk-in hours at this agency Monday through Friday 7:00am to 4:00pm to begin therapy and medication management services. Contact information: [place] [phone]                Next level of care provider has access to [place] and Suicide Prevention discussed: Yes,  with patient and Godmother      Has patient been referred to the Quitline?: Patient refused referral  Patient has been referred for addiction treatment: Pt. refused referral, Patient may inquire about SAIOP/CDIOP services and groups at [facility] in [place] during his first appointment.   Darleen Crocker, LCSWA 05/10/2022, 10:43 AM

## 2022-05-10 NOTE — Discharge Summary (Signed)
Physician Discharge Summary Note  Patient:  Raymond Church is an 32 y.o., male MRN:  [id number] DOB:  01-22-1991 Patient phone:  [phone] (home)  Patient address:   [address] [place] [address],  Total Time spent with patient: 1 hour  Date of Admission:  05/02/2022 Date of Discharge:   05/10/2022  Reason for Admission: Raymond Church is a 32 y.o. male with past medical history of asthma, depression, polysubstance usage, bipolar disorder who presents to [facility] from [facility] ED with complaint of suicidal ideation with plan to step out into traffic.  Patient states over the past month he has had multiple death in his family, and he is unable to cope effectively.  Previously on psychiatric medications, which he discontinued several years ago for no specific reason. Denies drug or alcohol use, reports he used to use drugs years ago but quit.  Currently reports marijuana usage of 1 g twice daily.  Recently quit smoking however, relapsed on May 01, 2022 due to being overwhelmed with his stressors.  Denies regular alcohol use.  Patient currently lives with his Godmother    Principal Problem: Bipolar disorder, curr episode mixed, severe, with psychotic features (HCC) Discharge Diagnoses: Principal Problem:   Bipolar disorder, curr episode mixed, severe, with psychotic features (HCC) Active Problems:   Cocaine use disorder, severe, dependence (HCC)   Cannabis use disorder, severe, dependence (HCC)   Polysubstance abuse (HCC)   MDMA abuse (HCC)   Suicidal ideation   Past Psychiatric History: Previous Psych Diagnoses: Suicidal ideation, bipolar disorder current episode mixed with severe psychotic features, cannabis use disorder severe, cocaine use disorder severe dependence, MDMA abuse, polysubstance use and abuse, with Prior inpatient treatment: Patient has been admitted twice to behavioral health Hospital in 2015/09/06 and 2016-09-05.   Current/prior outpatient  treatment: Patient was treated at [facility] for polysubstance treatment Prior rehab hx: Applicable Psychotherapy hx: Treated at behavioral health Hospital History of suicide: Patient denies History of homicide or aggression: Patient denies Psychiatric medication history: Patient noncompliance with medications reports running out Psychiatric medication compliance history: Noncompliance Neuromodulation history: Not applicable Current Psychiatrist: None reported per Patient Current therapist: None reported by patient    Past Medical History:  Past Medical History:  Diagnosis Date   Asthma    Bipolar disorder (HCC)    Depression    Pneumonia     Past Surgical History:  Procedure Laterality Date   NO PAST SURGERIES     Family History:  Family History  Problem Relation Age of Onset   Bipolar disorder Mother    Drug abuse Mother    Family Psychiatric  History: Psych: Mom had bipolar disorder and depression Psych Rx: Patient does not remember treatment SA/HA: Mom had suicide attempts and homicidal attempt Substance use family hx: Patient and mom was using crack cocaine.  Mom died in 09/06/03   Social History:  Social History   Substance and Sexual Activity  Alcohol Use Never   Comment: denies     Social History   Substance and Sexual Activity  Drug Use Yes   Types: Marijuana, Methamphetamines   Comment: currently using    Social History   Socioeconomic History   Marital status: Single    Spouse name: Not on file   Number of children: Not on file   Years of education: Not on file   Highest education level: Not on file  Occupational History   Not on file  Tobacco Use  Smoking status: Every Day    Packs/day: 0.25    Years: 10.00    Total pack years: 2.50    Types: Cigarettes   Smokeless tobacco: Never   Tobacco comments:    refused  Vaping Use   Vaping Use: Never used  Substance and Sexual Activity   Alcohol use: Never    Comment: denies   Drug use: Yes     Types: Marijuana, Methamphetamines    Comment: currently using   Sexual activity: Yes  Other Topics Concern   Not on file  Social History Narrative   Not on file   Social Determinants of Health   Financial Resource Strain: Not on file  Food Insecurity: No Food Insecurity (05/02/2022)   Hunger Vital Sign    Worried About Running Out of Food in the Last Year: Never true    Ran Out of Food in the Last Year: Never true  Transportation Needs: No Transportation Needs (05/02/2022)   PRAPARE - Administrator, Civil ServiceTransportation    Lack of Transportation (Medical): No    Lack of Transportation (Non-Medical): No  Physical Activity: Not on file  Stress: Not on file  Social Connections: Not on file    Hospital Course:  .Hospital Course:   Patient was [facility] to the Adult unit [facility] under the service of Dr. Sherron FlemingsMassengill.  Routine labs reviewed, no new labs. CBC with diff: WBC 11.5 high, RBC 4.18 low, hemoglobin 12.7 low, RDW 15.9 high, lymphocyte 4.3 high, monocyte 1.1 high, eosinophil absolute 0.7 high, otherwise normal.  CMP: Sodium 134 low, glucose 113 high, calcium 8.4 low, otherwise normal.  Lipid profile: Triglyceride 180 high, otherwise normal.  UDS: Positive for amphetamine and tetrahydrocannabinol.  Patient to follow-up with outpatient provider for all abnormal labs. Maintained Q 15 minutes observation for safety. During this admission patient did not require any change in her observation level and no PRN or time out  was required. Length of stay was 9 days.  During this hospitalization the patient will receive psychosocial  assessment. Patient participated in  all forms of therapy including; group, milieu, and family therapy. Psychotherapy: Social and Doctor, hospitalcommunication skill training, anti-bullying, learning based strategies, cognitive behavioral, and family object relations individuation separation intervention psychotherapies can be considered.  During this admission patient met with  her psychiatrist daily and received full nursing service. An individualized treatment plan according to the patient's age, level of functioning, diagnostic considerations and acute behavior was initiated.  To continue to reduce current symptoms to base line and improve the patient's overall level of functioning continued Latuda tablet 40 mg p.o. daily with supper for bipolar disorder, trazodone 50 mg p.o. at bedtime as needed for sleep, melatonin 3 mg p.o. daily at bedtime, hydroxyzine tablet 25 mg p.o. 3 times daily as needed for anxiety, agitation protocol as needed for agitation and albuterol HFA 108 (90 base) micrograms/ACT inhaler 1 to 2 puffs every 4 hours as needed for wheezing. Patient is tolerating medications well with no side effects at discharge. Patient was able to verbalize reasons for living and a safety plan was developed.  He appears to have a positive outlook and is agreeable to continued therapy and medication management. Safety plan was discussed with guardian who is in agreement. Patient was provided with the national suicide hotline # 1-800-273-TALK as well as [facility]  number. Medications on admission were Latuda 40 mg tab p.o. every evening daily, prazosin 1 mg p.o. q. nightly for PTSD, trazodone 50  mg p.o. q. nightly as needed, melatonin tablets 5 mg p.o. at bedtime, nicotine patch 21 mg transdermal over 24 hours for smoking cessation, agitation protocol of Geodon 20 mg IM twice daily as needed, hydroxyzine 25 mg p.o. 3 times daily as needed, home medication of albuterol 1 to 2 puffs every 4 hours as needed for wheezing or shortness of breath. Patient has responded well to these medications and is sleeping better.  During the course of the hospitalization, the following changes to be made in medications: Prazosin was discontinued due to minimal weird dreams, patient did not require agitation protocol and mood was stable. At discharge, patient is medically stable  and basic physical exam is within normal limits with no abnormal findings.   Patient appeared to benefit from the structure and consistency of the inpatient unit, continued current medication therapy, and integrated therapies. During the hospitalization the patient gradually improved and expressed no suicidal or homicidal thoughts, plan or intent to act on a plan. His depressive symptoms and anxiety have appeared to subside and he has a positive outlook on his life going forward. He is able to express his feelings, and recognize when he is feeling overwhelmed. He has been calm and cooperative with no behavioral issues during this hospitalization.  A discharge conference was held with during which findings, recommendations, safety plans and aftercare plan were discussed with the caregiver. Please refer to the therapist note for further information about issues discussed on family session. At discharge patient denied psychotic symptoms, suicidal and homicidal ideation, intent or plan, and there was no evidence of manic or  depressive symptoms. Patient is discharged home in stable condition.  Physical Findings: AIMS:  , ,  ,  ,    CIWA:    COWS:     Musculoskeletal: Strength & Muscle Tone: within normal limits Gait & Station: normal Patient leans: [name]  Psychiatric Specialty Exam:  Presentation  General Appearance:  Appropriate for Environment; Casual; Fairly Groomed  Eye Contact: Good  Speech: Clear and Coherent; Normal Rate  Speech Volume: Normal  Handedness: Right  Mood and Affect  Mood: Euthymic  Affect: Congruent  Thought Process  Thought Processes: Coherent  Descriptions of Associations:Intact  Orientation:Full (Time, Place and Person)  Thought Content:Logical  History of Schizophrenia/Schizoaffective disorder:No data recorded Duration of Psychotic Symptoms:No data recorded Hallucinations:Hallucinations: None Description of Auditory Hallucinations:  Denies  Ideas of Reference:None  Suicidal Thoughts:Suicidal Thoughts: No SI Active Intent and/or Plan: -- (Denies) SI Passive Intent and/or Plan: -- (Denies)  Homicidal Thoughts:Homicidal Thoughts: No  Sensorium  Memory: Immediate Good; Recent Good  Judgment: Fair  Insight: Fair  Art therapist  Concentration: Good  Attention Span: Good  Recall: Fair  Fund of Knowledge: Fair  Language: Good  Psychomotor Activity  Psychomotor Activity: Psychomotor Activity: Normal  Assets  Assets: Communication Skills; Desire for Improvement; Physical Health; Social Support  Sleep  Sleep: Sleep: Good Number of Hours of Sleep: 10  Physical Exam: Physical Exam Vitals and nursing note reviewed.  HENT:     Head: Normocephalic.     Right Ear: External ear normal.     Left Ear: External ear normal.     Nose: Nose normal.     Mouth/Throat:     Mouth: Mucous membranes are moist.     Pharynx: Oropharynx is clear.  Eyes:     Pupils: Pupils are equal, round, and reactive to light.  Cardiovascular:     Rate and Rhythm: Normal rate.     Pulses:  Normal pulses.  Pulmonary:     Effort: Pulmonary effort is normal.  Abdominal:     Palpations: Abdomen is soft.  Genitourinary:    Comments: Deferred Musculoskeletal:        General: Normal range of motion.     Cervical back: Normal range of motion.  Skin:    General: Skin is warm.  Neurological:     General: No focal deficit present.     Mental Status: He is alert and oriented to person, place, and time.  Psychiatric:        Mood and Affect: Mood normal.        Behavior: Behavior normal.        Thought Content: Thought content normal.    Review of Systems  Constitutional: Negative.   HENT: Negative.    Eyes: Negative.   Respiratory: Negative.    Cardiovascular: Negative.   Gastrointestinal: Negative.   Genitourinary: Negative.   Musculoskeletal: Negative.   Skin: Negative.   Neurological: Negative.    Endo/Heme/Allergies: Negative.        See allergy list  Psychiatric/Behavioral:  Positive for depression (Stable with medication), substance abuse (Patient denies) and suicidal ideas (Patient denies). The patient is nervous/anxious (improved with medication) and has insomnia (Improved with medication).    Blood pressure 119/64, pulse 94, temperature 98.3 F (36.8 C), temperature source Oral, resp. rate 16, height 5\' 9"  (1.753 m), weight 63 kg, SpO2 98 %. Body mass index is 20.53 kg/m.   Social History   Tobacco Use  Smoking Status Every Day   Packs/day: 0.25   Years: 10.00   Total pack years: 2.50   Types: Cigarettes  Smokeless Tobacco Never  Tobacco Comments   refused   Tobacco Cessation:  A prescription for an FDA-approved tobacco cessation medication provided at discharge   Blood Alcohol level:  Lab Results  Component Value Date   [facility] <10 05/01/2022   ETH <5 05/03/2016    Metabolic Disorder Labs:  Lab Results  Component Value Date   HGBA1C 5.2 05/06/2016   MPG 103 05/06/2016   Lab Results  Component Value Date   PROLACTIN 22.5 (H) 05/06/2016   Lab Results  Component Value Date   CHOL 125 05/06/2016   TRIG 180 (H) 05/06/2016   HDL 42 05/06/2016   CHOLHDL 3.0 05/06/2016   VLDL 36 05/06/2016   LDLCALC 47 05/06/2016    See Psychiatric Specialty Exam and Suicide Risk Assessment completed by Attending Physician prior to discharge.  Discharge destination:  Home  Is patient on multiple antipsychotic therapies at discharge:  No   Has Patient had three or more failed trials of antipsychotic monotherapy by history:  No  Recommended Plan for Multiple Antipsychotic Therapies: NA  Discharge Instructions     Diet - low sodium heart healthy   Complete by: As directed    Increase activity slowly   Complete by: As directed       Allergies as of 05/10/2022       Reactions   Shellfish Allergy Anaphylaxis   Amoxicillin Other (See Comments)   Has patient had a  PCN reaction causing immediate rash, facial/tongue/throat swelling, SOB or lightheadedness with hypotension: No Has patient had a PCN reaction causing severe rash involving mucus membranes or skin necrosis: No Has patient had a PCN reaction that required hospitalization No Has patient had a PCN reaction occurring within the last 10 years: No If all of the above answers are "NO", then may proceed with Cephalosporin use.  Penicillins Other (See Comments)   Has patient had a PCN reaction causing immediate rash, facial/tongue/throat swelling, SOB or lightheadedness with hypotension: No Has patient had a PCN reaction causing severe rash involving mucus membranes or skin necrosis: No Has patient had a PCN reaction that required hospitalization No Has patient had a PCN reaction occurring within the last 10 years: No If all of the above answers are "NO", then may proceed with Cephalosporin use.   Mushroom Extract Complex Nausea And Vomiting        Medication List     STOP taking these medications    albuterol 108 (90 Base) MCG/ACT inhaler Commonly known as: VENTOLIN HFA   carbamazepine 100 MG chewable tablet Commonly known as: TEGRETOL   hydrOXYzine 25 MG tablet Commonly known as: ATARAX   nicotine polacrilex 2 MG gum Commonly known as: NICORETTE   nystatin cream Commonly known as: MYCOSTATIN   predniSONE 20 MG tablet Commonly known as: DELTASONE   traZODone 50 MG tablet Commonly known as: DESYREL       TAKE these medications      Indication  lurasidone 40 MG Tabs tablet Commonly known as: LATUDA Take 1 tablet (40 mg total) by mouth daily with supper.  Indication: Depressive Phase of Manic-Depression   nicotine 21 mg/24hr patch Commonly known as: NICODERM CQ - dosed in mg/24 hours Place 1 patch (21 mg total) onto the skin daily. Start taking on: May 11, 2022  Indication: Nicotine Addiction        Follow-up Information     Services, [facility]. Go on  05/12/2022.   Why: You have a hospital follow up appointment for therapy and medication management services on 05/12/22 at 10:00 am.  This appointment will be held in person. Contact information: [address] [place] [address] [phone]                 Follow-up recommendations:  Activity:  As tolerated Diet:  Regular diet, no added salt  Comments:  The patient is being discharged with his family. Patient is to take his discharge medications as ordered.  See follow up above. We recommend that he participate in individual therapy to target uncontrollable agitation and substance abuse.  We recommend that he participate in family therapy to target the conflict with his family, to improve communication skills and conflict resolution skills.  Family is to initiate/implement a contingency based behavioral model to address patient's behavior. We recommend that he gets AIMS scale, height, weight, blood pressure, fasting lipid panel, fasting blood sugar in three months from discharge as he's on atypical antipsychotics.  Patient will benefit from monitoring of recurrent suicidal ideation since patient is on antidepressant medication. The patient should abstain from all illicit substances and alcohol.  If the patient's symptoms worsen or do not continue to improve or if the patient becomes actively suicidal or homicidal then it is recommended that the patient return to the closest hospital emergency room or call 911 for further evaluation and treatment. National Suicide Prevention Lifeline 1800-SUICIDE or 979-207-9073. Please follow up with your primary medical doctor for all other medical needs.  The patient has been educated on the possible side effects to medications and he/his guardian is to contact a medical professional and inform outpatient provider of any new side effects of medication. He is to take regular diet and activity as tolerated.  Will benefit from moderate daily  exercise. Family was educated about removing/locking any firearms, medications or dangerous products from the  home.    Signed: Laretta Bolster, [name] 05/10/2022, 10:29 AM

## 2022-05-10 NOTE — Discharge Instructions (Signed)
-  Follow-up with your outpatient psychiatric provider -instructions on appointment date, time, and address (location) are provided to you in discharge paperwork.  -Take your psychiatric medications as prescribed at discharge - instructions are provided to you in the discharge paperwork  -Follow-up with outpatient primary care doctor and other specialists -for management of preventative medicine and any chronic medical disease.  -Recommend abstinence from alcohol, tobacco, and other illicit drug use at discharge.   -If your psychiatric symptoms recur, worsen, or if you have side effects to your psychiatric medications, call your outpatient psychiatric provider, 911, [phone] or go to the nearest emergency department.  -If suicidal thoughts occur, call your outpatient psychiatric provider, 911, [phone] or go to the nearest emergency department.  Naloxone (Narcan) can help reverse an overdose when given to the victim quickly.  [place] offers free naloxone kits and instructions/training on its use.  Add naloxone to your first aid kit and you can help save a life.   Pick up your free kit at the following locations:   [place]:  [facility], [address] [place] [address] ([phone]) [facility] [address] [place] [address] ([phone]) [facility] [address] [name] [address]  [place]: [facility] [address] [place] [address] ([phone]) [facility] [address] [place] [address] ([phone])  

## 2022-05-10 NOTE — BHH Counselor (Signed)
CSW spoke with the Pt's Godmother about discharge planning.  She stated that the Pt could not return to her home at this time.  She provided the CSW with a phone number to [facility] in [place].  CSW contacted [facility] and was infomred that the there are no beds available until 05/13/22 or 05/16/22.  CSW provided the Pt with his information and the phone number to contact the agency.  The Pt was discharged to the [facility] in [place].

## 2022-11-19 ENCOUNTER — Emergency Department (HOSPITAL_COMMUNITY): Payer: Commercial Managed Care - HMO

## 2022-11-19 ENCOUNTER — Encounter (HOSPITAL_COMMUNITY): Payer: Self-pay

## 2022-11-19 ENCOUNTER — Emergency Department (HOSPITAL_COMMUNITY)
Admission: EM | Admit: 2022-11-19 | Discharge: 2022-11-19 | Disposition: A | Discharge status: Home / Self Care | Payer: Commercial Managed Care - HMO | Attending: Emergency Medicine | Admitting: Emergency Medicine

## 2022-11-19 ENCOUNTER — Other Ambulatory Visit: Payer: Self-pay

## 2022-11-19 DIAGNOSIS — J4541 Moderate persistent asthma with (acute) exacerbation: Secondary | ICD-10-CM | POA: Diagnosis not present

## 2022-11-19 DIAGNOSIS — J209 Acute bronchitis, unspecified: Secondary | ICD-10-CM | POA: Diagnosis not present

## 2022-11-19 DIAGNOSIS — J40 Bronchitis, not specified as acute or chronic: Secondary | ICD-10-CM

## 2022-11-19 DIAGNOSIS — R0603 Acute respiratory distress: Secondary | ICD-10-CM | POA: Diagnosis present

## 2022-11-19 LAB — BASIC METABOLIC PANEL
Anion gap: 13 (ref 5–15)
BUN: 6 mg/dL (ref 6–20)
CO2: 28 mmol/L (ref 22–32)
Calcium: 8.9 mg/dL (ref 8.9–10.3)
Chloride: 100 mmol/L (ref 98–111)
Creatinine, Ser: 1 mg/dL (ref 0.61–1.24)
GFR, Estimated: >60 mL/min (ref >60)
Glucose, Bld: 97 mg/dL (ref 70–99)
Potassium: 3.8 mmol/L (ref 3.5–5.1)
Sodium: 141 mmol/L (ref 135–145)

## 2022-11-19 LAB — CBC
HCT: 42.4 % (ref 39.0–52.0)
Hemoglobin: 13.7 g/dL (ref 13.0–17.0)
MCH: 30.4 pg (ref 26.0–34.0)
MCHC: 32.3 g/dL (ref 30.0–36.0)
MCV: 94 fL (ref 80.0–100.0)
Platelets: 322 10*3/uL (ref 150–400)
RBC: 4.51 MIL/uL (ref 4.22–5.81)
RDW: 13.9 % (ref 11.5–15.5)
WBC: 13.7 10*3/uL — ABNORMAL HIGH (ref 4.0–10.5)
nRBC: 0 % (ref 0.0–0.2)

## 2022-11-19 MED ORDER — ALBUTEROL SULFATE HFA 108 (90 BASE) MCG/ACT IN AERS: 1.0000 | INHALATION_SPRAY | Freq: Four times a day (QID) | RESPIRATORY_TRACT | 1 refills | Status: DC | PRN

## 2022-11-19 MED ORDER — PREDNISONE 20 MG PO TABS: 40.0000 mg | ORAL_TABLET | Freq: Every day | ORAL | 0 refills | Status: AC

## 2022-11-19 MED ORDER — MAGNESIUM SULFATE 2 GM/50ML IV SOLN
2.0000 g | Freq: Once | INTRAVENOUS | Status: AC
  Administered 2022-11-19: 2 g via INTRAVENOUS
  Filled 2022-11-19: qty 50

## 2022-11-19 MED ORDER — ALBUTEROL SULFATE (2.5 MG/3ML) 0.083% IN NEBU: 2.5000 mg | INHALATION_SOLUTION | Freq: Four times a day (QID) | RESPIRATORY_TRACT | 12 refills | Status: DC | PRN

## 2022-11-19 MED ORDER — IPRATROPIUM-ALBUTEROL 0.5-2.5 (3) MG/3ML IN SOLN
3.0000 mL | Freq: Once | RESPIRATORY_TRACT | Status: AC
  Administered 2022-11-19: 3 mL via RESPIRATORY_TRACT
  Filled 2022-11-19: qty 3

## 2022-11-19 NOTE — ED Provider Notes (Signed)
[place] EMERGENCY DEPARTMENT AT [facility] Provider Note   CSN: [id number] Arrival date & time: 11/19/22  1057     History  Chief Complaint  Patient presents with   Respiratory Distress    Raymond Church is a 32 y.o. male with past medical history significant for asthma, drug abuse who presents with concern for respiratory distress.  He has had a history of asthma, reports he ran out of his home inhaler, nebulizer 2 days ago.  He reports triggers include allergies around this time of year.  He denies any recent alcohol, drug use.  On EMS arrival he was with tripoding, increased respiratory effort.  22-24 respirations per minute on EMS arrival.  93% on room air.  Diffuse wheezing in all fields.  He had 10 mg of total albuterol prior to arrival and 125 mg of Solu-Medrol prior to arrival.  Some sinus arrhythmia noted.  He endorses some sharp 8/10 diffuse chest pain.  HPI     Home Medications Prior to Admission medications   Medication Sig Start Date End Date Taking? Authorizing Provider  albuterol (PROVENTIL) (2.5 MG/3ML) 0.083% nebulizer solution Take 3 mLs (2.5 mg total) by nebulization every 6 (six) hours as needed for wheezing or shortness of breath. 11/19/22  Yes Jabri Blancett H, PA-C  albuterol (VENTOLIN HFA) 108 (90 Base) MCG/ACT inhaler Inhale 1-2 puffs into the lungs every 6 (six) hours as needed for wheezing or shortness of breath. 11/19/22  Yes Khaden Gater H, PA-C  predniSONE (DELTASONE) 20 MG tablet Take 2 tablets (40 mg total) by mouth daily. 11/19/22  Yes Nakia Koble H, PA-C  lurasidone (LATUDA) 40 MG TABS tablet Take 1 tablet (40 mg total) by mouth daily with supper. 05/10/22 06/09/22  [name], Harrold Donath, MD  nicotine (NICODERM CQ - DOSED IN MG/24 HOURS) 21 mg/24hr patch Place 1 patch (21 mg total) onto the skin daily. 05/11/22   [name], Harrold Donath, MD  traZODone (DESYREL) 50 MG tablet Take 1 tablet (50 mg total) by mouth at bedtime as needed for  sleep. 05/10/22 06/09/22  [name], Harrold Donath, MD      Allergies    Shellfish allergy, Amoxicillin, Penicillins, and Mushroom extract complex    Review of Systems   Review of Systems  Respiratory:  Positive for shortness of breath.   All other systems reviewed and are negative.   Physical Exam Updated Vital Signs BP 119/75   Pulse 93   Temp 98.1 F (36.7 C) (Oral)   Resp 16   SpO2 100%  Physical Exam Vitals and nursing note reviewed.  Constitutional:      General: He is not in acute distress.    Appearance: Normal appearance.  HENT:     Head: Normocephalic and atraumatic.  Eyes:     General:        Right eye: No discharge.        Left eye: No discharge.  Cardiovascular:     Rate and Rhythm: Normal rate and regular rhythm.     Heart sounds: No murmur heard.    No friction rub. No gallop.  Pulmonary:     Effort: Pulmonary effort is normal.     Breath sounds: Normal breath sounds.     Comments: Wheezing, scattered rhonchi in bilateral lung fields, with inspiratory and expiratory on arrival.  He is tachypneic with increased work of breathing on arrival.  On reassessment patient with significant improvement of wheezing, rhonchi, some very mild expiratory wheezing still noted but overall with significantly  improved work of breathing and wheezing on reassessment. Abdominal:     General: Bowel sounds are normal.     Palpations: Abdomen is soft.  Skin:    General: Skin is warm and dry.     Capillary Refill: Capillary refill takes less than 2 seconds.  Neurological:     Mental Status: He is alert and oriented to person, place, and time.  Psychiatric:        Mood and Affect: Mood normal.        Behavior: Behavior normal.     ED Results / Procedures / Treatments   Labs (all labs ordered are listed, but only abnormal results are displayed) Labs Reviewed  CBC - Abnormal; Notable for the following components:      Result Value   WBC 13.7 (*)    All other components within  normal limits  BASIC METABOLIC PANEL    EKG EKG Interpretation Date/Time:  Saturday November 19 2022 11:04:46 EDT Ventricular Rate:  77 PR Interval:  148 QRS Duration:  72 QT Interval:  370 QTC Calculation: 419 R Axis:   79  Text Interpretation: Sinus arrhythmia ST elev, probable normal early repol pattern Similar to prior EKGs Confirmed by Vivi Barrack [phone]) on 11/19/2022 11:25:53 AM  Radiology DG Chest Portable 1 View  Result Date: 11/19/2022 CLINICAL DATA:  Short of breath. EXAM: PORTABLE CHEST 1 VIEW COMPARISON:  10/12/2016 FINDINGS: The heart size and mediastinal contours are unremarkable. No pleural fluid, interstitial edema or airspace disease. Mild peribronchial cuffing identified. Visualized osseous structures appear unremarkable. IMPRESSION: Mild peribronchial cuffing which may reflect bronchitis. No airspace consolidation Electronically Signed   By: Signa Kell M.D.   On: 11/19/2022 11:36    Procedures Procedures    Medications Ordered in ED Medications  magnesium sulfate IVPB 2 g 50 mL (0 g Intravenous Stopped 11/19/22 1243)  ipratropium-albuterol (DUONEB) 0.5-2.5 (3) MG/3ML nebulizer solution 3 mL (3 mLs Nebulization Given 11/19/22 1120)    ED Course/ Medical Decision Making/ A&P                             Medical Decision Making Amount and/or Complexity of Data Reviewed Labs: ordered. Radiology: ordered.  Risk Prescription drug management.   This patient is a 32 y.o. male  who presents to the ED for concern of shob, respiratory distress.   Differential diagnoses prior to evaluation: The emergent differential diagnosis includes, but is not limited to,  asthma exacerbation, COPD exacerbation, acute upper respiratory infection, acute bronchitis, chronic bronchitis, interstitial lung disease, ARDS, PE, pneumonia, atypical ACS, carbon monoxide poisoning, spontaneous pneumothorax, new CHF vs CHF exacerbation, versus other . This is not an exhaustive  differential.   Past Medical History / Co-morbidities / Social History: Asthma, cocaine abuse, cannabis use, polysubstance abuse, bipolar disorder  Additional history: Chart reviewed. Pertinent results include: Reviewed lab work, imaging from previous emergency department visits  Physical Exam: Physical exam performed. The pertinent findings include: Wheezing, scattered rhonchi in bilateral lung fields, with inspiratory and expiratory on arrival.  He is tachypneic with increased work of breathing on arrival.  On reassessment patient with significant improvement of wheezing, rhonchi, some very mild expiratory wheezing still noted but overall with significantly improved work of breathing and wheezing on reassessment.  On arrival patient with some hypertension, blood pressure 144/80 likely secondary to increased work of breathing, stress.  He had tachycardia after initial DuoNeb administrations, pulse max at 111.  No  tachypnea in the emergency department, stable oxygen saturation on room air.  Lab Tests/Imaging studies: I personally interpreted labs/imaging and the pertinent results include:  bmp unremarkable CBC with mild leukocytosis likely secondary to stress, solumedrol pta. I independently interpreted imaging including plain film chest xray which shows no evidence of pneumonia, mild peribronchial thickening noted suggestive of bronchitis.. I agree with the radiologist interpretation.  Cardiac monitoring: EKG obtained and interpreted by myself and attending physician which shows: Normal sinus rhythm, no significant change from previous baseline, some arrhythmia with normal complex QRS   Medications: I ordered medication including magnesium sulfate, DuoNeb, on reassessment patient with significant improvement of wheezing, work of breathing, continues to be stable oxygen saturation on room air..  I have reviewed the patients home medicines and have made adjustments as needed.  Will discharge  with prednisone burst and refill his albuterol   Disposition: After consideration of the diagnostic results and the patients response to treatment, I feel that patient stable for discharge with asthma exacerbation, return precautions given.   emergency department workup does not suggest an emergent condition requiring admission or immediate intervention beyond what has been performed at this time. The plan is: as above. The patient is safe for discharge and has been instructed to return immediately for worsening symptoms, change in symptoms or any other concerns.  Final Clinical Impression(s) / ED Diagnoses Final diagnoses:  Moderate persistent asthma with exacerbation  Bronchitis    Rx / DC Orders ED Discharge Orders          Ordered    predniSONE (DELTASONE) 20 MG tablet  Daily        11/19/22 1350    albuterol (VENTOLIN HFA) 108 (90 Base) MCG/ACT inhaler  Every 6 hours PRN        11/19/22 1350    albuterol (PROVENTIL) (2.5 MG/3ML) 0.083% nebulizer solution  Every 6 hours PRN        11/19/22 1350              Arynn Armand, Lillie H, PA-C 11/19/22 1449    Loetta Rough, MD 11/20/22 [phone]

## 2022-11-19 NOTE — Discharge Instructions (Addendum)
Please refill your inhaler and take the entire course of steroids that provided, and follow-up with your primary care doctor to ensure that your symptoms are improving after treatment

## 2022-11-19 NOTE — ED Notes (Signed)
Pt provided with sandwich bag and drink, no other needs at this time

## 2022-11-19 NOTE — ED Triage Notes (Addendum)
Pt bib ems from home; called out for resp distress; hx asthma; ran out of inhaler two days ago, sob progressively worse; tripoding, RR 22--24 x / min ems arrival; RA sat 93%; diffuse wheezing all fields; 10 mg albuterol given pta; 125 solu medrol given PTA; 132/70, HR 70-80, sinus arrhythmia, no history of same, endorses 8/10 chest pain ; 100% on neb; RR 18-20; 18 ga lac

## 2023-08-01 ENCOUNTER — Emergency Department (HOSPITAL_BASED_OUTPATIENT_CLINIC_OR_DEPARTMENT_OTHER)
Admission: EM | Admit: 2023-08-01 | Discharge: 2023-08-01 | Disposition: A | Discharge status: Home / Self Care | Attending: Emergency Medicine | Admitting: Emergency Medicine

## 2023-08-01 ENCOUNTER — Other Ambulatory Visit: Payer: Self-pay

## 2023-08-01 ENCOUNTER — Encounter (HOSPITAL_BASED_OUTPATIENT_CLINIC_OR_DEPARTMENT_OTHER): Payer: Self-pay | Admitting: Emergency Medicine

## 2023-08-01 DIAGNOSIS — J020 Streptococcal pharyngitis: Secondary | ICD-10-CM | POA: Insufficient documentation

## 2023-08-01 DIAGNOSIS — R070 Pain in throat: Secondary | ICD-10-CM | POA: Diagnosis not present

## 2023-08-01 DIAGNOSIS — J029 Acute pharyngitis, unspecified: Secondary | ICD-10-CM | POA: Diagnosis present

## 2023-08-01 DIAGNOSIS — R000 Tachycardia, unspecified: Secondary | ICD-10-CM | POA: Diagnosis not present

## 2023-08-01 LAB — RESP PANEL BY RT-PCR (RSV, FLU A&B, COVID)  RVPGX2
Influenza A by PCR: NEGATIVE
Influenza B by PCR: NEGATIVE
Resp Syncytial Virus by PCR: NEGATIVE
SARS Coronavirus 2 by RT PCR: NEGATIVE

## 2023-08-01 LAB — GROUP A STREP BY PCR: Group A Strep by PCR: DETECTED — AB

## 2023-08-01 MED ORDER — CEFUROXIME AXETIL 250 MG PO TABS
250.0000 mg | ORAL_TABLET | Freq: Once | ORAL | Status: DC
  Filled 2023-08-01: qty 1

## 2023-08-01 MED ORDER — CLINDAMYCIN HCL 150 MG PO CAPS
450.0000 mg | ORAL_CAPSULE | Freq: Once | ORAL | Status: AC
  Administered 2023-08-01: 450 mg via ORAL
  Filled 2023-08-01: qty 3

## 2023-08-01 MED ORDER — LIDOCAINE VISCOUS HCL 2 % MT SOLN
15.0000 mL | Freq: Once | OROMUCOSAL | Status: AC
  Administered 2023-08-01: 15 mL via OROMUCOSAL
  Filled 2023-08-01: qty 15

## 2023-08-01 MED ORDER — ALBUTEROL SULFATE HFA 108 (90 BASE) MCG/ACT IN AERS
1.0000 | INHALATION_SPRAY | Freq: Once | RESPIRATORY_TRACT | Status: AC
  Administered 2023-08-01: 2 via RESPIRATORY_TRACT
  Filled 2023-08-01: qty 6.7

## 2023-08-01 MED ORDER — DEXAMETHASONE SODIUM PHOSPHATE 10 MG/ML IJ SOLN
10.0000 mg | Freq: Once | INTRAMUSCULAR | Status: AC
  Administered 2023-08-01: 10 mg via INTRAMUSCULAR
  Filled 2023-08-01: qty 1

## 2023-08-01 MED ORDER — AZITHROMYCIN 250 MG PO TABS: 250.0000 mg | ORAL_TABLET | Freq: Every day | ORAL | 0 refills | Status: AC

## 2023-08-01 NOTE — ED Notes (Signed)
Pt waiting for cab 

## 2023-08-01 NOTE — ED Triage Notes (Signed)
 Sorethroat x 1 week  hurts to swallow fely walrm per ems low grade fever 99.4 oral given 650 tylenol EMS vitals HR 104 BP 108 65 CBG 128  (&  RA

## 2023-08-01 NOTE — ED Provider Notes (Signed)
 [place] EMERGENCY DEPARTMENT AT [facility] Provider Note   CSN: [id number] Arrival date & time: 08/01/23  1530     History  Chief Complaint  Patient presents with   Sore Throat    Raymond Church is a 33 y.o. male with cocaine dependence, polysubstance abuse, bipolar disorder presenting with sore throat for one week associated with low grade fever.  Patient reports that sore throat has gradually been getting worse.  Unsure of what his temperature was running at home as he does not have a thermometer he reports he just felt warm.  He was given Tylenol by EMS which she reports helped significantly.  He has no problem handling secretions or with phonation.  Denies any cough, headache, abdominal pain nausea vomiting diarrhea.  He reports anaphylaxis allergy to amoxicillin and penicillins.  Denies chest pain or shortness of breath but has history of asthma and reports he needs albuterol inhaler refill.    Sore Throat       Home Medications Prior to Admission medications   Medication Sig Start Date End Date Taking? Authorizing Provider  albuterol (PROVENTIL) (2.5 MG/3ML) 0.083% nebulizer solution Take 3 mLs (2.5 mg total) by nebulization every 6 (six) hours as needed for wheezing or shortness of breath. 11/19/22   Prosperi, Christian H, PA-C  albuterol (VENTOLIN HFA) 108 (90 Base) MCG/ACT inhaler Inhale 1-2 puffs into the lungs every 6 (six) hours as needed for wheezing or shortness of breath. 11/19/22   Prosperi, Christian H, PA-C  lurasidone (LATUDA) 40 MG TABS tablet Take 1 tablet (40 mg total) by mouth daily with supper. 05/10/22 06/09/22  [name], Harrold Donath, MD  nicotine (NICODERM CQ - DOSED IN MG/24 HOURS) 21 mg/24hr patch Place 1 patch (21 mg total) onto the skin daily. 05/11/22   [name], Harrold Donath, MD  predniSONE (DELTASONE) 20 MG tablet Take 2 tablets (40 mg total) by mouth daily. 11/19/22   Prosperi, Christian H, PA-C  traZODone (DESYREL) 50 MG tablet Take 1 tablet (50 mg  total) by mouth at bedtime as needed for sleep. 05/10/22 06/09/22  [name], Harrold Donath, MD      Allergies    Shellfish allergy, Amoxicillin, Penicillins, and Mushroom extract complex (obsolete)    Review of Systems   Review of Systems  HENT:  Positive for sore throat.     Physical Exam Updated Vital Signs BP 107/70 (BP Location: Left Arm)   Pulse 100   Temp 99.4 F (37.4 C)   Resp 18   Ht 5\' 9"  (1.753 m)   Wt 67.1 kg   SpO2 98%   BMI 21.86 kg/m  Physical Exam Vitals and nursing note reviewed.  Constitutional:      General: He is not in acute distress.    Appearance: He is not toxic-appearing.  HENT:     Head: Normocephalic and atraumatic.     Right Ear: Tympanic membrane normal.     Left Ear: Tympanic membrane normal.     Mouth/Throat:     Mouth: Mucous membranes are moist. No oral lesions.     Pharynx: Posterior oropharyngeal erythema present. No pharyngeal swelling, oropharyngeal exudate or uvula swelling.     Tonsils: No tonsillar exudate or tonsillar abscesses.  Eyes:     General: No scleral icterus.    Conjunctiva/sclera: Conjunctivae normal.  Cardiovascular:     Rate and Rhythm: Normal rate and regular rhythm.     Pulses: Normal pulses.     Heart sounds: Normal heart sounds.  Pulmonary:  Effort: Pulmonary effort is normal. No respiratory distress.     Breath sounds: Normal breath sounds.  Abdominal:     General: Abdomen is flat. Bowel sounds are normal.     Palpations: Abdomen is soft.     Tenderness: There is no abdominal tenderness.  Skin:    General: Skin is warm and dry.     Findings: No lesion.  Neurological:     General: No focal deficit present.     Mental Status: He is alert and oriented to person, place, and time. Mental status is at baseline.     ED Results / Procedures / Treatments   Labs (all labs ordered are listed, but only abnormal results are displayed) Labs Reviewed  GROUP A STREP BY PCR - Abnormal; Notable for the following  components:      Result Value   Group A Strep by PCR DETECTED (*)    All other components within normal limits  RESP PANEL BY RT-PCR (RSV, FLU A&B, COVID)  [id number]    EKG None  Radiology No results found.  Procedures Procedures    Medications Ordered in ED Medications - No data to display  ED Course/ Medical Decision Making/ A&P                                 Medical Decision Making Risk Prescription drug management.   [name] 33 y.o. presented today for URI like symptoms. Working DDx that I considered at this time includes, but not limited to, viral illness, pharyngitis, mono, sinusitis, electrolyte abnormality, AOM.  R/o DDx: these additional diagnoses are not consistent with patient's history, presentation, physical exam, labs/imaging findings.   Labs:  Respiratory Panel: Negative Group A Strep: Positive    Problem List / ED Course / Critical interventions / Medication management  Patient with history of substance abuse, asthma, bipolar disorder is presenting to emergency room with 1 week of sore throat.  He is hemodynamically stable and well-appearing.  He does not have fever.  He is tolerating oral intake.  He is handling his secretions and has normal phonation.  On exam uvula is midline, no midline swelling.  He has no anterior neck pain.  He is in the room drinking apple juice and requesting something to eat.  Will give first dose of antibiotic here.  Will give Decadron and viscous lidocaine to help with symptom management.  Unfortunately patient has reported anaphylaxis to penicillin and amoxicillin thus he is not a candidate for one-time dose of penicillin here.  Lungs clear to auscultation denies shortness of breath but does have history of asthma and requesting albuterol refill.  Will give albuterol inhaler here so he can take at home with him. I ordered medication including Decadron for symptom control, albuterol for asthma and medication refill, first dose  of antibiotic.  Reevaluation of the patient after these medicines showed that the patient improved Patients vitals assessed. Upon arrival patient is hemodynamically stable.  I have reviewed the patients home medicines and have made adjustments as needed     Plan:  F/u w/ PCP in 2-3d to ensure resolution of sx.  Patient was given return precautions. Patient stable for discharge at this time.  Patient educated on sx and dx and verbalized understanding of plan. Return to ER if new or worsening sx.          Final Clinical Impression(s) / ED Diagnoses Final diagnoses:  Strep  throat    Rx / DC Orders ED Discharge Orders     None         Smitty Knudsen, PA-C 08/01/23 1650    Terald Sleeper, MD 08/01/23 [phone]

## 2023-08-01 NOTE — Discharge Instructions (Signed)
 Take antibiotics as prescribed.   Warm tea with lemon and honey, use cough drops, stay well hydrated with water - alternate Pedialyte and Gatorade. Tylenol every 6 hours for muscle aches and very.   Return to ER with new or worsening symptoms.

## 2023-08-01 NOTE — ED Notes (Signed)
 Cab voucher c used  and called for pt

## 2024-05-29 ENCOUNTER — Encounter: Payer: Self-pay | Admitting: Physician Assistant

## 2024-05-29 ENCOUNTER — Ambulatory Visit: Admitting: Physician Assistant

## 2024-05-29 VITALS — BP 137/88 | HR 98

## 2024-05-29 DIAGNOSIS — J4540 Moderate persistent asthma, uncomplicated: Secondary | ICD-10-CM

## 2024-05-29 DIAGNOSIS — F3164 Bipolar disorder, current episode mixed, severe, with psychotic features: Secondary | ICD-10-CM

## 2024-05-29 DIAGNOSIS — F3170 Bipolar disorder, currently in remission, most recent episode unspecified: Secondary | ICD-10-CM

## 2024-05-29 MED ORDER — ALBUTEROL SULFATE HFA 108 (90 BASE) MCG/ACT IN AERS: 1.0000 | INHALATION_SPRAY | Freq: Four times a day (QID) | RESPIRATORY_TRACT | 1 refills | Status: AC | PRN

## 2024-05-29 MED ORDER — CETIRIZINE HCL 10 MG PO TABS: 10.0000 mg | ORAL_TABLET | Freq: Every day | ORAL | 11 refills | Status: AC

## 2024-05-29 MED ORDER — NEBULIZER SYSTEM ALL-IN-ONE MISC: 1.0000 | Freq: Three times a day (TID) | 0 refills | Status: AC | PRN

## 2024-05-29 MED ORDER — LURASIDONE HCL 40 MG PO TABS: 40.0000 mg | ORAL_TABLET | Freq: Every day | ORAL | 0 refills | Status: AC

## 2024-05-29 MED ORDER — ALBUTEROL SULFATE (2.5 MG/3ML) 0.083% IN NEBU: 2.5000 mg | INHALATION_SOLUTION | Freq: Four times a day (QID) | RESPIRATORY_TRACT | 12 refills | Status: AC | PRN

## 2024-05-29 NOTE — Progress Notes (Unsigned)
 "  New Patient Office Visit  Subjective    Patient ID: Raymond Church, male    DOB: 06-04-1990  Age: 34 y.o. MRN: [id number]  CC:  Chief Complaint  Patient presents with   Asthma    His asthma is bothering him and he needs refills   Discussed the use of AI scribe software for clinical note transcription with the patient, who gave verbal consent to proceed.  History of Present Illness   Raymond Church is a 34 year old male who presents for medication refills.  He needs refills of his asthma medications, especially albuterol , and his inhaler is empty. He has been limiting use due to low supply. A friend noticed loud breathing. He has had prior severe asthma exacerbations requiring emergency care and hospitalization, including lung collapse. He does not have a PCP and has relied on emergency services for asthma care. He does not have a nebulizer. Laughter, hair products, animal dander, and cats trigger his symptoms. He was diagnosed with a cat allergy last year and is not taking daily allergy medication.  He is also out of Latuda , which he finds helpful for social anxiety and for feeling more stable. He has not taken it since November 2025 and has previously used behavioral health services.  Adamantly denies any thoughts of self-harm  Outpatient Encounter Medications as of 05/29/2024  Medication Sig   cetirizine  (ZYRTEC  ALLERGY) 10 MG tablet Take 1 tablet (10 mg total) by mouth daily.   Nebulizer System All-In-One MISC 1 each by Does not apply route 3 (three) times daily as needed.   nicotine  (NICODERM CQ  - DOSED IN MG/24 HOURS) 21 mg/24hr patch Place 1 patch (21 mg total) onto the skin daily.   [DISCONTINUED] albuterol  (VENTOLIN  HFA) 108 (90 Base) MCG/ACT inhaler Inhale 1-2 puffs into the lungs every 6 (six) hours as needed for wheezing or shortness of breath.   [DISCONTINUED] lurasidone  (LATUDA ) 40 MG TABS tablet Take 1 tablet (40 mg total) by mouth daily with supper.   albuterol   (PROVENTIL ) (2.5 MG/3ML) 0.083% nebulizer solution Take 3 mLs (2.5 mg total) by nebulization every 6 (six) hours as needed for wheezing or shortness of breath.   albuterol  (VENTOLIN  HFA) 108 (90 Base) MCG/ACT inhaler Inhale 1-2 puffs into the lungs every 6 (six) hours as needed for wheezing or shortness of breath.   azithromycin  (ZITHROMAX ) 250 MG tablet Take 1 tablet (250 mg total) by mouth daily. Take first 2 tablets together, then 1 every day until finished. (Patient not taking: Reported on 05/29/2024)   lurasidone  (LATUDA ) 40 MG TABS tablet Take 1 tablet (40 mg total) by mouth daily with supper.   predniSONE  (DELTASONE ) 20 MG tablet Take 2 tablets (40 mg total) by mouth daily. (Patient not taking: Reported on 05/29/2024)   traZODone  (DESYREL ) 50 MG tablet Take 1 tablet (50 mg total) by mouth at bedtime as needed for sleep. (Patient not taking: Reported on 05/29/2024)   [DISCONTINUED] albuterol  (PROVENTIL ) (2.5 MG/3ML) 0.083% nebulizer solution Take 3 mLs (2.5 mg total) by nebulization every 6 (six) hours as needed for wheezing or shortness of breath. (Patient not taking: Reported on 05/29/2024)   No facility-administered encounter medications on file as of 05/29/2024.    Past Medical History:  Diagnosis Date   Asthma    Bipolar disorder (HCC)    Depression    Pneumonia     Past Surgical History:  Procedure Laterality Date   NO PAST SURGERIES      Family History  Problem Relation Age of Onset   Bipolar disorder Mother    Drug abuse Mother     Social History   Socioeconomic History   Marital status: Single    Spouse name: Not on file   Number of children: Not on file   Years of education: Not on file   Highest education level: Not on file  Occupational History   Not on file  Tobacco Use   Smoking status: Every Day    Current packs/day: 0.25    Average packs/day: 0.3 packs/day for 10.0 years (2.5 ttl pk-yrs)    Types: Cigarettes   Smokeless tobacco: Never   Tobacco comments:     refused  Vaping Use   Vaping status: Never Used  Substance and Sexual Activity   Alcohol use: Never    Comment: denies   Drug use: Yes    Types: Marijuana, Methamphetamines    Comment: currently using   Sexual activity: Yes  Other Topics Concern   Not on file  Social History Narrative   Not on file   Social Drivers of Health   Tobacco Use: High Risk (05/29/2024)   Patient History    Smoking Tobacco Use: Every Day    Smokeless Tobacco Use: Never    Passive Exposure: Not on file  Financial Resource Strain: Not on file  Food Insecurity: No Food Insecurity (05/02/2022)   Hunger Vital Sign    Worried About Running Out of Food in the Last Year: Never true    Ran Out of Food in the Last Year: Never true  Transportation Needs: No Transportation Needs (05/02/2022)   PRAPARE - Administrator, Civil Service (Medical): No    Lack of Transportation (Non-Medical): No  Physical Activity: Not on file  Stress: Not on file  Social Connections: Not on file  Intimate Partner Violence: Not At Risk (05/02/2022)   Humiliation, Afraid, Rape, and Kick questionnaire    Fear of Current or Ex-Partner: No    Emotionally Abused: No    Physically Abused: No    Sexually Abused: No  Depression (PHQ2-9): Not on file  Alcohol Screen: Low Risk (05/02/2022)   Alcohol Screen    Last Alcohol Screening Score (AUDIT): 0  Housing: Low Risk (05/02/2022)   Housing    Last Housing Risk Score: 0  Utilities: Not At Risk (05/02/2022)   AHC Utilities    Threatened with loss of utilities: No  Health Literacy: Not on file    Review of Systems  Constitutional:  Negative for chills and fever.  HENT: Negative.    Eyes: Negative.   Respiratory:  Positive for shortness of breath. Negative for cough.   Cardiovascular:  Negative for chest pain.  Gastrointestinal: Negative.   Genitourinary: Negative.   Musculoskeletal: Negative.   Skin: Negative.   Neurological: Negative.   Endo/Heme/Allergies: Negative.    Psychiatric/Behavioral: Negative.          Objective    BP 137/88 (BP Location: Left Arm, Patient Position: Sitting)   Pulse 98   SpO2 95%   Physical Exam Vitals and nursing note reviewed.    GENERAL: Alert, cooperative, well developed, no acute distress HEENT: Normocephalic, normal oropharynx, moist mucous membranes CHEST: Clear to auscultation bilaterally, no wheezes, rhonchi, or crackles CARDIOVASCULAR: Heart regular, rate slightly elevated, S1 and S2 normal without murmurs EXTREMITIES: No cyanosis or edema NEUROLOGICAL: Cranial nerves grossly intact, moves all extremities without gross motor or sensory deficit    Assessment & Plan:   Problem  List Items Addressed This Visit       Other   Bipolar disorder, curr episode mixed, severe, with psychotic features (HCC)   Relevant Medications   lurasidone  (LATUDA ) 40 MG TABS tablet   Other Visit Diagnoses       Moderate persistent asthma without complication    -  Primary   Relevant Medications   albuterol  (VENTOLIN  HFA) 108 (90 Base) MCG/ACT inhaler   albuterol  (PROVENTIL ) (2.5 MG/3ML) 0.083% nebulizer solution   Nebulizer System All-In-One MISC   cetirizine  (ZYRTEC  ALLERGY) 10 MG tablet        Assessment and Plan Moderate persistent asthma Exacerbated by laughter, short hair, and animal dander. Recent severe exacerbation required emergency care. Current albuterol  inhaler is depleted. No nebulizer available.  Patient given appointment to establish care at patient care.  Red flags given for prompt reevaluation - Prescribed albuterol  inhaler. - Prescribed nebulizer and solution. - Advised daily use of Zyrtec  to manage allergies and prevent asthma exacerbations.  Bipolar disorder, current episode mixed, severe, with psychotic features Latuda  previously effective for social anxiety. Currently out of Latuda  since November. Behavioral health urgent care previously provided temporary management. - Prescribed  Latuda . - Advised to visit behavioral health urgent care for ongoing management and prescription continuity.  Patient understands and agrees   I have reviewed the patient's medical history (PMH, PSH, Social History, Family History, Medications, and allergies) , and have been updated if relevant. I spent 30 minutes reviewing chart and  face to face time with patient.     Return if symptoms worsen or fail to improve.   Kirk RAMAN Mayers, PA-C   "

## 2024-05-30 NOTE — Patient Instructions (Addendum)
 VISIT SUMMARY:  During your visit, we discussed your need for medication refills for asthma and bipolar disorder. We also addressed your allergies, which are contributing to your asthma symptoms. You were prescribed medications to help manage these conditions and given advice on how to prevent future exacerbations.  YOUR PLAN:  -MODERATE PERSISTENT ASTHMA: Moderate persistent asthma is a condition where the airways in your lungs are inflamed and narrowed, causing difficulty in breathing. Your asthma is triggered by laughter, hair products, and animal dander. You were prescribed an albuterol  inhaler and a nebulizer with solution to help manage your symptoms. Additionally, you should take Zyrtec  daily to manage your allergies and prevent asthma exacerbations.  -BIPOLAR DISORDER, CURRENT EPISODE MIXED, SEVERE, WITH PSYCHOTIC FEATURES: Bipolar disorder is a mental health condition that causes extreme mood swings, including emotional highs and lows. You have been experiencing a severe mixed episode with psychotic features. Latuda , which you found helpful in the past, has been prescribed to help manage your symptoms. You should visit behavioral health urgent care for ongoing management and to ensure you have a continuous supply of your medication.  [facility] [address], [place], [place] [address] [phone] or [phone] Walk-in urgent care 24/7 for anyone  For [facility] ONLY New patient assessments and therapy walk-ins: Monday and Wednesday 8am-11am First and second Friday 1pm-5pm New patient psychiatry and medication management walk-ins: Mondays, Wednesdays, Thursdays, Fridays 8am-11am No psychiatry walk-ins on Tuesday    -ALLERGIC RHINITIS: Allergic rhinitis is an allergic reaction that causes sneezing, congestion, and a runny nose. Your allergies to cats and other allergens are contributing to your asthma symptoms. You were prescribed Zyrtec  for  daily use to help manage these allergies.  INSTRUCTIONS:  Please follow up with behavioral health urgent care for ongoing management of your bipolar disorder and to ensure you have a continuous supply of Latuda . Continue using your albuterol  inhaler and nebulizer as prescribed, and take Zyrtec  daily to manage your allergies and prevent asthma exacerbations.

## 2024-06-21 ENCOUNTER — Ambulatory Visit: Payer: Self-pay | Admitting: Nurse Practitioner
